# Patient Record
Sex: Male | Born: 1960 | Race: White | Hispanic: No | Marital: Married | Smoking: Never smoker
Health system: Southern US, Community
[De-identification: ages and names within clinical notes are randomized; demographics above are authoritative.]

## PROBLEM LIST (undated history)

## (undated) DIAGNOSIS — H9193 Unspecified hearing loss, bilateral: Secondary | ICD-10-CM

## (undated) DIAGNOSIS — K759 Inflammatory liver disease, unspecified: Secondary | ICD-10-CM

## (undated) DIAGNOSIS — I4901 Ventricular fibrillation: Secondary | ICD-10-CM

## (undated) DIAGNOSIS — I119 Hypertensive heart disease without heart failure: Secondary | ICD-10-CM

## (undated) DIAGNOSIS — R001 Bradycardia, unspecified: Secondary | ICD-10-CM

## (undated) DIAGNOSIS — K760 Fatty (change of) liver, not elsewhere classified: Secondary | ICD-10-CM

## (undated) DIAGNOSIS — I2101 ST elevation (STEMI) myocardial infarction involving left main coronary artery: Secondary | ICD-10-CM

## (undated) DIAGNOSIS — I10 Essential (primary) hypertension: Secondary | ICD-10-CM

## (undated) DIAGNOSIS — G4733 Obstructive sleep apnea (adult) (pediatric): Secondary | ICD-10-CM

## (undated) DIAGNOSIS — C801 Malignant (primary) neoplasm, unspecified: Secondary | ICD-10-CM

## (undated) DIAGNOSIS — T8859XA Other complications of anesthesia, initial encounter: Secondary | ICD-10-CM

## (undated) DIAGNOSIS — F419 Anxiety disorder, unspecified: Secondary | ICD-10-CM

## (undated) DIAGNOSIS — E119 Type 2 diabetes mellitus without complications: Secondary | ICD-10-CM

## (undated) DIAGNOSIS — Z87442 Personal history of urinary calculi: Secondary | ICD-10-CM

## (undated) DIAGNOSIS — E785 Hyperlipidemia, unspecified: Secondary | ICD-10-CM

## (undated) HISTORY — DX: Obstructive sleep apnea (adult) (pediatric): G47.33

## (undated) HISTORY — DX: Type 2 diabetes mellitus without complications: E11.9

## (undated) HISTORY — DX: Unspecified hearing loss, bilateral: H91.93

## (undated) HISTORY — PX: HERNIA REPAIR: SHX51

## (undated) HISTORY — DX: Essential (primary) hypertension: I10

## (undated) HISTORY — DX: Hypertensive heart disease without heart failure: I11.9

## (undated) HISTORY — DX: Hyperlipidemia, unspecified: E78.5

## (undated) HISTORY — DX: Bradycardia, unspecified: R00.1

## (undated) HISTORY — DX: Ventricular fibrillation: I49.01

## (undated) HISTORY — PX: OTHER SURGICAL HISTORY: SHX169

## (undated) HISTORY — DX: ST elevation (STEMI) myocardial infarction involving left main coronary artery: I21.01

---

## 1998-05-30 HISTORY — PX: NASAL SINUS SURGERY: SHX719

## 2007-10-10 ENCOUNTER — Encounter: Admission: RE | Admit: 2007-10-10 | Discharge: 2007-10-10 | Payer: Self-pay | Admitting: Emergency Medicine

## 2008-12-19 IMAGING — CR DG ANKLE COMPLETE 3+V*R*
3 series · 3 of 3 positions shown · non-contrast
Comparison: None

CLINICAL DATA: 2-week history of pain.

RIGHT ANKLE - COMPLETE 3+ VIEW

[t ankle joint ap right]
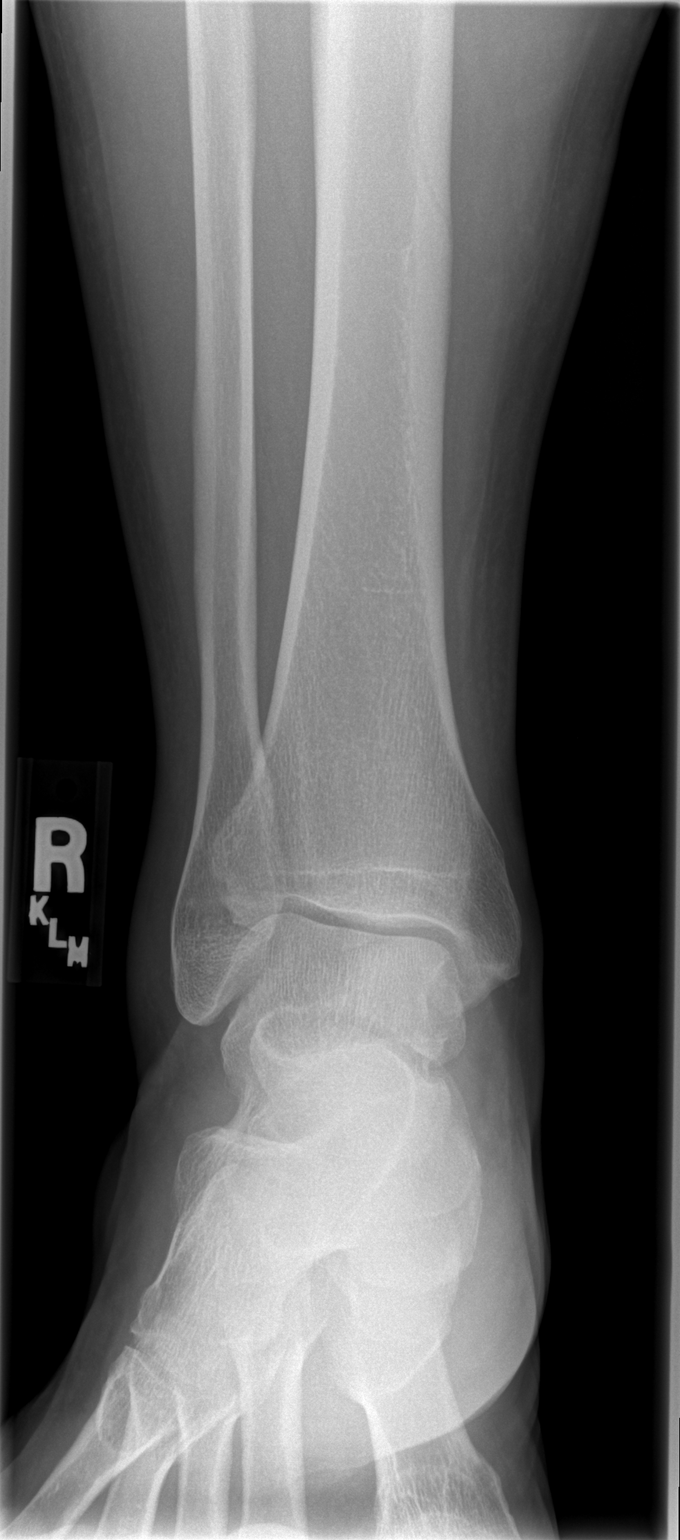

[t ankle joint oblique right]
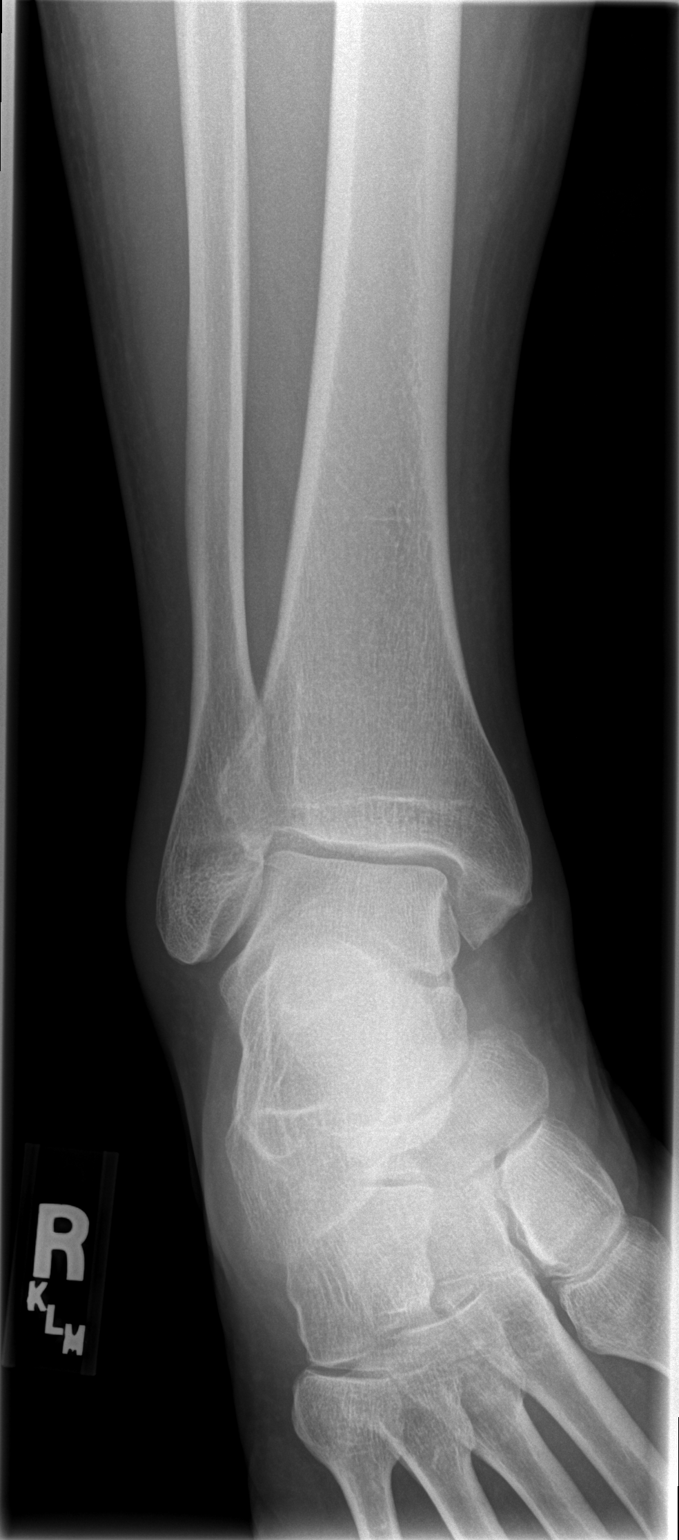

[t ankle joint lat right]
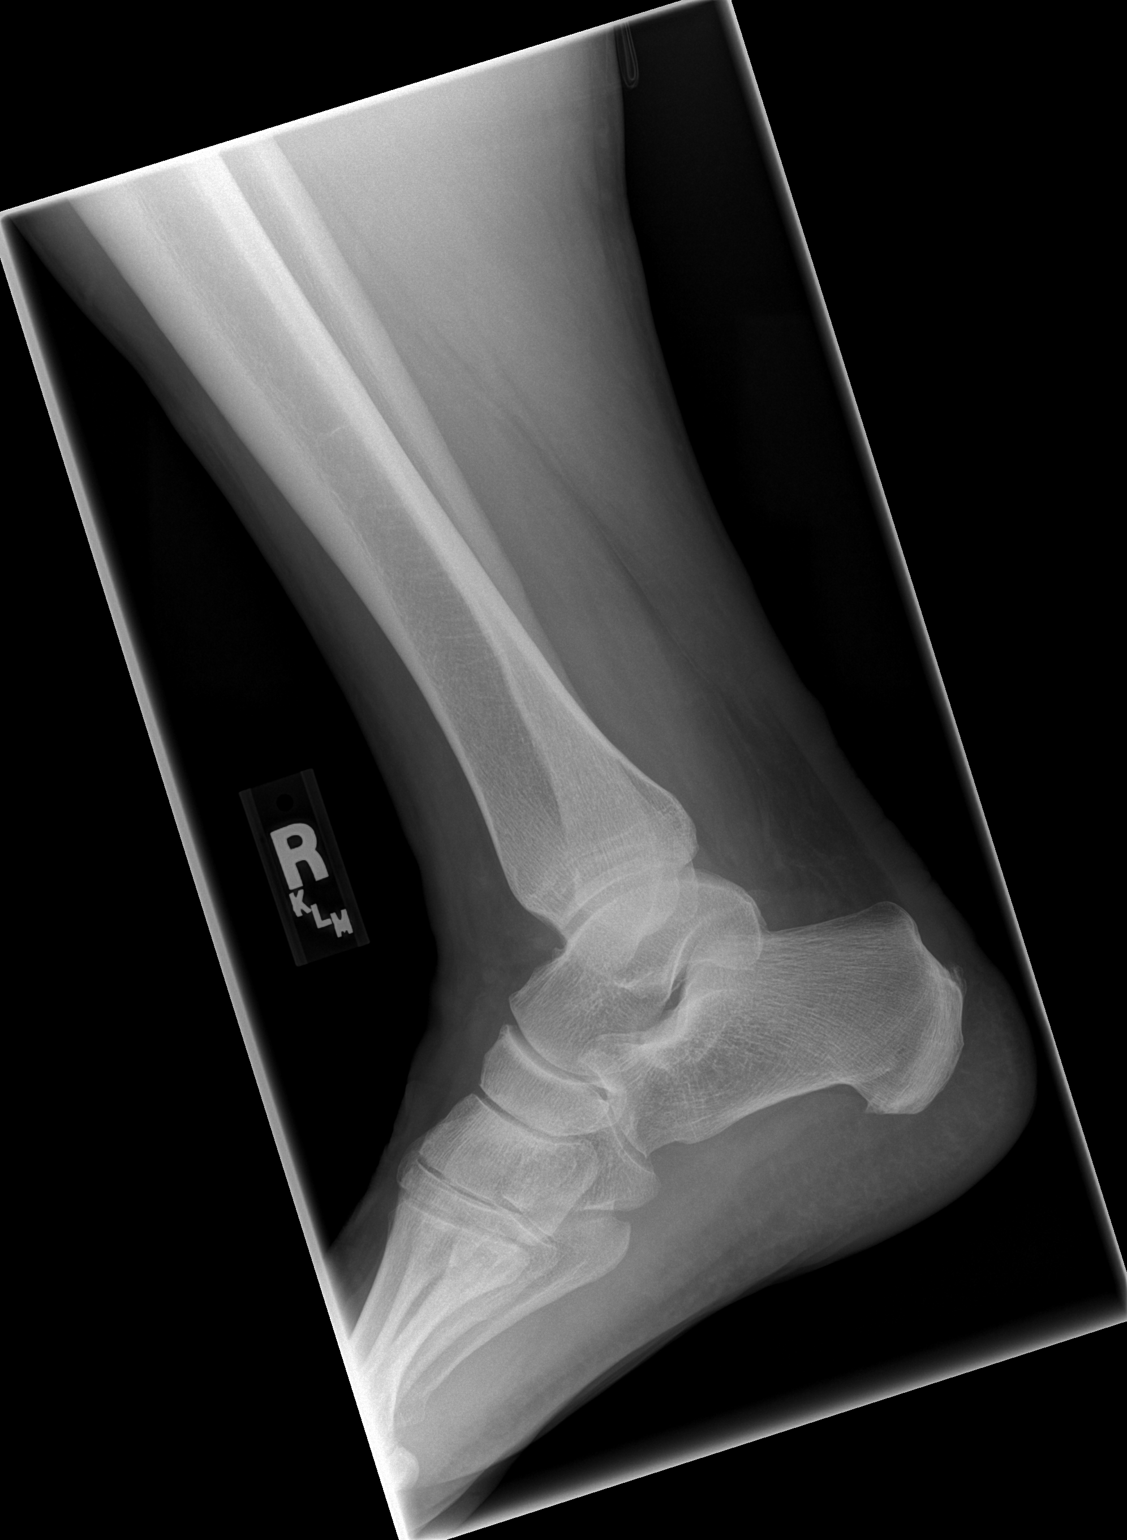

[3 of 3 positions shown; findings below may reference images not displayed]

FINDINGS: No evidence for acute fracture.  No subluxation or
dislocation.  Ankle mortise is preserved.  Mild soft tissue
swelling is evident.
IMPRESSION: No acute bony findings.

## 2011-04-19 ENCOUNTER — Telehealth: Payer: Self-pay | Admitting: *Deleted

## 2011-04-19 ENCOUNTER — Ambulatory Visit (AMBULATORY_SURGERY_CENTER): Payer: Managed Care, Other (non HMO) | Admitting: *Deleted

## 2011-04-19 DIAGNOSIS — Z1211 Encounter for screening for malignant neoplasm of colon: Secondary | ICD-10-CM

## 2011-04-19 NOTE — Telephone Encounter (Signed)
Pt arrived for PV today for screening colonoscopy with Dr. Christella Hartigan.  Pt was seen by GI doctor 4 months ago in [place], [place].  I explained our policy about pt's that have seen another GI MD within the last year.  Pt wants to come to our office because his wife is more familiar with [place] than [place].  Pt will decide if he wants to still switch to our practice after talking to his wife.  He will come to our office and sign release for records for MD in [place] after he makes his decision.  I explained that Dr. Christella Hartigan would have to review records and agree to take him as a patient. [name]

## 2011-05-06 ENCOUNTER — Other Ambulatory Visit: Payer: Self-pay | Admitting: Gastroenterology

## 2013-05-02 ENCOUNTER — Ambulatory Visit: Payer: Managed Care, Other (non HMO) | Admitting: Cardiology

## 2014-05-27 ENCOUNTER — Encounter: Payer: Self-pay | Admitting: *Deleted

## 2014-05-30 HISTORY — PX: OTHER SURGICAL HISTORY: SHX169

## 2016-02-28 HISTORY — PX: CHOLECYSTECTOMY: SHX55

## 2017-05-30 DIAGNOSIS — I2510 Atherosclerotic heart disease of native coronary artery without angina pectoris: Secondary | ICD-10-CM

## 2017-05-30 HISTORY — DX: Atherosclerotic heart disease of native coronary artery without angina pectoris: I25.10

## 2017-05-30 HISTORY — PX: CARDIAC CATHETERIZATION: SHX172

## 2017-08-12 DIAGNOSIS — I252 Old myocardial infarction: Secondary | ICD-10-CM | POA: Insufficient documentation

## 2017-08-12 HISTORY — DX: Old myocardial infarction: I25.2

## 2017-08-13 DIAGNOSIS — I25118 Atherosclerotic heart disease of native coronary artery with other forms of angina pectoris: Secondary | ICD-10-CM | POA: Insufficient documentation

## 2019-07-07 DIAGNOSIS — R55 Syncope and collapse: Secondary | ICD-10-CM | POA: Diagnosis not present

## 2019-07-07 DIAGNOSIS — R079 Chest pain, unspecified: Secondary | ICD-10-CM | POA: Diagnosis not present

## 2019-07-29 ENCOUNTER — Other Ambulatory Visit: Payer: Self-pay

## 2019-07-30 ENCOUNTER — Other Ambulatory Visit: Payer: Self-pay

## 2019-07-30 MED ORDER — CARVEDILOL 6.25 MG PO TABS: 6.2500 mg | ORAL_TABLET | Freq: Two times a day (BID) | ORAL | 0 refills | Status: DC

## 2019-08-01 ENCOUNTER — Other Ambulatory Visit: Payer: Self-pay

## 2019-08-01 MED ORDER — CARVEDILOL 6.25 MG PO TABS: 6.2500 mg | ORAL_TABLET | Freq: Two times a day (BID) | ORAL | 0 refills | Status: DC

## 2019-08-05 ENCOUNTER — Other Ambulatory Visit: Payer: Self-pay

## 2019-08-05 ENCOUNTER — Ambulatory Visit (INDEPENDENT_AMBULATORY_CARE_PROVIDER_SITE_OTHER): Payer: Managed Care, Other (non HMO) | Admitting: Family Medicine

## 2019-08-05 ENCOUNTER — Encounter: Payer: Self-pay | Admitting: Family Medicine

## 2019-08-05 VITALS — BP 118/70 | HR 56 | Temp 96.2°F | Resp 16 | Ht 69.0 in | Wt 266.0 lb

## 2019-08-05 DIAGNOSIS — F330 Major depressive disorder, recurrent, mild: Secondary | ICD-10-CM

## 2019-08-05 DIAGNOSIS — E782 Mixed hyperlipidemia: Secondary | ICD-10-CM | POA: Insufficient documentation

## 2019-08-05 DIAGNOSIS — I119 Hypertensive heart disease without heart failure: Secondary | ICD-10-CM | POA: Diagnosis not present

## 2019-08-05 DIAGNOSIS — E1121 Type 2 diabetes mellitus with diabetic nephropathy: Secondary | ICD-10-CM | POA: Diagnosis not present

## 2019-08-05 DIAGNOSIS — E66812 Obesity, class 2: Secondary | ICD-10-CM

## 2019-08-05 DIAGNOSIS — Z6837 Body mass index (BMI) 37.0-37.9, adult: Secondary | ICD-10-CM | POA: Insufficient documentation

## 2019-08-05 DIAGNOSIS — E038 Other specified hypothyroidism: Secondary | ICD-10-CM

## 2019-08-05 DIAGNOSIS — I10 Essential (primary) hypertension: Secondary | ICD-10-CM | POA: Insufficient documentation

## 2019-08-05 DIAGNOSIS — Z6839 Body mass index (BMI) 39.0-39.9, adult: Secondary | ICD-10-CM

## 2019-08-05 HISTORY — DX: Morbid (severe) obesity due to excess calories: E66.01

## 2019-08-05 NOTE — Assessment & Plan Note (Signed)
Well controlled.  ?No changes to medicines.  ?Continue to work on eating a healthy diet and exercise.  ?Labs drawn today.  ?

## 2019-08-05 NOTE — Patient Instructions (Addendum)
Well controlled.  No changes to medicines.  Work on eating a healthy diet and exercise. Start back on Noom! Continue to check sugars daily Follow up for shingrix vaccinee in 08/2019. Labs drawn today.   Mixed hyperlipidemia Well controlled.  No changes to medicines.  Continue to work on eating a healthy diet and exercise.  Labs drawn today.

## 2019-08-05 NOTE — Progress Notes (Addendum)
Subjective:  Patient ID: Gerald Jordan, male    DOB: Oct 25, 1960  Age: 59 y.o. MRN: [id number]  Chief Complaint  Patient presents with  . Diabetes  . Hypothyroidism  . Hyperlipidemia    Diabetes He presents for his follow-up diabetic visit. He has type 2 diabetes mellitus. His disease course has been stable. Pertinent negatives for hypoglycemia include no dizziness, headaches or nervousness/anxiousness. Pertinent negatives for diabetes include no chest pain, no fatigue, no polydipsia, no polyphagia and no polyuria.  Hyperlipidemia This is a chronic problem. The current episode started more than 1 year ago. The problem is controlled. Exacerbating diseases include diabetes and hypothyroidism. Pertinent negatives include no chest pain or shortness of breath.    Diabetes: checking sugars daily.  Coronary artery disease treated with stent, statin, betablocker, and aspirin.  Patient was admitted to [facility] in early February for a near syncopal episode. CT Brain normal, Carotid dopplers normal, echo normal. Stroke was ruled out. He has had no further symptoms.  Social History   Socioeconomic History  . Marital status: Married    Spouse name: Not on file  . Number of children: Not on file  . Years of education: Not on file  . Highest education level: Not on file  Occupational History  . Not on file  Tobacco Use  . Smoking status: Never Smoker  . Smokeless tobacco: Never Used  Substance and Sexual Activity  . Alcohol use: Not Currently  . Drug use: No  . Sexual activity: Not on file  Other Topics Concern  . Not on file  Social History Narrative  . Not on file   Social Determinants of Health   Financial Resource Strain:   . Difficulty of Paying Living Expenses:   Food Insecurity:   . Worried About Charity fundraiser in the Last Year:   . Arboriculturist in the Last Year:   Transportation Needs:   . Film/video editor (Medical):   Marland Kitchen Lack of Transportation  (Non-Medical):   Physical Activity:   . Days of Exercise per Week:   . Minutes of Exercise per Session:   Stress:   . Feeling of Stress :   Social Connections:   . Frequency of Communication with Friends and Family:   . Frequency of Social Gatherings with Friends and Family:   . Attends Religious Services:   . Active Member of Clubs or Organizations:   . Attends Archivist Meetings:   Marland Kitchen Marital Status:    Past Medical History:  Diagnosis Date  . Atherosclerotic heart disease of native coronary artery without angina pectoris   . Bradycardia   . Hyperlipidemia   . Hypertension   . Hypertensive heart disease without heart failure   . Morbid obesity ([place]) 08/05/2019  . 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 Obstructive sleep apnea   . Type 2 diabetes mellitus ([place])   . Unspecified hearing loss, bilateral   . Ventricular fibrillation (HCC)    Family History  Problem Relation Age of Onset  . Breast cancer Mother   . Melanoma Father   . Diabetes Sister   . Arrhythmia Brother   . Congestive Heart Failure Brother   . Heart attack Brother     Review of Systems  Constitutional: Negative for chills, fatigue and fever.  HENT: Negative for congestion, rhinorrhea and sore throat.   Respiratory: Negative for cough, chest tightness and shortness of breath.   Cardiovascular:  Negative for chest pain.  Gastrointestinal: Negative for abdominal pain, constipation, diarrhea, nausea and vomiting.  Endocrine: Negative for polydipsia, polyphagia and polyuria.  Genitourinary: Negative for dysuria and urgency.  Neurological: Negative for dizziness and headaches.  Psychiatric/Behavioral: Positive for dysphoric mood. The patient is not nervous/anxious.        His brother in law passed. His best friend passed from [place] 19. He does not wish to change his medicines.     Objective:  BP 118/70   Pulse (!) 56   Temp (!) 96.2 F (35.7 C)   Resp 16   Ht 5\' 9"   (1.753 m)   Wt 266 lb (120.7 kg)   BMI 39.28 kg/m   BP/Weight [id number]  Systolic BP 123456  Diastolic BP 70  Wt. (Lbs) 266  BMI 39.28    Physical Exam Vitals reviewed.  Constitutional:      Appearance: Normal appearance.  Neck:     Vascular: No carotid bruit.  Cardiovascular:     Rate and Rhythm: Normal rate and regular rhythm.     Pulses: Normal pulses.     Heart sounds: Normal heart sounds.  Pulmonary:     Effort: Pulmonary effort is normal.     Breath sounds: Normal breath sounds. No wheezing, rhonchi or rales.  Abdominal:     General: Bowel sounds are normal.     Palpations: Abdomen is soft.     Tenderness: There is no abdominal tenderness.  Neurological:     Mental Status: He is alert.  Psychiatric:        Mood and Affect: Mood normal.        Behavior: Behavior normal.    Diabetic Foot Exam - Simple   Simple Foot Form Diabetic Foot exam was performed with the following findings: Yes 08/05/2019 12:55 PM  Visual Inspection No deformities, no ulcerations, no other skin breakdown bilaterally: Yes Sensation Testing Intact to touch and monofilament testing bilaterally: Yes Pulse Check Posterior Tibialis and Dorsalis pulse intact bilaterally: Yes Comments    Fall Risk  08/05/2019  Falls in the past year? 0  Number falls in past yr: 0  Injury with Fall? 0   Lab Results  Component Value Date   WBC 7.0 08/05/2019   HGB 15.3 08/05/2019   HCT 45.0 08/05/2019   PLT 165 08/05/2019   GLUCOSE 111 (H) 08/05/2019   CHOL 97 (L) 08/05/2019   TRIG 72 08/05/2019   HDL 41 08/05/2019   LDLCALC 41 08/05/2019   AST 28 08/05/2019   NA 141 08/05/2019   K 4.5 08/05/2019   CL 100 08/05/2019   CREATININE 0.93 08/05/2019   BUN 11 08/05/2019   TSH 2.540 08/05/2019   HGBA1C 5.8 (H) 08/05/2019      Assessment & Plan:   1. Mixed hyperlipidemia Well controlled.  No changes to medicines.  Continue to work on eating a healthy diet and exercise.  Labs drawn today.  - Lipid  panel  2. Hypertensive heart disease without heart failure Well controlled.  No changes to medicines.  Continue to work on eating a healthy diet and exercise.  Labs drawn today.  - CBC with Differential/Platelet - Comp. Metabolic Panel (12) Follow up with cardiology regularly. 3. 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 Recommend check feet daily. Recommend annual eye exams. Medicines: none Continue to work on eating a healthy diet and exercise.  Labs drawn today.   - Hemoglobin A1c  4. Secondary hypothyroidism The current medical regimen  is effective;  continue present plan and medications. - TSH  5. Class 2 severe obesity due to excess calories with serious comorbidity and body mass index (BMI) of 39.0 to 39.9 in adult [facility]) Needs improvement.   Continue to work on eating a healthy diet and exercise.   6. Mild recurrent major depression. No changes to medications recommended. Pt is usually well controlled, but has recently been complicated by grief due to death of friends/relatives.   Follow-up: Return in about 3 months (around 11/05/2019) for chronic fasting appointment. May wish to schedule shingrix vaccine April.  AVS was given to patient prior to departure.  [facility] [phone]

## 2019-08-07 LAB — COMP. METABOLIC PANEL (12)
AST: 28 IU/L (ref 0–40)
Albumin/Globulin Ratio: 3.2 — ABNORMAL HIGH (ref 1.2–2.2)
Albumin: 4.8 g/dL (ref 3.8–4.9)
Alkaline Phosphatase: 74 IU/L (ref 39–117)
BUN/Creatinine Ratio: 12 (ref 9–20)
BUN: 11 mg/dL (ref 6–24)
Bilirubin Total: 0.8 mg/dL (ref 0.0–1.2)
Calcium: 9.6 mg/dL (ref 8.7–10.2)
Chloride: 100 mmol/L (ref 96–106)
Creatinine, Ser: 0.93 mg/dL (ref 0.76–1.27)
GFR calc Af Amer: 104 mL/min/{1.73_m2} (ref >59)
GFR calc non Af Amer: 90 mL/min/{1.73_m2} (ref >59)
Globulin, Total: 1.5 g/dL (ref 1.5–4.5)
Glucose: 111 mg/dL — ABNORMAL HIGH (ref 65–99)
Potassium: 4.5 mmol/L (ref 3.5–5.2)
Sodium: 141 mmol/L (ref 134–144)
Total Protein: 6.3 g/dL (ref 6.0–8.5)

## 2019-08-07 LAB — CBC WITH DIFFERENTIAL/PLATELET
Basophils Absolute: 0 10*3/uL (ref 0.0–0.2)
Basos: 0 %
EOS (ABSOLUTE): 0.1 10*3/uL (ref 0.0–0.4)
Eos: 1 %
Hematocrit: 45 % (ref 37.5–51.0)
Hemoglobin: 15.3 g/dL (ref 13.0–17.7)
Immature Grans (Abs): 0 10*3/uL (ref 0.0–0.1)
Immature Granulocytes: 1 %
Lymphocytes Absolute: 2.5 10*3/uL (ref 0.7–3.1)
Lymphs: 36 %
MCH: 31 pg (ref 26.6–33.0)
MCHC: 34 g/dL (ref 31.5–35.7)
MCV: 91 fL (ref 79–97)
Monocytes Absolute: 0.8 10*3/uL (ref 0.1–0.9)
Monocytes: 11 %
Neutrophils Absolute: 3.6 10*3/uL (ref 1.4–7.0)
Neutrophils: 51 %
Platelets: 165 10*3/uL (ref 150–450)
RBC: 4.93 x10E6/uL (ref 4.14–5.80)
RDW: 12.3 % (ref 11.6–15.4)
WBC: 7 10*3/uL (ref 3.4–10.8)

## 2019-08-07 LAB — LIPID PANEL
Chol/HDL Ratio: 2.4 ratio (ref 0.0–5.0)
Cholesterol, Total: 97 mg/dL — ABNORMAL LOW (ref 100–199)
HDL: 41 mg/dL (ref >39)
LDL Chol Calc (NIH): 41 mg/dL (ref 0–99)
Triglycerides: 72 mg/dL (ref 0–149)
VLDL Cholesterol Cal: 15 mg/dL (ref 5–40)

## 2019-08-07 LAB — CARDIOVASCULAR RISK ASSESSMENT

## 2019-08-07 LAB — HEMOGLOBIN A1C
Est. average glucose Bld gHb Est-mCnc: 120 mg/dL
Hgb A1c MFr Bld: 5.8 % — ABNORMAL HIGH (ref 4.8–5.6)

## 2019-08-07 LAB — TSH: TSH: 2.54 u[IU]/mL (ref 0.450–4.500)

## 2019-08-25 ENCOUNTER — Encounter: Payer: Self-pay | Admitting: Family Medicine

## 2019-08-25 DIAGNOSIS — F3181 Bipolar II disorder: Secondary | ICD-10-CM | POA: Insufficient documentation

## 2019-08-25 DIAGNOSIS — E079 Disorder of thyroid, unspecified: Secondary | ICD-10-CM | POA: Insufficient documentation

## 2019-08-25 DIAGNOSIS — K760 Fatty (change of) liver, not elsewhere classified: Secondary | ICD-10-CM | POA: Insufficient documentation

## 2019-08-25 DIAGNOSIS — F330 Major depressive disorder, recurrent, mild: Secondary | ICD-10-CM | POA: Insufficient documentation

## 2019-08-25 DIAGNOSIS — G4733 Obstructive sleep apnea (adult) (pediatric): Secondary | ICD-10-CM | POA: Insufficient documentation

## 2019-08-26 ENCOUNTER — Other Ambulatory Visit: Payer: Self-pay | Admitting: Family Medicine

## 2019-08-29 ENCOUNTER — Other Ambulatory Visit: Payer: Self-pay | Admitting: Family Medicine

## 2019-09-03 ENCOUNTER — Telehealth (INDEPENDENT_AMBULATORY_CARE_PROVIDER_SITE_OTHER): Payer: Managed Care, Other (non HMO) | Admitting: Nurse Practitioner

## 2019-09-03 ENCOUNTER — Encounter: Payer: Self-pay | Admitting: Nurse Practitioner

## 2019-09-03 VITALS — BP 125/64 | Temp 100.0°F | Ht 69.0 in

## 2019-09-03 DIAGNOSIS — J0100 Acute maxillary sinusitis, unspecified: Secondary | ICD-10-CM | POA: Insufficient documentation

## 2019-09-03 DIAGNOSIS — J018 Other acute sinusitis: Secondary | ICD-10-CM | POA: Diagnosis not present

## 2019-09-03 DIAGNOSIS — J019 Acute sinusitis, unspecified: Secondary | ICD-10-CM | POA: Insufficient documentation

## 2019-09-03 MED ORDER — ACETAMINOPHEN 500 MG PO TABS: 500.0000 mg | ORAL_TABLET | Freq: Four times a day (QID) | ORAL | 0 refills | Status: DC | PRN

## 2019-09-03 MED ORDER — SALINE SPRAY 0.65 % NA SOLN: 1.0000 | NASAL | 1 refills | Status: DC | PRN

## 2019-09-03 MED ORDER — DM-GUAIFENESIN ER 30-600 MG PO TB12: 1.0000 | ORAL_TABLET | Freq: Two times a day (BID) | ORAL | 0 refills | Status: DC

## 2019-09-03 MED ORDER — AZITHROMYCIN 500 MG PO TABS: 500.0000 mg | ORAL_TABLET | Freq: Every day | ORAL | 0 refills | Status: DC

## 2019-09-03 NOTE — Assessment & Plan Note (Addendum)
patients sinus infection is not well controlled . Azithromycin 500 mg daily started for sinus infection, ocean nasal spray  for nasal congestion, tylenol for low grade fever. Pt knows to call with worsening symptoms.

## 2019-09-03 NOTE — Progress Notes (Addendum)
Virtual Visit via Telephone Note   This visit type was conducted due to national recommendations for restrictions regarding the COVID-19 Pandemic (e.g. social distancing) in an effort to limit this patient's exposure and mitigate transmission in our community.  Due to his co-morbid illnesses, this patient is at least at moderate risk for complications without adequate follow up.  This format is felt to be most appropriate for this patient at this time.  The patient did not have access to video technology/had technical difficulties with video requiring transitioning to audio format only (telephone).  All issues noted in this document were discussed and addressed.  No physical exam could be performed with this format.  Patient verbally consented to a telehealth visit.   Date:  09/18/2019   ID:  Gerald Jordan, DOB 09/10/1960, MRN [id number]   Patient location: in the car Provider location: office  PCP:  Rochel Brome, MD   Evaluation Performed:  Established patient encounter  Chief Complaint: Patient  is a 59 year old male.  He is evaluated for sinus infection.The patient's medications were reviewed and reconciled since the patient's last visit.  History details were provided by the patient. The history appears to be reliable.    History of Present Illness:    Gerald Jordan is a 59 y.o. male with Sinusitis This is a recurrent problem. The current episode started in the past 7 days. The problem has been gradually worsening since onset. There has been no fever. His pain is at a severity of 5/10. The pain is moderate. Associated symptoms include congestion, coughing, headaches, sinus pressure and a sore throat. Pertinent negatives include no chills, diaphoresis, ear pain, hoarse voice, neck pain, shortness of breath, sneezing or swollen glands. Past treatments include oral decongestants. The treatment provided mild relief.   The patient doesn't have Covid-19 he received his second covid-19  vaccine march 15th   Past Medical History:  Diagnosis Date  . Atherosclerotic heart disease of native coronary artery without angina pectoris 2019   Ventricular fibrillation - cardioverted x 3 in ambulance. PTCA LAD 100%  . Bradycardia   . Hyperlipidemia   . Hypertension   . Hypertensive heart disease without heart failure   . Morbid obesity ([place]) 08/05/2019  . 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 Obstructive sleep apnea   . Type 2 diabetes mellitus ([place])   . Unspecified hearing loss, bilateral   . Ventricular fibrillation [facility])    Past Surgical History:  Procedure Laterality Date  . arm surgery  05/2014  . CHOLECYSTECTOMY  02/2016  . HERNIA REPAIR     1999  . NASAL SINUS SURGERY  2000  . tendon repair 11/2014      Family History  Problem Relation Age of Onset  . Breast cancer Mother   . Melanoma Father   . Diabetes Sister   . Arrhythmia Brother   . Congestive Heart Failure Brother   . Heart attack Brother     Current Meds  Medication Sig  . aspirin 81 MG tablet Take 81 mg by mouth daily.  Marland Kitchen atorvastatin (LIPITOR) 80 MG tablet TAKE 1 TABLET DAILY  . carvedilol (COREG) 6.25 MG tablet Take 1 tablet (6.25 mg total) by mouth 2 (two) times daily.  . citalopram (CELEXA) 20 MG tablet Take 20 mg by mouth daily.  Marland Kitchen lamoTRIgine (LAMICTAL) 150 MG tablet Take 150 mg by mouth daily. Take two tablets once a day.  . latanoprost (XALATAN) 0.005 % ophthalmic  solution Place 1 drop into both eyes nightly.  . levothyroxine (SYNTHROID) 112 MCG tablet TAKE 1 TABLET DAILY  . metoprolol tartrate (LOPRESSOR) 25 MG tablet Take 25 mg by mouth 2 (two) times daily.  . nitroGLYCERIN (NITROSTAT) 0.4 MG SL tablet Place under the tongue.  Marland Kitchen telmisartan-hydrochlorothiazide (MICARDIS HCT) 80-25 MG tablet Take 1 tablet by mouth daily.     Allergies:   Penicillins and Lisinopril   Social History   Tobacco Use  . Smoking status: Never Smoker  . Smokeless tobacco: Never  Used  Substance Use Topics  . Alcohol use: Not Currently  . Drug use: No     Family Hx: The patient's family history includes Arrhythmia in his brother; Breast cancer in his mother; Congestive Heart Failure in his brother; Diabetes in his sister; Heart attack in his brother; Melanoma in his father.  ROS:   Please see the history of present illness.    Review of Systems  Constitutional: Negative for chills and diaphoresis.  HENT: Positive for congestion, sinus pressure and sore throat. Negative for ear pain, hoarse voice and sneezing.   Eyes: Negative for pain.  Respiratory: Positive for cough. Negative for shortness of breath.   Cardiovascular: Negative for chest pain and leg swelling.  Musculoskeletal: Negative for neck pain.  Neurological: Positive for headaches.  All other systems reviewed and are negative.  Labs/Other Tests and Data Reviewed:    Recent Labs: 08/05/2019: BUN 11; Creatinine, Ser 0.93; Hemoglobin 15.3; Platelets 165; Potassium 4.5; Sodium 141; TSH 2.540   Recent Lipid Panel Lab Results  Component Value Date/Time   CHOL 97 (L) 08/05/2019 09:25 AM   TRIG 72 08/05/2019 09:25 AM   HDL 41 08/05/2019 09:25 AM   CHOLHDL 2.4 08/05/2019 09:25 AM   LDLCALC 41 08/05/2019 09:25 AM    Wt Readings from Last 3 Encounters:  08/05/19 266 lb (120.7 kg)     Objective:    Vital Signs:  BP 125/64   Temp 100 F (37.8 C)   Ht 5\' 9"  (1.753 m)   BMI 39.28 kg/m      ASSESSMENT & PLAN:   Acute infection of nasal sinus patients sinus infection is not well controlled . Azithromycin 500 mg daily started for sinus infection, ocean nasal spray  for nasal congestion, tylenol for low grade fever. Pt knows to call with worsening symptoms.  Problem List Items Addressed This Visit      Respiratory   Acute infection of nasal sinus - Primary    patients sinus infection is not well controlled . Azithromycin 500 mg daily started for sinus infection, ocean nasal spray  for nasal  congestion, tylenol for low grade fever. Pt knows to call with worsening symptoms.      Relevant Medications   sodium chloride (OCEAN) 0.65 % SOLN nasal spray   dextromethorphan-guaiFENesin (MUCINEX DM) 30-600 MG 12hr tablet   azithromycin (ZITHROMAX) 500 MG tablet   acetaminophen (TYLENOL) 500 MG tablet      Acute infection of nasal sinus patients sinus infection is not well controlled . Azithromycin 500 mg daily started for sinus infection, ocean nasal spray  for nasal congestion, tylenol for low grade fever. Pt knows to call with worsening symptoms.   Meds ordered this encounter  Medications  . sodium chloride (OCEAN) 0.65 % SOLN nasal spray    Sig: Place 1 spray into both nostrils as needed for congestion.    Dispense:  60 mL    Refill:  1    Order  Specific Question:   Supervising Provider    AnswerRochel Brome [id number]  . dextromethorphan-guaiFENesin (MUCINEX DM) 30-600 MG 12hr tablet    Sig: Take 1 tablet by mouth 2 (two) times daily.    Dispense:  28 tablet    Refill:  0    Order Specific Question:   Supervising Provider    AnswerRochel Brome [id number]  . azithromycin (ZITHROMAX) 500 MG tablet    Sig: Take 1 tablet (500 mg total) by mouth daily.    Dispense:  3 tablet    Refill:  0    Order Specific Question:   Supervising Provider    AnswerRochel Brome [id number]  . acetaminophen (TYLENOL) 500 MG tablet    Sig: Take 1 tablet (500 mg total) by mouth every 6 (six) hours as needed.    Dispense:  30 tablet    Refill:  0    Order Specific Question:   Supervising Provider    AnswerShelton Silvas    COVID-19 Education: The signs and symptoms of COVID-19 were discussed with the patient and how to seek care for testing (follow up with PCP or arrange E-visit). The importance of social distancing was discussed today.  Time:   Today, I have spent 13 minutes with the patient with telehealth technology discussing the above problems.     Medication  Adjustments/Labs and Tests Ordered: Current medicines are reviewed at length with the patient today.  Concerns regarding medicines are outlined above.   Tests Ordered: No orders of the defined types were placed in this encounter.   Medication Changes: Meds ordered this encounter  Medications  . sodium chloride (OCEAN) 0.65 % SOLN nasal spray    Sig: Place 1 spray into both nostrils as needed for congestion.    Dispense:  60 mL    Refill:  1    Order Specific Question:   Supervising Provider    AnswerRochel Brome [id number]  . dextromethorphan-guaiFENesin (MUCINEX DM) 30-600 MG 12hr tablet    Sig: Take 1 tablet by mouth 2 (two) times daily.    Dispense:  28 tablet    Refill:  0    Order Specific Question:   Supervising Provider    AnswerRochel Brome [id number]  . azithromycin (ZITHROMAX) 500 MG tablet    Sig: Take 1 tablet (500 mg total) by mouth daily.    Dispense:  3 tablet    Refill:  0    Order Specific Question:   Supervising Provider    AnswerRochel Brome [id number]  . acetaminophen (TYLENOL) 500 MG tablet    Sig: Take 1 tablet (500 mg total) by mouth every 6 (six) hours as needed.    Dispense:  30 tablet    Refill:  0    Order Specific Question:   Supervising Provider    AnswerShelton Silvas    Follow Up:  Follow up as needed for unresolved or worsening symptoms  Signed, Ivy Lynn, NP  09/18/2019 3:31 PM    [place]

## 2019-09-06 ENCOUNTER — Ambulatory Visit: Payer: Managed Care, Other (non HMO)

## 2019-09-13 ENCOUNTER — Other Ambulatory Visit: Payer: Self-pay

## 2019-09-13 ENCOUNTER — Ambulatory Visit (INDEPENDENT_AMBULATORY_CARE_PROVIDER_SITE_OTHER): Payer: Managed Care, Other (non HMO)

## 2019-09-13 DIAGNOSIS — Z23 Encounter for immunization: Secondary | ICD-10-CM

## 2019-09-13 NOTE — Progress Notes (Signed)
Patient came in today for his second shingles vaccine.  Shigrix was given in the left deltoid.  Kiven tolerated injected well.

## 2019-10-10 ENCOUNTER — Encounter: Payer: Self-pay | Admitting: Family Medicine

## 2019-10-10 ENCOUNTER — Telehealth (INDEPENDENT_AMBULATORY_CARE_PROVIDER_SITE_OTHER): Payer: Managed Care, Other (non HMO) | Admitting: Family Medicine

## 2019-10-10 VITALS — Temp 101.1°F | Ht 69.0 in

## 2019-10-10 DIAGNOSIS — J018 Other acute sinusitis: Secondary | ICD-10-CM | POA: Diagnosis not present

## 2019-10-10 DIAGNOSIS — R509 Fever, unspecified: Secondary | ICD-10-CM | POA: Diagnosis not present

## 2019-10-10 LAB — POC COVID19 BINAXNOW: SARS Coronavirus 2 Ag: NEGATIVE

## 2019-10-10 MED ORDER — AZITHROMYCIN 250 MG PO TABS: ORAL_TABLET | ORAL | 0 refills | Status: DC

## 2019-10-10 NOTE — Progress Notes (Signed)
Virtual Visit via Video Note   This visit type was conducted due to national recommendations for restrictions regarding the COVID-19 Pandemic (e.g. social distancing) in an effort to limit this patient's exposure and mitigate transmission in our community.  Due to his co-morbid illnesses, this patient is at least at moderate risk for complications without adequate follow up.  This format is felt to be most appropriate for this patient at this time.  All issues noted in this document were discussed and addressed.  A limited physical exam was performed with this format.  A verbal consent was obtained for the virtual visit.   Patient Location: Home Provider Location: Office  Acute Office Visit  Subjective:    Patient ID: Gerald Jordan, male    DOB: Mar 16, 1961, 59 y.o.   MRN: [id number]  Chief Complaint  Patient presents with  . URI    x 2 days patient took tylenol for fever today.    HPI Patient is in today for sinus congestion, sinus headache, coughing, and temperature (101.1) x 2 days. No earaches, chest pain, sob, sore throat, achy. Patient has had covid 19 vaccine pfizer March. No exposure now.   Past Medical History:  Diagnosis Date  . Atherosclerotic heart disease of native coronary artery without angina pectoris 2019   Ventricular fibrillation - cardioverted x 3 in ambulance. PTCA LAD 100%  . Bradycardia   . Hyperlipidemia   . Hypertension   . Hypertensive heart disease without heart failure   . Morbid obesity ([place]) 08/05/2019  . Myocardial infarction involving left main coronary artery ([place])   . Obstructive sleep apnea   . Type 2 diabetes mellitus ([place])   . Unspecified hearing loss, bilateral   . Ventricular fibrillation [facility])     Past Surgical History:  Procedure Laterality Date  . arm surgery  05/2014  . CHOLECYSTECTOMY  02/2016  . HERNIA REPAIR     1999  . NASAL SINUS SURGERY  2000  . tendon repair 11/2014      Family History  Problem Relation Age of  Onset  . Breast cancer Mother   . Melanoma Father   . Diabetes Sister   . Arrhythmia Brother   . Congestive Heart Failure Brother   . Heart attack Brother     Social History   Socioeconomic History  . Marital status: Married    Spouse name: Not on file  . Number of children: Not on file  . Years of education: Not on file  . Highest education level: Not on file  Occupational History  . Not on file  Tobacco Use  . Smoking status: Never Smoker  . Smokeless tobacco: Never Used  Substance and Sexual Activity  . Alcohol use: Not Currently  . Drug use: No  . Sexual activity: Not on file  Other Topics Concern  . Not on file  Social History Narrative  . Not on file   Social Determinants of Health   Financial Resource Strain:   . Difficulty of Paying Living Expenses:   Food Insecurity:   . Worried About Charity fundraiser in the Last Year:   . Arboriculturist in the Last Year:   Transportation Needs:   . Film/video editor (Medical):   Marland Kitchen Lack of Transportation (Non-Medical):   Physical Activity:   . Days of Exercise per Week:   . Minutes of Exercise per Session:   Stress:   . Feeling of Stress :   Social Connections:   .  Frequency of Communication with Friends and Family:   . Frequency of Social Gatherings with Friends and Family:   . Attends Religious Services:   . Active Member of Clubs or Organizations:   . Attends Archivist Meetings:   Marland Kitchen Marital Status:   Intimate Partner Violence:   . Fear of Current or Ex-Partner:   . Emotionally Abused:   Marland Kitchen Physically Abused:   . Sexually Abused:     Outpatient Medications Prior to Visit  Medication Sig Dispense Refill  . acetaminophen (TYLENOL) 500 MG tablet Take 1 tablet (500 mg total) by mouth every 6 (six) hours as needed. 30 tablet 0  . aspirin 81 MG tablet Take 81 mg by mouth daily.    Marland Kitchen atorvastatin (LIPITOR) 80 MG tablet TAKE 1 TABLET DAILY 90 tablet 3  . carvedilol (COREG) 6.25 MG tablet Take 1  tablet (6.25 mg total) by mouth 2 (two) times daily. 14 tablet 0  . citalopram (CELEXA) 20 MG tablet Take 20 mg by mouth daily.    Marland Kitchen dextromethorphan-guaiFENesin (MUCINEX DM) 30-600 MG 12hr tablet Take 1 tablet by mouth 2 (two) times daily. 28 tablet 0  . lamoTRIgine (LAMICTAL) 150 MG tablet Take 150 mg by mouth daily. Take two tablets once a day.    . latanoprost (XALATAN) 0.005 % ophthalmic solution Place 1 drop into both eyes nightly.    . levothyroxine (SYNTHROID) 112 MCG tablet TAKE 1 TABLET DAILY 90 tablet 3  . metoprolol tartrate (LOPRESSOR) 25 MG tablet Take 25 mg by mouth 2 (two) times daily.    . nitroGLYCERIN (NITROSTAT) 0.4 MG SL tablet Place under the tongue.    . sodium chloride (OCEAN) 0.65 % SOLN nasal spray Place 1 spray into both nostrils as needed for congestion. 60 mL 1  . telmisartan-hydrochlorothiazide (MICARDIS HCT) 80-25 MG tablet Take 1 tablet by mouth daily.    Marland Kitchen azithromycin (ZITHROMAX) 500 MG tablet Take 1 tablet (500 mg total) by mouth daily. 3 tablet 0   No facility-administered medications prior to visit.    Allergies  Allergen Reactions  . Penicillins Nausea And Vomiting    Ulcers - tolerated Augmentin 09/2012 Mouth Ulcers - tolerated Augmentin 09/2012 Ulcers - tolerated Augmentin 09/2012   . Lisinopril Other (See Comments)    Coughing Coughing     Review of Systems  Constitutional: Positive for fatigue and fever (101.1). Negative for chills and diaphoresis.  HENT: Positive for congestion, postnasal drip, rhinorrhea, sinus pressure, sinus pain and sneezing. Negative for ear pain and sore throat.   Respiratory: Positive for cough. Negative for shortness of breath.   Cardiovascular: Negative for chest pain.  Gastrointestinal: Negative for constipation, diarrhea, nausea and vomiting.  Musculoskeletal: Negative for arthralgias and myalgias.  Neurological: Positive for headaches. Negative for dizziness.       Objective:    Physical Exam  Temp (!)  101.1 F (38.4 C)   Ht 5\' 9"  (1.753 m)   BMI 39.28 kg/m  Wt Readings from Last 3 Encounters:  08/05/19 266 lb (120.7 kg)    Health Maintenance Due  Topic Date Due  . Hepatitis C Screening  Never done  . OPHTHALMOLOGY EXAM  Never done  . HIV Screening  Never done  . COVID-19 Vaccine (1) Never done  . COLONOSCOPY  Never done    There are no preventive care reminders to display for this patient.   Lab Results  Component Value Date   TSH 2.540 08/05/2019   Lab Results  Component  Value Date   WBC 7.0 08/05/2019   HGB 15.3 08/05/2019   HCT 45.0 08/05/2019   MCV 91 08/05/2019   PLT 165 08/05/2019   Lab Results  Component Value Date   NA 141 08/05/2019   K 4.5 08/05/2019   GLUCOSE 111 (H) 08/05/2019   BUN 11 08/05/2019   CREATININE 0.93 08/05/2019   BILITOT 0.8 08/05/2019   ALKPHOS 74 08/05/2019   AST 28 08/05/2019   PROT 6.3 08/05/2019   ALBUMIN 4.8 08/05/2019   CALCIUM 9.6 08/05/2019   Lab Results  Component Value Date   CHOL 97 (L) 08/05/2019   Lab Results  Component Value Date   HDL 41 08/05/2019   Lab Results  Component Value Date   LDLCALC 41 08/05/2019   Lab Results  Component Value Date   TRIG 72 08/05/2019   Lab Results  Component Value Date   CHOLHDL 2.4 08/05/2019   Lab Results  Component Value Date   HGBA1C 5.8 (H) 08/05/2019       Assessment & Plan:  1. Fever of unknown origin - POC COVID-19:  Negative.  - Novel Coronavirus, NAA ([facility]) pending.   2) other sinusitis -treated with zpack.  Meds ordered this encounter  Medications  . azithromycin (ZITHROMAX) 250 MG tablet    Sig: Take 500 mg (2 tablets of 250 mg) the first day and 250 mg for the remaining four days    Dispense:  6 tablet    Refill:  0   Orders Placed This Encounter  Procedures  . Novel Coronavirus, NAA ([facility])  . POC COVID-19    Spent 10 minutes on phone.   Follow-up: Return if symptoms worsen or fail to improve.  An After Visit Summary was printed  and given to the patient.  Rochel Brome [facility] [phone]

## 2019-10-12 LAB — NOVEL CORONAVIRUS, NAA: SARS-CoV-2, NAA: NOT DETECTED

## 2019-10-13 DIAGNOSIS — R509 Fever, unspecified: Secondary | ICD-10-CM | POA: Insufficient documentation

## 2019-10-14 ENCOUNTER — Ambulatory Visit: Payer: Managed Care, Other (non HMO)

## 2019-10-29 ENCOUNTER — Other Ambulatory Visit: Payer: Self-pay

## 2019-10-29 MED ORDER — CITALOPRAM HYDROBROMIDE 20 MG PO TABS: 20.0000 mg | ORAL_TABLET | Freq: Every day | ORAL | 1 refills | Status: DC

## 2019-10-29 MED ORDER — LEVOTHYROXINE SODIUM 112 MCG PO TABS: 112.0000 ug | ORAL_TABLET | Freq: Every day | ORAL | 3 refills | Status: DC

## 2019-10-29 MED ORDER — TELMISARTAN-HCTZ 80-25 MG PO TABS: 1.0000 | ORAL_TABLET | Freq: Every day | ORAL | 0 refills | Status: DC

## 2019-11-05 ENCOUNTER — Other Ambulatory Visit: Payer: Self-pay | Admitting: Family Medicine

## 2019-11-05 ENCOUNTER — Other Ambulatory Visit: Payer: Self-pay

## 2019-11-05 MED ORDER — CARVEDILOL 6.25 MG PO TABS: 6.2500 mg | ORAL_TABLET | Freq: Two times a day (BID) | ORAL | 0 refills | Status: DC

## 2019-11-05 NOTE — Progress Notes (Signed)
Subjective:  Patient ID: Gerald Jordan, male    DOB: 18-Feb-1961  Age: 59 y.o. MRN: [id number]  Chief Complaint  Patient presents with  . Diabetes  . Hyperlipidemia   Diabetes He presents for his follow-up diabetic visit. He has type 2 diabetes mellitus. His disease course has been stable. Pertinent negatives for hypoglycemia include no dizziness, headaches or nervousness/anxiousness. Pertinent negatives for diabetes include no chest pain, no fatigue, no polydipsia, no polyphagia and no polyuria. Diabetes: checking sugars once every couple of weeks runs around 120's, eats healthy, does not exercise, but does perform daily foot exams. Patient sees the eye doctor every 3 months due to Glaucoma.  Hyperlipidemia This is a chronic problem. The current episode started more than 1 year ago. The problem is controlled. Exacerbating diseases include diabetes and hypothyroidism. Pertinent negatives include no chest pain or shortness of breath. Currently on Atorvastatin.   Hypertensive heart disease with no congestive heart failure: Patient is currently on carvedilol 625 mg one p.o. twice daily, telmisartan-hydrochlorothiazide 80/25 mg once daily aspirin 81 mg once daily.  He does not routinely take his nitroglycerin within everywhere.  In 2019 he had an AMI with Ventricular fibrillation and was cardioverted x 3 in ambulance. Subsequent cardiac cath resulted in PTCA LAD 100% Blockage.    Hypothyroidism: currently on levothyroxine 112 mcg once daily in am.   Anxiety and depression are being treated with lamictal and celexa.   Current Outpatient Medications on File Prior to Visit  Medication Sig Dispense Refill  . acetaminophen (TYLENOL) 500 MG tablet Take 1 tablet (500 mg total) by mouth every 6 (six) hours as needed. 30 tablet 0  . aspirin 81 MG tablet Take 81 mg by mouth daily.    Marland Kitchen atorvastatin (LIPITOR) 80 MG tablet TAKE 1 TABLET DAILY 90 tablet 3  . carvedilol (COREG) 6.25 MG tablet Take 1  tablet (6.25 mg total) by mouth 2 (two) times daily. 60 tablet 0  . citalopram (CELEXA) 20 MG tablet Take 1 tablet (20 mg total) by mouth daily. 90 tablet 1  . lamoTRIgine (LAMICTAL) 150 MG tablet Take 150 mg by mouth daily. Take two tablets once a day.    . latanoprost (XALATAN) 0.005 % ophthalmic solution Place 1 drop into both eyes nightly.    . levothyroxine (SYNTHROID) 112 MCG tablet Take 1 tablet (112 mcg total) by mouth daily. 90 tablet 3  . telmisartan-hydrochlorothiazide (MICARDIS HCT) 80-25 MG tablet Take 1 tablet by mouth daily. 90 tablet 0   No current facility-administered medications on file prior to visit.    Social History   Socioeconomic History  . Marital status: Married    Spouse name: Not on file  . Number of children: Not on file  . Years of education: Not on file  . Highest education level: Not on file  Occupational History  . Not on file  Tobacco Use  . Smoking status: Never Smoker  . Smokeless tobacco: Never Used  Substance and Sexual Activity  . Alcohol use: Not Currently  . Drug use: No  . Sexual activity: Not on file  Other Topics Concern  . Not on file  Social History Narrative  . Not on file   Social Determinants of Health   Financial Resource Strain:   . Difficulty of Paying Living Expenses:   Food Insecurity:   . Worried About Charity fundraiser in the Last Year:   . Arboriculturist in the Last Year:   [employer]  Needs:   . Lack of Transportation (Medical):   Marland Kitchen Lack of Transportation (Non-Medical):   Physical Activity:   . Days of Exercise per Week:   . Minutes of Exercise per Session:   Stress:   . Feeling of Stress :   Social Connections:   . Frequency of Communication with Friends and Family:   . Frequency of Social Gatherings with Friends and Family:   . Attends Religious Services:   . Active Member of Clubs or Organizations:   . Attends Archivist Meetings:   Marland Kitchen Marital Status:    Past Medical History:    Diagnosis Date  . Atherosclerotic heart disease of native coronary artery without angina pectoris 2019   Ventricular fibrillation - cardioverted x 3 in ambulance. PTCA LAD 100%  . Bradycardia   . Hyperlipidemia   . Hypertension   . Hypertensive heart disease without heart failure   . Morbid obesity ([place]) 08/05/2019  . Myocardial infarction involving left main coronary artery ([place])   . Obstructive sleep apnea   . Type 2 diabetes mellitus ([place])   . Unspecified hearing loss, bilateral   . Ventricular fibrillation ([facility])    Family History  Problem Relation Age of Onset  . Breast cancer Mother   . Melanoma Father   . Diabetes Sister   . Arrhythmia Brother   . Congestive Heart Failure Brother   . Heart attack Brother     Review of Systems  Constitutional: Negative for chills, fatigue and fever.  HENT: Negative for congestion, rhinorrhea and sore throat.   Respiratory: Negative for cough, chest tightness and shortness of breath.   Cardiovascular: Negative for chest pain.  Gastrointestinal: Negative for abdominal pain, constipation, diarrhea, nausea and vomiting.  Endocrine: Negative for polydipsia, polyphagia and polyuria.  Genitourinary: Negative for dysuria and urgency.  Musculoskeletal: Negative for arthralgias and myalgias.  Neurological: Positive for headaches. Negative for dizziness.  Psychiatric/Behavioral: Negative for dysphoric mood. The patient is not nervous/anxious.            Objective:  BP 138/88   Pulse (!) 58   Temp 97.8 F (36.6 C)   Ht 5\' 9"  (1.753 m)   Wt 275 lb (124.7 kg)   SpO2 99%   BMI 40.61 kg/m   BP/Weight 11/06/2019 11/03/3014 0/05/930  Systolic BP 355 - 732  Diastolic BP 88 - 64  Wt. (Lbs) 275 - -  BMI 40.61 39.28 39.28    Physical Exam Vitals reviewed.  Constitutional:      Appearance: Normal appearance.  Neck:     Vascular: No carotid bruit.  Cardiovascular:     Rate and Rhythm: Normal rate and regular rhythm.     Pulses: Normal  pulses.     Heart sounds: Normal heart sounds.  Pulmonary:     Effort: Pulmonary effort is normal.     Breath sounds: Normal breath sounds. No wheezing, rhonchi or rales.  Abdominal:     General: Bowel sounds are normal.     Palpations: Abdomen is soft.     Tenderness: There is no abdominal tenderness.  Neurological:     Mental Status: He is alert.  Psychiatric:        Mood and Affect: Mood normal.        Behavior: Behavior normal.    Diabetic Foot Exam - Simple   Simple Foot Form Diabetic Foot exam was performed with the following findings: Yes 11/06/2019 11:30 AM  Visual Inspection No deformities, no ulcerations, no other skin  breakdown bilaterally: Yes Sensation Testing Intact to touch and monofilament testing bilaterally: Yes Pulse Check Posterior Tibialis and Dorsalis pulse intact bilaterally: Yes Comments    Fall Risk  08/05/2019  Falls in the past year? 0  Number falls in past yr: 0  Injury with Fall? 0   Lab Results  Component Value Date   WBC 6.2 11/06/2019   HGB 14.2 11/06/2019   HCT 41.7 11/06/2019   PLT 151 11/06/2019   GLUCOSE 135 (H) 11/06/2019   CHOL 114 11/06/2019   TRIG 85 11/06/2019   HDL 42 11/06/2019   LDLCALC 55 11/06/2019   ALT 43 11/06/2019   AST 31 11/06/2019   NA 137 11/06/2019   K 4.0 11/06/2019   CL 100 11/06/2019   CREATININE 0.86 11/06/2019   BUN 8 11/06/2019   CO2 25 11/06/2019   TSH 4.660 (H) 11/06/2019   HGBA1C 5.9 (H) 11/06/2019   MICROALBUR 30 11/06/2019      Assessment & Plan:   1. Mixed hyperlipidemia Well controlled.  No changes to medicines.  Continue to work on eating a healthy diet and exercise.  Labs drawn today.  - Lipid panel  2. Hypertensive heart disease without heart failure Well controlled.  No changes to medicines.  Continue to work on eating a healthy diet and exercise.  Labs drawn today.  - CBC with Differential/Platelet - Comp. Metabolic Panel (12) Follow up with cardiology regularly.  3.  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 Recommend check feet daily. Recommend annual eye exams. Medicines: none Continue to work on eating a healthy diet and exercise.  Labs drawn today.   - Hemoglobin A1c  4. Secondary hypothyroidism The current medical regimen is effective;  continue present plan and medications. - TSH  5. Class 2 severe obesity due to excess calories with serious comorbidity and body mass index (BMI) of 39.0 to 39.9 in adult [facility]) Needs improvement.   Continue to work on eating a healthy diet and exercise.  Recommend 12 lb loss over the next 3 months.  6. Mild recurrent major depression.  The current medical regimen is effective;  continue present plan and medications. No changes to medications recommended.    Follow-up: Return in about 3 months (around 02/06/2020).  AVS was given to patient prior to departure.  Rochel Brome [facility] [phone]

## 2019-11-06 ENCOUNTER — Ambulatory Visit: Payer: Managed Care, Other (non HMO) | Admitting: Family Medicine

## 2019-11-06 ENCOUNTER — Other Ambulatory Visit: Payer: Self-pay

## 2019-11-06 ENCOUNTER — Encounter: Payer: Self-pay | Admitting: Family Medicine

## 2019-11-06 VITALS — BP 138/88 | HR 58 | Temp 97.8°F | Ht 69.0 in | Wt 275.0 lb

## 2019-11-06 DIAGNOSIS — E782 Mixed hyperlipidemia: Secondary | ICD-10-CM

## 2019-11-06 DIAGNOSIS — E1121 Type 2 diabetes mellitus with diabetic nephropathy: Secondary | ICD-10-CM | POA: Diagnosis not present

## 2019-11-06 DIAGNOSIS — I119 Hypertensive heart disease without heart failure: Secondary | ICD-10-CM | POA: Diagnosis not present

## 2019-11-06 DIAGNOSIS — E038 Other specified hypothyroidism: Secondary | ICD-10-CM | POA: Diagnosis not present

## 2019-11-06 LAB — POCT UA - MICROALBUMIN: Microalbumin Ur, POC: 30 mg/L

## 2019-11-06 MED ORDER — NITROGLYCERIN 0.4 MG SL SUBL: 0.4000 mg | SUBLINGUAL_TABLET | SUBLINGUAL | 0 refills | Status: AC | PRN

## 2019-11-06 NOTE — Patient Instructions (Signed)
Weight loss goal 12 lbs by next visit. Carry nitroglycerin tablets!!

## 2019-11-07 LAB — CBC WITH DIFFERENTIAL/PLATELET
Basophils Absolute: 0 10*3/uL (ref 0.0–0.2)
Basos: 0 %
EOS (ABSOLUTE): 0.1 10*3/uL (ref 0.0–0.4)
Eos: 2 %
Hematocrit: 41.7 % (ref 37.5–51.0)
Hemoglobin: 14.2 g/dL (ref 13.0–17.7)
Immature Grans (Abs): 0 10*3/uL (ref 0.0–0.1)
Immature Granulocytes: 1 %
Lymphocytes Absolute: 2.3 10*3/uL (ref 0.7–3.1)
Lymphs: 37 %
MCH: 31.1 pg (ref 26.6–33.0)
MCHC: 34.1 g/dL (ref 31.5–35.7)
MCV: 91 fL (ref 79–97)
Monocytes Absolute: 0.7 10*3/uL (ref 0.1–0.9)
Monocytes: 11 %
Neutrophils Absolute: 3.1 10*3/uL (ref 1.4–7.0)
Neutrophils: 49 %
Platelets: 151 10*3/uL (ref 150–450)
RBC: 4.57 x10E6/uL (ref 4.14–5.80)
RDW: 12.8 % (ref 11.6–15.4)
WBC: 6.2 10*3/uL (ref 3.4–10.8)

## 2019-11-07 LAB — COMPREHENSIVE METABOLIC PANEL
ALT: 43 IU/L (ref 0–44)
AST: 31 IU/L (ref 0–40)
Albumin/Globulin Ratio: 2.8 — ABNORMAL HIGH (ref 1.2–2.2)
Albumin: 4.5 g/dL (ref 3.8–4.9)
Alkaline Phosphatase: 89 IU/L (ref 48–121)
BUN/Creatinine Ratio: 9 (ref 9–20)
BUN: 8 mg/dL (ref 6–24)
Bilirubin Total: 0.6 mg/dL (ref 0.0–1.2)
CO2: 25 mmol/L (ref 20–29)
Calcium: 9 mg/dL (ref 8.7–10.2)
Chloride: 100 mmol/L (ref 96–106)
Creatinine, Ser: 0.86 mg/dL (ref 0.76–1.27)
GFR calc Af Amer: 110 mL/min/{1.73_m2} (ref >59)
GFR calc non Af Amer: 95 mL/min/{1.73_m2} (ref >59)
Globulin, Total: 1.6 g/dL (ref 1.5–4.5)
Glucose: 135 mg/dL — ABNORMAL HIGH (ref 65–99)
Potassium: 4 mmol/L (ref 3.5–5.2)
Sodium: 137 mmol/L (ref 134–144)
Total Protein: 6.1 g/dL (ref 6.0–8.5)

## 2019-11-07 LAB — TSH: TSH: 4.66 u[IU]/mL — ABNORMAL HIGH (ref 0.450–4.500)

## 2019-11-07 LAB — LIPID PANEL
Chol/HDL Ratio: 2.7 ratio (ref 0.0–5.0)
Cholesterol, Total: 114 mg/dL (ref 100–199)
HDL: 42 mg/dL (ref >39)
LDL Chol Calc (NIH): 55 mg/dL (ref 0–99)
Triglycerides: 85 mg/dL (ref 0–149)
VLDL Cholesterol Cal: 17 mg/dL (ref 5–40)

## 2019-11-07 LAB — CARDIOVASCULAR RISK ASSESSMENT

## 2019-11-07 LAB — HEMOGLOBIN A1C
Est. average glucose Bld gHb Est-mCnc: 123 mg/dL
Hgb A1c MFr Bld: 5.9 % — ABNORMAL HIGH (ref 4.8–5.6)

## 2019-11-10 ENCOUNTER — Encounter: Payer: Self-pay | Admitting: Family Medicine

## 2019-11-13 LAB — T4: T4, Total: 5.6 ug/dL (ref 4.5–12.0)

## 2019-11-13 LAB — SPECIMEN STATUS REPORT

## 2019-11-29 ENCOUNTER — Other Ambulatory Visit: Payer: Self-pay | Admitting: Family Medicine

## 2020-01-20 ENCOUNTER — Other Ambulatory Visit: Payer: Self-pay

## 2020-01-20 MED ORDER — TELMISARTAN-HCTZ 80-25 MG PO TABS: 1.0000 | ORAL_TABLET | Freq: Every day | ORAL | 0 refills | Status: DC

## 2020-02-11 ENCOUNTER — Other Ambulatory Visit: Payer: Self-pay

## 2020-02-11 ENCOUNTER — Encounter: Payer: Self-pay | Admitting: Family Medicine

## 2020-02-11 ENCOUNTER — Ambulatory Visit (INDEPENDENT_AMBULATORY_CARE_PROVIDER_SITE_OTHER): Payer: Managed Care, Other (non HMO) | Admitting: Family Medicine

## 2020-02-11 ENCOUNTER — Ambulatory Visit: Payer: Managed Care, Other (non HMO) | Admitting: Family Medicine

## 2020-02-11 VITALS — BP 130/80 | HR 60 | Temp 98.0°F | Resp 16 | Ht 69.0 in | Wt 281.0 lb

## 2020-02-11 DIAGNOSIS — E038 Other specified hypothyroidism: Secondary | ICD-10-CM | POA: Diagnosis not present

## 2020-02-11 DIAGNOSIS — F330 Major depressive disorder, recurrent, mild: Secondary | ICD-10-CM

## 2020-02-11 DIAGNOSIS — I119 Hypertensive heart disease without heart failure: Secondary | ICD-10-CM

## 2020-02-11 DIAGNOSIS — E1121 Type 2 diabetes mellitus with diabetic nephropathy: Secondary | ICD-10-CM

## 2020-02-11 DIAGNOSIS — E782 Mixed hyperlipidemia: Secondary | ICD-10-CM

## 2020-02-11 DIAGNOSIS — Z9989 Dependence on other enabling machines and devices: Secondary | ICD-10-CM

## 2020-02-11 DIAGNOSIS — I25118 Atherosclerotic heart disease of native coronary artery with other forms of angina pectoris: Secondary | ICD-10-CM

## 2020-02-11 DIAGNOSIS — G4733 Obstructive sleep apnea (adult) (pediatric): Secondary | ICD-10-CM

## 2020-02-11 NOTE — Patient Instructions (Addendum)
Cardiology referral-concern for decrease exercise tolerance Labwork today-will call results when available Continue checking glucose readings

## 2020-02-11 NOTE — Progress Notes (Signed)
Established Patient Office Visit  Subjective:  Patient ID: Gerald Jordan, male    DOB: 1960-09-13  Age: 59 y.o. MRN: [id number]  CC:  Chief Complaint  Patient presents with  . Hypothyroidism  . Hyperlipidemia  . Hypertension    HPI NAHSIR VENEZIA presents for glaucoma-sees specialist q 3 months-drops controlling pressures  Hyperlipidemia-lipitor daily  HTN-Coreg/Micardis-well controlled-no headaches, no visual changes  Depression-celexa/lamictal-well controlled  CAD-no recent use of nitro, stent in the past-decrease exercise tolerance noted over the past few weeks-ie moving grass has become more difficulty and more rest periods to get the job completed.pt states this is similar to when he needed a stent in the past  Past Medical History:  Diagnosis Date  . Atherosclerotic heart disease of native coronary artery without angina pectoris 2019   Ventricular fibrillation - cardioverted x 3 in ambulance. PTCA LAD 100%  . Bradycardia   . Hyperlipidemia   . Hypertension   . Hypertensive heart disease without heart failure   . Morbid obesity ([place]) 08/05/2019  . Myocardial infarction involving left main coronary artery ([place])   . Obstructive sleep apnea   . 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 Type 2 diabetes mellitus ([place])   . Unspecified hearing loss, bilateral   . Ventricular fibrillation [facility])     Past Surgical History:  Procedure Laterality Date  . arm surgery  05/2014  . CHOLECYSTECTOMY  02/2016  . HERNIA REPAIR     1999  . NASAL SINUS SURGERY  2000  . tendon repair 11/2014      Family History  Problem Relation Age of Onset  . Breast cancer Mother   . Melanoma Father   . Diabetes Sister   . Arrhythmia Brother   . Congestive Heart Failure Brother   . Heart attack Brother   . Atrial fibrillation Brother     Social History   Socioeconomic History  . Marital status: Married    Spouse name: Not on file  . Number of children: 1  . Years of education:  Not on file  . Highest education level: Not on file  Occupational History  . Occupation: Freight forwarder  Tobacco Use  . Smoking status: Never Smoker  . Smokeless tobacco: Never Used  Vaping Use  . Vaping Use: Never used  Substance and Sexual Activity  . Alcohol use: Not Currently  . Drug use: No  . Sexual activity: Yes    Partners: Female  Other Topics Concern  . Not on file  Social History Narrative  . Not on file   Social Determinants of Health   Financial Resource Strain:   . Difficulty of Paying Living Expenses: Not on file  Food Insecurity:   . Worried About Charity fundraiser in the Last Year: Not on file  . Ran Out of Food in the Last Year: Not on file  Transportation Needs:   . Lack of Transportation (Medical): Not on file  . Lack of Transportation (Non-Medical): Not on file  Physical Activity:   . Days of Exercise per Week: Not on file  . Minutes of Exercise per Session: Not on file  Stress:   . Feeling of Stress : Not on file  Social Connections:   . Frequency of Communication with Friends and Family: Not on file  . Frequency of Social Gatherings with Friends and Family: Not on file  . Attends Religious Services: Not on file  . Active Member of Clubs or Organizations: Not on file  . Attends  Club or Organization Meetings: Not on file  . Marital Status: Not on file  Intimate Partner Violence:   . Fear of Current or Ex-Partner: Not on file  . Emotionally Abused: Not on file  . Physically Abused: Not on file  . Sexually Abused: Not on file    Outpatient Medications Prior to Visit  Medication Sig Dispense Refill  . aspirin 81 MG tablet Take 81 mg by mouth daily.    Marland Kitchen atorvastatin (LIPITOR) 80 MG tablet TAKE 1 TABLET DAILY 90 tablet 3  . carvedilol (COREG) 6.25 MG tablet TAKE 1 TABLET BY MOUTH TWICE A DAY 60 tablet 3  . citalopram (CELEXA) 20 MG tablet Take 1 tablet (20 mg total) by mouth daily. 90 tablet 1  . lamoTRIgine (LAMICTAL) 150 MG tablet Take 150 mg by  mouth daily. Take two tablets once a day.    . latanoprost (XALATAN) 0.005 % ophthalmic solution Place 1 drop into both eyes nightly.    . levothyroxine (SYNTHROID) 112 MCG tablet Take 1 tablet (112 mcg total) by mouth daily. 90 tablet 3  . nitroGLYCERIN (NITROSTAT) 0.4 MG SL tablet Place 1 tablet (0.4 mg total) under the tongue every 5 (five) minutes as needed for chest pain. 25 tablet 0  . telmisartan-hydrochlorothiazide (MICARDIS HCT) 80-25 MG tablet Take 1 tablet by mouth daily. 90 tablet 0  . acetaminophen (TYLENOL) 500 MG tablet Take 1 tablet (500 mg total) by mouth every 6 (six) hours as needed. 30 tablet 0   No facility-administered medications prior to visit.    Allergies  Allergen Reactions  . Penicillins Nausea And Vomiting    Ulcers - tolerated Augmentin 09/2012 Mouth Ulcers - tolerated Augmentin 09/2012 Ulcers - tolerated Augmentin 09/2012   . Lisinopril Other (See Comments)    Coughing Coughing     ROS Review of Systems  Respiratory: Negative for cough, shortness of breath and wheezing.   Cardiovascular: Positive for chest pain. Negative for palpitations and leg swelling.  Gastrointestinal: Negative.   Neurological: Negative for dizziness, tremors, seizures, weakness and headaches.      Objective:    Physical Exam Constitutional:      Appearance: Normal appearance.  HENT:     Head: Normocephalic and atraumatic.  Cardiovascular:     Rate and Rhythm: Normal rate and regular rhythm.     Pulses: Normal pulses.     Heart sounds: Normal heart sounds.  Pulmonary:     Effort: Pulmonary effort is normal.     Breath sounds: Normal breath sounds.  Neurological:     Mental Status: He is alert and oriented to person, place, and time.  Psychiatric:        Mood and Affect: Mood normal.        Behavior: Behavior normal.     BP 130/80 (BP Location: Right Arm, Patient Position: Sitting)   Pulse 60   Temp 98 F (36.7 C) (Temporal)   Resp 16   Ht 5\' 9"  (1.753 m)    Wt 281 lb (127.5 kg)   SpO2 96%   BMI 41.50 kg/m  Wt Readings from Last 3 Encounters:  02/11/20 281 lb (127.5 kg)  11/06/19 275 lb (124.7 kg)  08/05/19 266 lb (120.7 kg)     Health Maintenance Due  Topic Date Due  . Hepatitis C Screening  Never done  . OPHTHALMOLOGY EXAM  Never done  . COVID-19 Vaccine (1) Never done  . HIV Screening  Never done  . COLONOSCOPY  Never done  .  INFLUENZA VACCINE  12/29/2019    Lab Results  Component Value Date   TSH 4.660 (H) 11/06/2019   Lab Results  Component Value Date   WBC 6.2 11/06/2019   HGB 14.2 11/06/2019   HCT 41.7 11/06/2019   MCV 91 11/06/2019   PLT 151 11/06/2019   Lab Results  Component Value Date   NA 137 11/06/2019   K 4.0 11/06/2019   CO2 25 11/06/2019   GLUCOSE 135 (H) 11/06/2019   BUN 8 11/06/2019   CREATININE 0.86 11/06/2019   BILITOT 0.6 11/06/2019   ALKPHOS 89 11/06/2019   AST 31 11/06/2019   ALT 43 11/06/2019   PROT 6.1 11/06/2019   ALBUMIN 4.5 11/06/2019   CALCIUM 9.0 11/06/2019   Lab Results  Component Value Date   CHOL 114 11/06/2019   Lab Results  Component Value Date   HDL 42 11/06/2019   Lab Results  Component Value Date   LDLCALC 55 11/06/2019   Lab Results  Component Value Date   TRIG 85 11/06/2019   Lab Results  Component Value Date   CHOLHDL 2.7 11/06/2019   Lab Results  Component Value Date   HGBA1C 5.9 (H) 11/06/2019      Assessment & Plan:  1. Mixed hyperlipidemia - Lipid panel Atorvastatin daily-LDL 57. TC 114 2. Diabetic glomerulopathy (HCC) 130-160-fasting glucose - Comprehensive metabolic panel - CBC with Differential/Platelet - Hemoglobin A1c5.9%  3. Secondary hypothyroidism Synthroid daily-no rapid heart rate - TSH-4.6 -6/21-3.6  4. Hypertensive heart disease without heart failure - Ambulatory referral to Cardiology  5. Mild recurrent major depression (HCC) Celexa/Lamictal stablized  6. Coronary artery disease of native artery of native heart with  stable angina pectoris ([place]) Cardio referral -Decreased exercise tolerance  7. OSA on CPAP PT WEARS EVERY NIGHT Follow-up: cardiology   Mirissa Lopresti Hannah Beat, MD

## 2020-02-12 LAB — TSH: TSH: 3.61 u[IU]/mL (ref 0.450–4.500)

## 2020-02-12 LAB — CBC WITH DIFFERENTIAL/PLATELET
Basophils Absolute: 0 10*3/uL (ref 0.0–0.2)
Basos: 0 %
EOS (ABSOLUTE): 0.1 10*3/uL (ref 0.0–0.4)
Eos: 1 %
Hematocrit: 43.2 % (ref 37.5–51.0)
Hemoglobin: 14.5 g/dL (ref 13.0–17.7)
Immature Grans (Abs): 0 10*3/uL (ref 0.0–0.1)
Immature Granulocytes: 1 %
Lymphocytes Absolute: 2.3 10*3/uL (ref 0.7–3.1)
Lymphs: 36 %
MCH: 30.6 pg (ref 26.6–33.0)
MCHC: 33.6 g/dL (ref 31.5–35.7)
MCV: 91 fL (ref 79–97)
Monocytes Absolute: 0.7 10*3/uL (ref 0.1–0.9)
Monocytes: 11 %
Neutrophils Absolute: 3.2 10*3/uL (ref 1.4–7.0)
Neutrophils: 51 %
Platelets: 162 10*3/uL (ref 150–450)
RBC: 4.74 x10E6/uL (ref 4.14–5.80)
RDW: 13 % (ref 11.6–15.4)
WBC: 6.3 10*3/uL (ref 3.4–10.8)

## 2020-02-12 LAB — COMPREHENSIVE METABOLIC PANEL
ALT: 57 IU/L — ABNORMAL HIGH (ref 0–44)
AST: 34 IU/L (ref 0–40)
Albumin/Globulin Ratio: 2.5 — ABNORMAL HIGH (ref 1.2–2.2)
Albumin: 4.5 g/dL (ref 3.8–4.9)
Alkaline Phosphatase: 84 IU/L (ref 44–121)
BUN/Creatinine Ratio: 13 (ref 9–20)
BUN: 11 mg/dL (ref 6–24)
Bilirubin Total: 0.7 mg/dL (ref 0.0–1.2)
CO2: 23 mmol/L (ref 20–29)
Calcium: 8.9 mg/dL (ref 8.7–10.2)
Chloride: 101 mmol/L (ref 96–106)
Creatinine, Ser: 0.85 mg/dL (ref 0.76–1.27)
GFR calc Af Amer: 110 mL/min/{1.73_m2} (ref >59)
GFR calc non Af Amer: 95 mL/min/{1.73_m2} (ref >59)
Globulin, Total: 1.8 g/dL (ref 1.5–4.5)
Glucose: 142 mg/dL — ABNORMAL HIGH (ref 65–99)
Potassium: 4.3 mmol/L (ref 3.5–5.2)
Sodium: 138 mmol/L (ref 134–144)
Total Protein: 6.3 g/dL (ref 6.0–8.5)

## 2020-02-12 LAB — LIPID PANEL
Chol/HDL Ratio: 3.2 ratio (ref 0.0–5.0)
Cholesterol, Total: 114 mg/dL (ref 100–199)
HDL: 36 mg/dL — ABNORMAL LOW (ref >39)
LDL Chol Calc (NIH): 58 mg/dL (ref 0–99)
Triglycerides: 105 mg/dL (ref 0–149)
VLDL Cholesterol Cal: 20 mg/dL (ref 5–40)

## 2020-02-12 LAB — HEMOGLOBIN A1C
Est. average glucose Bld gHb Est-mCnc: 143 mg/dL
Hgb A1c MFr Bld: 6.6 % — ABNORMAL HIGH (ref 4.8–5.6)

## 2020-02-12 LAB — CARDIOVASCULAR RISK ASSESSMENT

## 2020-02-17 ENCOUNTER — Other Ambulatory Visit: Payer: Self-pay

## 2020-02-17 ENCOUNTER — Telehealth: Payer: Self-pay

## 2020-02-17 DIAGNOSIS — E1121 Type 2 diabetes mellitus with diabetic nephropathy: Secondary | ICD-10-CM

## 2020-02-17 MED ORDER — GLUCOSE BLOOD VI STRP: 1.0000 | ORAL_STRIP | Freq: Every day | 3 refills | Status: DC

## 2020-02-17 NOTE — Telephone Encounter (Signed)
-----   Message from Rochel Brome, MD sent at 02/17/2020  4:18 PM EDT ----- Regarding: Labs Cbc nl  Liver - one level slightly elevated.  Kidney function normal.  Tsh normal.  Lipids well controlled.  DM worsened. A1c 6.6. Recommend work on diet and exercise.  No changes to medicines.  Kc

## 2020-02-17 NOTE — Telephone Encounter (Signed)
Patient notified of results.

## 2020-04-29 ENCOUNTER — Other Ambulatory Visit: Payer: Self-pay

## 2020-04-29 MED ORDER — TELMISARTAN-HCTZ 80-25 MG PO TABS: 1.0000 | ORAL_TABLET | Freq: Every day | ORAL | 1 refills | Status: DC

## 2020-04-29 MED ORDER — LAMOTRIGINE 150 MG PO TABS: 150.0000 mg | ORAL_TABLET | Freq: Every day | ORAL | 0 refills | Status: DC

## 2020-04-29 MED ORDER — CITALOPRAM HYDROBROMIDE 20 MG PO TABS: 20.0000 mg | ORAL_TABLET | Freq: Every day | ORAL | 1 refills | Status: DC

## 2020-05-04 ENCOUNTER — Other Ambulatory Visit: Payer: Self-pay

## 2020-05-04 MED ORDER — LAMOTRIGINE 150 MG PO TABS
150.0000 mg | ORAL_TABLET | Freq: Every day | ORAL | 1 refills | Status: DC
Start: 2020-05-04 — End: 2020-08-09

## 2020-05-30 HISTORY — PX: COLONOSCOPY: SHX174

## 2020-07-26 NOTE — Progress Notes (Signed)
Subjective:  Patient ID: Gerald Jordan, male    DOB: Jun 26, 1960  Age: 60 y.o. MRN: [id number]  Chief Complaint  Patient presents with  . Hyperlipidemia  . Diabetes  . Hypothyroidism    HPI Well Adult Physical: Patient here for a comprehensive physical exam.The patient reports problems - OSA: Fatigue Do you take any herbs or supplements that were not prescribed by a doctor? yes Are you taking calcium supplements? no Are you taking aspirin daily? yes  Encounter for general adult medical examination without abnormal findings  Physical ("At Risk" items are starred): Patient's last physical exam was 1 year ago .  Smoking: Life-long non-smoker ;  Eat healthy. Lost 21 lbs in the last 3 weeks.  Physical Activity: Exercises at least 3 times per week ;  Alcohol/Drug Use: Is a non-drinker ; No illicit drug use ;  Patient is not afflicted from Stress Incontinence and Urge Incontinence  Safety: reviewed. Patient wears a seat belt, has smoke detectors, has carbon monoxide detectors, practices appropriate gun safety, and stays covered so no sun exposure. Dental Care: biannual cleanings, brushes and flosses daily. Ophthalmology/Optometry: 3-4 times per year for glaucoma. Hearing loss: wife says it is terrible. We have tested previously and was normal. Vision impairments: distance glasses Gets a long well with his wife and is not threatened.    Julian Office Visit from 07/27/2020 in [place]  PHQ-2 Total Score 0       Social Hx   Social History   Socioeconomic History  . Marital status: Married    Spouse name: Not on file  . Number of children: 1  . Years of education: Not on file  . Highest education level: Not on file  Occupational History  . Occupation: Freight forwarder  Tobacco Use  . Smoking status: Never Smoker  . Smokeless tobacco: Never Used  Vaping Use  . Vaping Use: Never used  Substance and Sexual Activity  . Alcohol use: Not Currently  . Drug use: No  .  Sexual activity: Yes    Partners: Female  Other Topics Concern  . Not on file  Social History Narrative  . Not on file   Social Determinants of Health   Financial Resource Strain: Not on file  Food Insecurity: Not on file  Transportation Needs: Not on file  Physical Activity: Not on file  Stress: Not on file  Social Connections: Not on file   Past Medical History:  Diagnosis Date  . Atherosclerotic heart disease of native coronary artery without angina pectoris 2019   Ventricular fibrillation - cardioverted x 3 in ambulance. PTCA LAD 100%  . Bradycardia   . Hyperlipidemia   . Hypertension   . Hypertensive heart disease without heart failure   . Morbid obesity ([place]) 08/05/2019  . Myocardial infarction involving left main coronary artery ([place])   . Obstructive sleep apnea   . 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 Type 2 diabetes mellitus ([place])   . Unspecified hearing loss, bilateral   . Ventricular fibrillation [facility])    Past Surgical History:  Procedure Laterality Date  . arm surgery  05/2014  . CHOLECYSTECTOMY  02/2016  . HERNIA REPAIR     1999  . NASAL SINUS SURGERY  2000  . tendon repair 11/2014      Family History  Problem Relation Age of Onset  . Breast cancer Mother   . Melanoma Father   . Diabetes Sister   . Arrhythmia Brother   . Congestive  Heart Failure Brother   . Heart attack Brother   . Atrial fibrillation Brother     Review of Systems  Constitutional: Positive for fatigue. Negative for chills, fever and unexpected weight change.  HENT: Negative for congestion, ear pain, sinus pain and sore throat.   Respiratory: Positive for cough.   Cardiovascular: Negative for chest pain and palpitations.  Gastrointestinal: Negative for abdominal pain, blood in stool, constipation, diarrhea, nausea and vomiting.  Endocrine: Negative for polydipsia.  Genitourinary: Negative for dysuria.  Musculoskeletal: Negative for arthralgias and back pain.  Skin: Negative for  rash.  Neurological: Positive for light-headedness and headaches.  Psychiatric/Behavioral: Negative for dysphoric mood. The patient is not nervous/anxious.      Objective:  BP 110/64   Pulse 78   Temp 98.7 F (37.1 C)   Resp 16   Ht 5\' 9"  (1.753 m)   Wt 259 lb 3.2 oz (117.6 kg)   BMI 38.28 kg/m   BP/Weight 07/27/2020 5/70/1779 08/05/298  Systolic BP 923 300 762  Diastolic BP 64 80 88  Wt. (Lbs) 259.2 281 275  BMI 38.28 41.5 40.61    Physical Exam Constitutional:      Appearance: Normal appearance.  Cardiovascular:     Rate and Rhythm: Normal rate and regular rhythm.     Pulses: Normal pulses.     Heart sounds: Normal heart sounds.  Pulmonary:     Effort: Pulmonary effort is normal.     Breath sounds: Normal breath sounds.  Abdominal:     General: Bowel sounds are normal.     Palpations: Abdomen is soft. There is no mass.     Tenderness: There is no abdominal tenderness.  Neurological:     Mental Status: He is alert.  Psychiatric:        Mood and Affect: Mood normal.        Behavior: Behavior normal.        Thought Content: Thought content normal.     Lab Results  Component Value Date   WBC 6.1 07/29/2020   HGB 14.7 07/29/2020   HCT 43.0 07/29/2020   PLT 160 07/29/2020   GLUCOSE 110 (H) 07/29/2020   CHOL 91 (L) 07/29/2020   TRIG 90 07/29/2020   HDL 33 (L) 07/29/2020   LDLCALC 40 07/29/2020   ALT 31 07/29/2020   AST 29 07/29/2020   NA 140 07/29/2020   K 4.3 07/29/2020   CL 101 07/29/2020   CREATININE 0.89 07/29/2020   BUN 13 07/29/2020   CO2 25 07/29/2020   TSH 5.100 (H) 07/29/2020   HGBA1C 5.5 07/29/2020   MICROALBUR 30 07/29/2020      Assessment & Plan:  1. Routine medical exam  Recommend continue to work on eating healthy diet and exercise.  Body mass index is 38.28 kg/m.   This is a list of the screening recommended for you and due dates:  Health Maintenance  Topic Date Due  .  Hepatitis C: One time screening is recommended by Center  for Disease Control  (CDC) for  adults born from [birth year] through 1965.   Never done  . COVID-19 Vaccine (1) Never done  . Eye exam for diabetics  Never done  . HIV Screening  Never done  . Colon Cancer Screening  Never done  . Flu Shot  12/29/2019  . Complete foot exam   11/05/2020  . Hemoglobin A1C  01/29/2021  . Tetanus Vaccine  01/21/2026  . Pneumococcal vaccine  Completed  . HPV Vaccine  Aged Out     AN INDIVIDUALIZED CARE PLAN: was established or reinforced today.   SELF MANAGEMENT: The patient and I together assessed ways to personally work towards obtaining the recommended goals  Support needs The patient and/or family needs were assessed and services were offered if appropriate.  Follow-up: Return in about 3 months (around 10/24/2020) for fasting.  An After Visit Summary was printed and given to the patient.  Rochel Brome, MD [facility] [phone]

## 2020-07-27 ENCOUNTER — Other Ambulatory Visit: Payer: Self-pay

## 2020-07-27 ENCOUNTER — Ambulatory Visit (INDEPENDENT_AMBULATORY_CARE_PROVIDER_SITE_OTHER): Payer: Managed Care, Other (non HMO) | Admitting: Family Medicine

## 2020-07-27 VITALS — BP 110/64 | HR 78 | Temp 98.7°F | Resp 16 | Ht 69.0 in | Wt 259.2 lb

## 2020-07-27 DIAGNOSIS — Z0000 Encounter for general adult medical examination without abnormal findings: Secondary | ICD-10-CM

## 2020-07-27 NOTE — Patient Instructions (Signed)

## 2020-07-28 ENCOUNTER — Other Ambulatory Visit: Payer: Managed Care, Other (non HMO)

## 2020-07-29 ENCOUNTER — Other Ambulatory Visit (INDEPENDENT_AMBULATORY_CARE_PROVIDER_SITE_OTHER): Payer: Managed Care, Other (non HMO)

## 2020-07-29 ENCOUNTER — Other Ambulatory Visit: Payer: Self-pay | Admitting: Family Medicine

## 2020-07-29 ENCOUNTER — Telehealth: Payer: Self-pay

## 2020-07-29 ENCOUNTER — Other Ambulatory Visit: Payer: Self-pay

## 2020-07-29 DIAGNOSIS — E1121 Type 2 diabetes mellitus with diabetic nephropathy: Secondary | ICD-10-CM

## 2020-07-29 DIAGNOSIS — I119 Hypertensive heart disease without heart failure: Secondary | ICD-10-CM

## 2020-07-29 DIAGNOSIS — E782 Mixed hyperlipidemia: Secondary | ICD-10-CM

## 2020-07-29 DIAGNOSIS — E038 Other specified hypothyroidism: Secondary | ICD-10-CM

## 2020-07-29 LAB — POCT UA - MICROALBUMIN: Microalbumin Ur, POC: 30 mg/L

## 2020-07-29 MED ORDER — BENZONATATE 200 MG PO CAPS: 200.0000 mg | ORAL_CAPSULE | Freq: Three times a day (TID) | ORAL | 0 refills | Status: DC | PRN

## 2020-07-29 NOTE — Telephone Encounter (Signed)
Sent cough medicine - tessalon perles. Kc

## 2020-07-29 NOTE — Telephone Encounter (Signed)
Patient left a message requesting a rx be sent in for his cough.

## 2020-07-30 ENCOUNTER — Other Ambulatory Visit: Payer: Self-pay | Admitting: Family Medicine

## 2020-07-30 LAB — CBC WITH DIFFERENTIAL/PLATELET
Basophils Absolute: 0 10*3/uL (ref 0.0–0.2)
Basos: 0 %
EOS (ABSOLUTE): 0.1 10*3/uL (ref 0.0–0.4)
Eos: 1 %
Hematocrit: 43 % (ref 37.5–51.0)
Hemoglobin: 14.7 g/dL (ref 13.0–17.7)
Immature Grans (Abs): 0 10*3/uL (ref 0.0–0.1)
Immature Granulocytes: 0 %
Lymphocytes Absolute: 2.1 10*3/uL (ref 0.7–3.1)
Lymphs: 35 %
MCH: 30.9 pg (ref 26.6–33.0)
MCHC: 34.2 g/dL (ref 31.5–35.7)
MCV: 90 fL (ref 79–97)
Monocytes Absolute: 0.7 10*3/uL (ref 0.1–0.9)
Monocytes: 12 %
Neutrophils Absolute: 3.2 10*3/uL (ref 1.4–7.0)
Neutrophils: 52 %
Platelets: 160 10*3/uL (ref 150–450)
RBC: 4.76 x10E6/uL (ref 4.14–5.80)
RDW: 12.3 % (ref 11.6–15.4)
WBC: 6.1 10*3/uL (ref 3.4–10.8)

## 2020-07-30 LAB — HEMOGLOBIN A1C
Est. average glucose Bld gHb Est-mCnc: 111 mg/dL
Hgb A1c MFr Bld: 5.5 % (ref 4.8–5.6)

## 2020-07-30 LAB — COMPREHENSIVE METABOLIC PANEL
ALT: 31 IU/L (ref 0–44)
AST: 29 IU/L (ref 0–40)
Albumin/Globulin Ratio: 2.4 — ABNORMAL HIGH (ref 1.2–2.2)
Albumin: 4.6 g/dL (ref 3.8–4.9)
Alkaline Phosphatase: 88 IU/L (ref 44–121)
BUN/Creatinine Ratio: 15 (ref 9–20)
BUN: 13 mg/dL (ref 6–24)
Bilirubin Total: 0.7 mg/dL (ref 0.0–1.2)
CO2: 25 mmol/L (ref 20–29)
Calcium: 9.2 mg/dL (ref 8.7–10.2)
Chloride: 101 mmol/L (ref 96–106)
Creatinine, Ser: 0.89 mg/dL (ref 0.76–1.27)
Globulin, Total: 1.9 g/dL (ref 1.5–4.5)
Glucose: 110 mg/dL — ABNORMAL HIGH (ref 65–99)
Potassium: 4.3 mmol/L (ref 3.5–5.2)
Sodium: 140 mmol/L (ref 134–144)
Total Protein: 6.5 g/dL (ref 6.0–8.5)
eGFR: 99 mL/min/{1.73_m2} (ref >59)

## 2020-07-30 LAB — LIPID PANEL
Chol/HDL Ratio: 2.8 ratio (ref 0.0–5.0)
Cholesterol, Total: 91 mg/dL — ABNORMAL LOW (ref 100–199)
HDL: 33 mg/dL — ABNORMAL LOW (ref >39)
LDL Chol Calc (NIH): 40 mg/dL (ref 0–99)
Triglycerides: 90 mg/dL (ref 0–149)
VLDL Cholesterol Cal: 18 mg/dL (ref 5–40)

## 2020-07-30 LAB — TSH: TSH: 5.1 u[IU]/mL — ABNORMAL HIGH (ref 0.450–4.500)

## 2020-07-30 LAB — CARDIOVASCULAR RISK ASSESSMENT

## 2020-07-30 MED ORDER — HYDROCODONE-HOMATROPINE 5-1.5 MG/5ML PO SYRP: 5.0000 mL | ORAL_SOLUTION | Freq: Three times a day (TID) | ORAL | 0 refills | Status: DC | PRN

## 2020-07-30 NOTE — Telephone Encounter (Signed)
Recommend delsym during the day.  I will send hydromet for cough at night.  If continues or worsens, pt should make another appointment.  kc

## 2020-07-30 NOTE — Telephone Encounter (Signed)
Patient notified that RX was sent to the pharmacy for cough.  He was encouraged to use delsym during the day.  Follow-up in the office if symptoms do not improve.

## 2020-07-30 NOTE — Telephone Encounter (Signed)
Attempted to call pt. No answer, left detailed message that medication was sent on identifying VM box.   Royce Macadamia, [place] 07/30/20 9:07 AM

## 2020-07-30 NOTE — Telephone Encounter (Signed)
Pt called back and states tessalon does not work for him and is requesting something different.   [facility] [address] is preferred pharmacy.  Please advise.  Gerald Jordan, [place] 07/30/20 9:40 AM

## 2020-08-03 LAB — T4, FREE: Free T4: 0.94 ng/dL (ref 0.82–1.77)

## 2020-08-03 LAB — SPECIMEN STATUS REPORT

## 2020-08-03 LAB — PSA: Prostate Specific Ag, Serum: 0.5 ng/mL (ref 0.0–4.0)

## 2020-08-09 ENCOUNTER — Encounter: Payer: Self-pay | Admitting: Family Medicine

## 2020-08-09 ENCOUNTER — Other Ambulatory Visit: Payer: Self-pay | Admitting: Family Medicine

## 2020-08-09 MED ORDER — LAMOTRIGINE 150 MG PO TABS: 150.0000 mg | ORAL_TABLET | Freq: Every day | ORAL | 1 refills | Status: DC

## 2020-08-09 MED ORDER — LEVOTHYROXINE SODIUM 112 MCG PO TABS: 112.0000 ug | ORAL_TABLET | Freq: Every day | ORAL | 1 refills | Status: DC

## 2020-08-09 MED ORDER — CARVEDILOL 6.25 MG PO TABS: 6.2500 mg | ORAL_TABLET | Freq: Two times a day (BID) | ORAL | 1 refills | Status: DC

## 2020-08-09 MED ORDER — ATORVASTATIN CALCIUM 80 MG PO TABS: 80.0000 mg | ORAL_TABLET | Freq: Every day | ORAL | 1 refills | Status: DC

## 2020-08-09 MED ORDER — CITALOPRAM HYDROBROMIDE 20 MG PO TABS: 20.0000 mg | ORAL_TABLET | Freq: Every day | ORAL | 1 refills | Status: DC

## 2020-08-09 MED ORDER — TELMISARTAN-HCTZ 80-25 MG PO TABS: 1.0000 | ORAL_TABLET | Freq: Every day | ORAL | 1 refills | Status: DC

## 2020-08-11 ENCOUNTER — Other Ambulatory Visit: Payer: Self-pay

## 2020-08-11 MED ORDER — LAMOTRIGINE 150 MG PO TABS: 150.0000 mg | ORAL_TABLET | Freq: Two times a day (BID) | ORAL | 1 refills | Status: DC

## 2020-08-13 ENCOUNTER — Other Ambulatory Visit: Payer: Self-pay

## 2020-08-13 ENCOUNTER — Other Ambulatory Visit: Payer: Self-pay | Admitting: Family Medicine

## 2020-11-19 ENCOUNTER — Telehealth (INDEPENDENT_AMBULATORY_CARE_PROVIDER_SITE_OTHER): Payer: Managed Care, Other (non HMO) | Admitting: Nurse Practitioner

## 2020-11-19 ENCOUNTER — Encounter: Payer: Self-pay | Admitting: Nurse Practitioner

## 2020-11-19 VITALS — BP 110/60 | Ht 69.0 in | Wt 259.0 lb

## 2020-11-19 DIAGNOSIS — U071 COVID-19: Secondary | ICD-10-CM

## 2020-11-19 MED ORDER — NIRMATRELVIR/RITONAVIR (PAXLOVID)TABLET: 3.0000 | ORAL_TABLET | Freq: Two times a day (BID) | ORAL | 0 refills | Status: AC

## 2020-11-19 NOTE — Progress Notes (Signed)
Virtual Visit via Video Note   This visit type was conducted due to national recommendations for restrictions regarding the COVID-19 Pandemic (e.g. social distancing) in an effort to limit this patient's exposure and mitigate transmission in our community.  Due to his co-morbid illnesses, this patient is at least at moderate risk for complications without adequate follow up.  This format is felt to be most appropriate for this patient at this time.  All issues noted in this document were discussed and addressed.  A limited physical exam was performed with this format.  A verbal consent was obtained for the virtual visit.   Date:  11/19/2020   ID:  Gerald Jordan, DOB 09/28/1960, MRN [id number]  Patient Location: Home Provider Location: Home Office  PCP:  Rochel Brome, MD   Evaluation Performed:  Established patient acute telemedicine visit  Chief Complaint:  Fever, sinus congestion  History of Present Illness:    Gerald Jordan is a 60 y.o. male with fever and sinus congestion. Onset of symptoms was two days ago. Treatment has included Ibuprofen. Pt had positive home COVID-19 test today.   The patient does have symptoms concerning for COVID-19 infection (fever, chills, cough, or new shortness of breath).    Past Medical History:  Diagnosis Date   Atherosclerotic heart disease of native coronary artery without angina pectoris 2019   Ventricular fibrillation - cardioverted x 3 in ambulance. 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   Type 2 diabetes mellitus (HCC)    Unspecified hearing loss, bilateral    Ventricular fibrillation [facility])     Past Surgical History:  Procedure Laterality Date   arm surgery  05/2014   CHOLECYSTECTOMY   02/2016   HERNIA REPAIR     1999   NASAL SINUS SURGERY  2000   tendon repair 11/2014      Family History  Problem Relation Age of Onset   Breast cancer Mother    Melanoma Father    Diabetes Sister    Arrhythmia Brother    Congestive Heart Failure Brother    Heart attack Brother    Atrial fibrillation Brother     Social History   Socioeconomic History   Marital status: Married    Spouse name: Not on file   Number of children: 1   Years of education: Not on file   Highest education level: Not on file  Occupational History   Occupation: Freight forwarder  Tobacco Use   Smoking status: Never   Smokeless tobacco: Never  Vaping Use   Vaping Use: Never used  Substance and Sexual Activity   Alcohol use: Not Currently   Drug use: No   Sexual activity: Yes    Partners: Female  Other Topics Concern   Not on file  Social History Narrative   Not on file   Social Determinants of Health   Financial Resource Strain: Not on file  Food Insecurity: Not on file  Transportation Needs: Not on file  Physical Activity: Not on file  Stress: Not on file  Social Connections: Not on file  Intimate Partner Violence: Not on file    Outpatient Medications Prior to Visit  Medication Sig Dispense Refill   aspirin 81 MG tablet Take  81 mg by mouth daily.     atorvastatin (LIPITOR) 80 MG tablet Take 1 tablet (80 mg total) by mouth daily. 90 tablet 1   carvedilol (COREG) 6.25 MG tablet Take 1 tablet (6.25 mg total) by mouth 2 (two) times daily. 180 tablet 1   citalopram (CELEXA) 20 MG tablet Take 1 tablet (20 mg total) by mouth daily. 90 tablet 1   glucose blood test strip 1 each by Other route daily. Use as instructed 100 each 3   lamoTRIgine (LAMICTAL) 150 MG tablet Take 1 tablet (150 mg total) by mouth 2 (two) times daily. Take two tablets once a day. 180 tablet 1   latanoprost (XALATAN) 0.005 % ophthalmic solution Place 1 drop into both eyes nightly.     levothyroxine (SYNTHROID) 112 MCG tablet  Take 1 tablet (112 mcg total) by mouth daily. 90 tablet 1   nitroGLYCERIN (NITROSTAT) 0.4 MG SL tablet Place 1 tablet (0.4 mg total) under the tongue every 5 (five) minutes as needed for chest pain. 25 tablet 0   sulfamethoxazole-trimethoprim (BACTRIM DS) 800-160 MG tablet Take 1 tablet by mouth 2 (two) times daily.     tamsulosin (FLOMAX) 0.4 MG CAPS capsule Take 0.4 mg by mouth daily.     telmisartan-hydrochlorothiazide (MICARDIS HCT) 80-25 MG tablet Take 1 tablet by mouth daily. 90 tablet 1   benzonatate (TESSALON) 200 MG capsule Take 1 capsule (200 mg total) by mouth 3 (three) times daily as needed for cough. 30 capsule 0   HYDROcodone-homatropine (HYCODAN) 5-1.5 MG/5ML syrup Take 5 mLs by mouth every 8 (eight) hours as needed for cough. 120 mL 0   No facility-administered medications prior to visit.    Allergies:   Penicillins and Lisinopril   Social History   Tobacco Use   Smoking status: Never   Smokeless tobacco: Never  Vaping Use   Vaping Use: Never used  Substance Use Topics   Alcohol use: Not Currently   Drug use: No     Review of Systems  Constitutional:  Positive for fever and malaise/fatigue.  HENT:  Positive for congestion. Negative for sore throat.   Eyes: Negative.   Respiratory:  Negative for cough, shortness of breath and wheezing.   Cardiovascular:  Negative for chest pain, palpitations and leg swelling.  Gastrointestinal: Negative.   Genitourinary: Negative.   Musculoskeletal:  Positive for myalgias (mild body aches).  Skin:  Negative for rash.  Neurological: Negative.   Endo/Heme/Allergies: Negative.   Psychiatric/Behavioral: Negative.      Labs/Other Tests and Data Reviewed:    Recent Labs: 07/29/2020: ALT 31; BUN 13; Creatinine, Ser 0.89; Hemoglobin 14.7; Platelets 160; Potassium 4.3; Sodium 140; TSH 5.100   Recent Lipid Panel Lab Results  Component Value Date/Time   CHOL 91 (L) 07/29/2020 10:29 AM   TRIG 90 07/29/2020 10:29 AM   HDL 33 (L)  07/29/2020 10:29 AM   CHOLHDL 2.8 07/29/2020 10:29 AM   LDLCALC 40 07/29/2020 10:29 AM    Wt Readings from Last 3 Encounters:  11/19/20 259 lb (117.5 kg)  07/27/20 259 lb 3.2 oz (117.6 kg)  02/11/20 281 lb (127.5 kg)     Objective:    Vital Signs:  BP 110/60   Ht 5\' 9"  (1.753 m)   Wt 259 lb (117.5 kg)   BMI 38.25 kg/m    Physical Exam No physical exam performed due to telemedicine visit  ASSESSMENT & PLAN:     1. COVID-19 - nirmatrelvir/ritonavir EUA (PAXLOVID) TABS; Take 3 tablets  by mouth 2 (two) times daily for 5 days. (Take nirmatrelvir 150 mg two tablets twice daily for 5 days and ritonavir 100 mg one tablet twice daily for 5 days) Patient GFR is 99  Dispense: 30 tablet; Refill: 0    Rest and push fluids Continue Ibuprofen as needed for fever and body aches Symptom management with [place] HBP for sinus congestion or similar product Seek emergency medical care for severe worsening or concerning symptoms Follow-up as needed   COVID-19 Education: The signs and symptoms of COVID-19 were discussed with the patient and how to seek care for testing (follow up with PCP or arrange E-visit). The importance of social distancing was discussed today.   I spent 7 minutes dedicated to the care of this patient on the date of this encounter to include face-to-face time with the patient, as well as: EMR and prescription medication management.  Follow Up:  In Person prn   I, Fonnie Mu as a scribe for Rip Harbour, NP.,have documented all relevant documentation on the behalf of Rip Harbour, NP,as directed by  Rip Harbour, NP while in the presence of Rip Harbour, NP.   I, Rip Harbour, NP, have reviewed all documentation for this visit. The documentation on 11/19/20 for the exam, diagnosis, procedures, and orders are all accurate and complete.    Varney Baas, DNP 11/19/2020 10:13 AM    [place]

## 2020-12-30 LAB — HM COLONOSCOPY

## 2021-01-25 ENCOUNTER — Other Ambulatory Visit: Payer: Self-pay

## 2021-01-25 MED ORDER — ATORVASTATIN CALCIUM 80 MG PO TABS: 80.0000 mg | ORAL_TABLET | Freq: Every day | ORAL | 0 refills | Status: DC

## 2021-01-25 NOTE — Telephone Encounter (Signed)
Last seen:07/27/20

## 2021-03-10 LAB — HM DIABETES EYE EXAM

## 2021-04-09 ENCOUNTER — Other Ambulatory Visit: Payer: Self-pay

## 2021-04-09 MED ORDER — ATORVASTATIN CALCIUM 80 MG PO TABS: 80.0000 mg | ORAL_TABLET | Freq: Every day | ORAL | 1 refills | Status: DC

## 2021-06-07 ENCOUNTER — Other Ambulatory Visit: Payer: Self-pay

## 2021-06-07 MED ORDER — TELMISARTAN-HCTZ 80-25 MG PO TABS: 1.0000 | ORAL_TABLET | Freq: Every day | ORAL | 0 refills | Status: DC

## 2021-06-07 MED ORDER — CITALOPRAM HYDROBROMIDE 20 MG PO TABS: 20.0000 mg | ORAL_TABLET | Freq: Every day | ORAL | 0 refills | Status: DC

## 2021-06-09 NOTE — Progress Notes (Signed)
Subjective:  Patient ID: Gerald Jordan, male    DOB: [birth year]  Age: 61 y.o. MRN: [id number]  Chief Complaint  Patient presents with   Hypertension   Diabetes   Hyperlipidemia   Hypothyroidism    HPI  Gerald Jordan is a 61 year old Caucasian male presents for follow-up for type 2 diabetes mellitus,hypertension, hyperlipidemia, and CAD. He was recently diagnosed with melanoma of left posterior shoulder that he had excised. He is followed by Dr John Giovanni, dermatologist. He had colonoscopy 01/2021, diverticulosis noted, two transverse colon polyps removed,repeat per GI recommendations. Gerald Jordan has OSA, he is adherent to using CPAP nightly. 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 Current treatment includes Xylatan gtts daily. He is followed by Dr Pamelia Hoit.   Diabetes Mellitus Type II, Follow-up  Lab Results  Component Value Date   HGBA1C 5.5 07/29/2020   HGBA1C 6.6 (H) 02/11/2020   HGBA1C 5.9 (H) 11/06/2019   Wt Readings from Last 3 Encounters:  11/19/20 259 lb (117.5 kg)  07/27/20 259 lb 3.2 oz (117.6 kg)  02/11/20 281 lb (127.5 kg)   Last seen for diabetes 11 months ago.  Management since then includes None. Diet Controlled.  Symptoms: No fatigue No foot ulcerations  No appetite changes No nausea  No paresthesia of the feet  No polydipsia  No polyuria No visual disturbances   No vomiting      Pertinent Labs: Lab Results  Component Value Date   CHOL 91 (L) 07/29/2020   HDL 33 (L) 07/29/2020   LDLCALC 40 07/29/2020   TRIG 90 07/29/2020   CHOLHDL 2.8 07/29/2020   Lab Results  Component Value Date   NA 140 07/29/2020   K 4.3 07/29/2020   CREATININE 0.89 07/29/2020   EGFR 99 07/29/2020   GFRNONAA 95 02/11/2020   GLUCOSE 110 (H) 07/29/2020      Hypertension, follow-up:  He was last seen for hypertension 11 months ago.  BP at that visit was 110/64. Management since that visit includes Coreg 6.25 mg daily, Micardis HCT 80-25 daily.  He reports  excellent compliance with treatment. He is not having side effects.  He is following a Regular diet. He is not exercising. He does not smoke.  Use of agents associated with hypertension: NSAIDS.    Lipid/Cholesterol, Follow-up  Last lipid panel Other pertinent labs  Lab Results  Component Value Date   CHOL 91 (L) 07/29/2020   HDL 33 (L) 07/29/2020   LDLCALC 40 07/29/2020   TRIG 90 07/29/2020   CHOLHDL 2.8 07/29/2020   Lab Results  Component Value Date   ALT 31 07/29/2020   AST 29 07/29/2020   PLT 160 07/29/2020   TSH 5.100 (H) 07/29/2020     He was last seen for this 11 months ago.  Management since that visit includes Lipitor 80 mg.  He reports excellent compliance with treatment. He is not having side effects.  Coronary artery disease, follow up  The patient was last seen for CAD on 0 He is taking daily aspirin.  He reports excellent compliance with treatment. He is not having side effects.  He is not having to take nitroglycerine. He is experiencing none. He is not experiencing none. He is able to carry groceries,     is able to climb stairs,      is able to cut grass,      is able to work in the yard without having above symptoms.  His last vist with his cardiologist was 09/28/20  with Dr Bishop Limbo. Per EMR review, last cardiology note: The patient had a STEMI on 08/12/2017.  Prior to the cath, he had polymorphic ventricular tachycardia requiring cardioversion.  He had VF twice during the heart cath procedure requiring defibrillation.  With his catheterization, he had subtotal occlusion of the proximal LAD.  This was treated with a drug-eluting stent with a good result initially.  However, there was early stent reocclusion requiring an additional balloon expansion.  The stent was postdilated with a 4.5 mm noncompliant balloon and good apposition was verified with intravascular ultrasound.  His LV systolic function was mildly reduced with EF 45 to 50% by angiogram.   Eventually, he recovered well from his heart attack.  Weight trend Wt Readings from Last 3 Encounters:  06/10/21 274 lb (124.3 kg)  11/19/20 259 lb (117.5 kg)  07/27/20 259 lb 3.2 oz (117.6 kg)     Current Outpatient Medications on File Prior to Visit  Medication Sig Dispense Refill   aspirin 81 MG tablet Take 81 mg by mouth daily.     atorvastatin (LIPITOR) 80 MG tablet Take 1 tablet (80 mg total) by mouth daily. 90 tablet 1   carvedilol (COREG) 6.25 MG tablet Take 1 tablet (6.25 mg total) by mouth 2 (two) times daily. 180 tablet 1   citalopram (CELEXA) 20 MG tablet Take 1 tablet (20 mg total) by mouth daily. 30 tablet 0   glucose blood test strip 1 each by Other route daily. Use as instructed 100 each 3   lamoTRIgine (LAMICTAL) 150 MG tablet Take 1 tablet (150 mg total) by mouth 2 (two) times daily. Take two tablets once a day. 180 tablet 1   latanoprost (XALATAN) 0.005 % ophthalmic solution Place 1 drop into both eyes nightly.     levothyroxine (SYNTHROID) 112 MCG tablet Take 1 tablet (112 mcg total) by mouth daily. 90 tablet 1   nitroGLYCERIN (NITROSTAT) 0.4 MG SL tablet Place 1 tablet (0.4 mg total) under the tongue every 5 (five) minutes as needed for chest pain. 25 tablet 0   tamsulosin (FLOMAX) 0.4 MG CAPS capsule Take 0.4 mg by mouth daily.     telmisartan-hydrochlorothiazide (MICARDIS HCT) 80-25 MG tablet Take 1 tablet by mouth daily. 30 tablet 0   No current facility-administered medications on file prior to visit.   Past Medical History:  Diagnosis Date   Atherosclerotic heart disease of native coronary artery without angina pectoris 2019   Ventricular fibrillation - cardioverted x 3 in ambulance. PTCA LAD 100%   Bradycardia    Hyperlipidemia    Hypertension    Hypertensive heart disease without heart failure    Morbid obesity ([place]) 08/05/2019   Myocardial infarction involving left main coronary artery (HCC)    Obstructive sleep apnea    Old myocardial infarction  08/12/2017   Type 2 diabetes mellitus (HCC)    Unspecified hearing loss, bilateral    Ventricular fibrillation [facility])    Past Surgical History:  Procedure Laterality Date   arm surgery  05/2014   CHOLECYSTECTOMY  02/2016   HERNIA REPAIR     1999   NASAL SINUS SURGERY  2000   tendon repair 11/2014      Family History  Problem Relation Age of Onset   Breast cancer Mother    Melanoma Father    Diabetes Sister    Arrhythmia Brother    Congestive Heart Failure Brother    Heart attack Brother    Atrial fibrillation Brother    Social  History   Socioeconomic History   Marital status: Married    Spouse name: Not on file   Number of children: 1   Years of education: Not on file   Highest education level: Not on file  Occupational History   Occupation: Freight forwarder  Tobacco Use   Smoking status: Never   Smokeless tobacco: Never  Vaping Use   Vaping Use: Never used  Substance and Sexual Activity   Alcohol use: Not Currently   Drug use: No   Sexual activity: Yes    Partners: Female  Other Topics Concern   Not on file  Social History Narrative   Not on file   Social Determinants of Health   Financial Resource Strain: Not on file  Food Insecurity: Not on file  Transportation Needs: Not on file  Physical Activity: Not on file  Stress: Not on file  Social Connections: Not on file    Review of Systems  Constitutional:  Negative for chills, diaphoresis, fatigue and fever.  HENT:  Negative for congestion, ear pain and sore throat.   Respiratory:  Negative for cough and shortness of breath.   Cardiovascular:  Negative for chest pain and leg swelling.  Gastrointestinal:  Negative for abdominal pain, constipation, diarrhea, nausea and vomiting.  Genitourinary:  Negative for dysuria and urgency.  Musculoskeletal:  Positive for neck pain (Injured at work seeing [facility] for therapy.). Negative for arthralgias, back pain and myalgias.  Neurological:  Negative for dizziness and  headaches.  Psychiatric/Behavioral:  Negative for dysphoric mood.     Objective:  BP 106/62    Pulse 61    Temp (!) 96.8 F (36 C)    Ht 5' 9" (1.753 m)    Wt 274 lb (124.3 kg)    SpO2 98%    BMI 40.46 kg/m    BP/Weight 11/19/2020 07/27/2020 1/51/7616  Systolic BP 073 710 626  Diastolic BP 60 64 80  Wt. (Lbs) 259 259.2 281  BMI 38.25 38.28 41.5    Physical Exam Vitals reviewed.  Constitutional:      Appearance: Normal appearance. He is normal weight.  Cardiovascular:     Rate and Rhythm: Normal rate and regular rhythm.     Heart sounds: No murmur heard. Pulmonary:     Effort: Pulmonary effort is normal.     Breath sounds: Normal breath sounds.  Abdominal:     General: Abdomen is flat. Bowel sounds are normal.     Palpations: Abdomen is soft.     Tenderness: There is no abdominal tenderness.  Neurological:     Mental Status: He is alert and oriented to person, place, and time.  Psychiatric:        Mood and Affect: Mood normal.        Behavior: Behavior normal.        Lab Results  Component Value Date   WBC 6.1 07/29/2020   HGB 14.7 07/29/2020   HCT 43.0 07/29/2020   PLT 160 07/29/2020   GLUCOSE 110 (H) 07/29/2020   CHOL 91 (L) 07/29/2020   TRIG 90 07/29/2020   HDL 33 (L) 07/29/2020   LDLCALC 40 07/29/2020   ALT 31 07/29/2020   AST 29 07/29/2020   NA 140 07/29/2020   K 4.3 07/29/2020   CL 101 07/29/2020   CREATININE 0.89 07/29/2020   BUN 13 07/29/2020   CO2 25 07/29/2020   TSH 5.100 (H) 07/29/2020   HGBA1C 5.5 07/29/2020   MICROALBUR 30 07/29/2020  Assessment & Plan:   1. Mixed hyperlipidemia - Comprehensive metabolic panel - Lipid panel - atorvastatin (LIPITOR) 80 MG tablet; Take 1 tablet (80 mg total) by mouth daily.  Dispense: 90 tablet; Refill: 3  2. Diabetic glomerulopathy (HCC) - CBC with Differential/Platelet - Hemoglobin A1c - glucose blood test strip; 1 each by Other route daily. Use as instructed  Dispense: 100 each; Refill: 3 -  Microalbumin/Creatinine Ratio, Urine  3. Secondary hypothyroidism - TSH - levothyroxine (SYNTHROID) 112 MCG tablet; Take 1 tablet (112 mcg total) by mouth daily.  Dispense: 90 tablet; Refill: 3  4. Hypertensive heart disease without heart failure - telmisartan-hydrochlorothiazide (MICARDIS HCT) 80-25 MG tablet; Take 1 tablet by mouth daily.  Dispense: 90 tablet; Refill: 0  5. Mild recurrent major depression (HCC) - citalopram (CELEXA) 20 MG tablet; Take 1 tablet (20 mg total) by mouth daily.  Dispense: 90 tablet; Refill: 3 - lamoTRIgine (LAMICTAL) 150 MG tablet; Take 1 tablet (150 mg total) by mouth 2 (two) times daily. Take two tablets once a day.  Dispense: 180 tablet; Refill: 3  6. Hypertension associated with type 2 diabetes mellitus (HCC) - Microalbumin/Creatinine Ratio, Urine  7. Coronary artery disease of native artery of native heart with stable angina pectoris (HCC) - carvedilol (COREG) 6.25 MG tablet; Take 1 tablet (6.25 mg total) by mouth 2 (two) times daily.  Dispense: 180 tablet; Refill: 3 - telmisartan-hydrochlorothiazide (MICARDIS HCT) 80-25 MG tablet; Take 1 tablet by mouth daily.  Dispense: 90 tablet; Refill: 0  8. Other glaucoma of both eyes - latanoprost (XALATAN) 0.005 % ophthalmic solution; Place 1 drop into both eyes nightly.  Dispense: 2.5 mL; Refill: 2    . Continue medications Continue heart healthy diet Continue daily physical activity Follow-up in 77-month for fasting physical       Follow-up: 645-monthfasting CPE  An After Visit Summary was printed and given to the patient.   I, ShRip HarbourNP, have reviewed all documentation for this visit. The documentation on 06/10/21 for the exam, diagnosis, procedures, and orders are all accurate and complete.    Signed, ShRip HarbourNP [phone]

## 2021-06-10 ENCOUNTER — Encounter: Payer: Self-pay | Admitting: Nurse Practitioner

## 2021-06-10 ENCOUNTER — Other Ambulatory Visit: Payer: Self-pay

## 2021-06-10 ENCOUNTER — Ambulatory Visit: Payer: Managed Care, Other (non HMO) | Admitting: Nurse Practitioner

## 2021-06-10 VITALS — BP 106/62 | HR 61 | Temp 96.8°F | Ht 69.0 in | Wt 274.0 lb

## 2021-06-10 DIAGNOSIS — I119 Hypertensive heart disease without heart failure: Secondary | ICD-10-CM

## 2021-06-10 DIAGNOSIS — E782 Mixed hyperlipidemia: Secondary | ICD-10-CM

## 2021-06-10 DIAGNOSIS — E1159 Type 2 diabetes mellitus with other circulatory complications: Secondary | ICD-10-CM

## 2021-06-10 DIAGNOSIS — E1121 Type 2 diabetes mellitus with diabetic nephropathy: Secondary | ICD-10-CM | POA: Diagnosis not present

## 2021-06-10 DIAGNOSIS — F330 Major depressive disorder, recurrent, mild: Secondary | ICD-10-CM

## 2021-06-10 DIAGNOSIS — I252 Old myocardial infarction: Secondary | ICD-10-CM

## 2021-06-10 DIAGNOSIS — E038 Other specified hypothyroidism: Secondary | ICD-10-CM

## 2021-06-10 DIAGNOSIS — H4089 Other specified glaucoma: Secondary | ICD-10-CM

## 2021-06-10 DIAGNOSIS — I152 Hypertension secondary to endocrine disorders: Secondary | ICD-10-CM

## 2021-06-10 DIAGNOSIS — G4733 Obstructive sleep apnea (adult) (pediatric): Secondary | ICD-10-CM

## 2021-06-10 DIAGNOSIS — I25118 Atherosclerotic heart disease of native coronary artery with other forms of angina pectoris: Secondary | ICD-10-CM

## 2021-06-10 DIAGNOSIS — K5730 Diverticulosis of large intestine without perforation or abscess without bleeding: Secondary | ICD-10-CM | POA: Insufficient documentation

## 2021-06-10 MED ORDER — LEVOTHYROXINE SODIUM 112 MCG PO TABS: 112.0000 ug | ORAL_TABLET | Freq: Every day | ORAL | 3 refills | Status: DC

## 2021-06-10 MED ORDER — GLUCOSE BLOOD VI STRP: 1.0000 | ORAL_STRIP | Freq: Every day | 3 refills | Status: DC

## 2021-06-10 MED ORDER — CARVEDILOL 6.25 MG PO TABS: 6.2500 mg | ORAL_TABLET | Freq: Two times a day (BID) | ORAL | 3 refills | Status: DC

## 2021-06-10 MED ORDER — LATANOPROST 0.005 % OP SOLN: OPHTHALMIC | 2 refills | Status: DC

## 2021-06-10 MED ORDER — TELMISARTAN-HCTZ 80-25 MG PO TABS: 1.0000 | ORAL_TABLET | Freq: Every day | ORAL | 0 refills | Status: DC

## 2021-06-10 MED ORDER — LAMOTRIGINE 150 MG PO TABS: 150.0000 mg | ORAL_TABLET | Freq: Two times a day (BID) | ORAL | 3 refills | Status: DC

## 2021-06-10 MED ORDER — CITALOPRAM HYDROBROMIDE 20 MG PO TABS: 20.0000 mg | ORAL_TABLET | Freq: Every day | ORAL | 3 refills | Status: DC

## 2021-06-10 MED ORDER — ATORVASTATIN CALCIUM 80 MG PO TABS: 80.0000 mg | ORAL_TABLET | Freq: Every day | ORAL | 3 refills | Status: DC

## 2021-06-10 NOTE — Patient Instructions (Signed)
Continue medications Continue heart healthy diet Continue daily physical activity Follow-up in 38-months for fasting physical    Coronary Artery Disease, Male Coronary artery disease (CAD) is a condition in which the arteries that lead to the heart (coronary arteries) become narrow or blocked. The narrowing or blockage can lead to decreased blood flow to the heart. Prolonged reduced blood flow can cause a heart attack (myocardial infarction or MI). This condition may also be called coronary heart disease. Because CAD is the leading cause of death in men, it is important to understand what causes this condition and how it is treated. What are the causes? CAD is most often caused by atherosclerosis. This is the buildup of fat and cholesterol (plaque) on the inside of the arteries. Over time, the plaque may narrow or block the artery, reducing blood flow to the heart. Plaque can also become weak and break off within a coronary artery and cause a sudden blockage. Other less common causes of CAD include: A blood clot or a piece of a blood clot or other substance that blocks the flow of blood in a coronary artery (embolism). A tearing of the artery (spontaneous coronary artery dissection). An enlargement of an artery (aneurysm). Inflammation (vasculitis) in the artery wall. What increases the risk? The following factors may make you more likely to develop this condition: Age. Men over age 94 are at a greater risk of CAD. Family history of CAD. Gender. Men often develop CAD earlier in life than women. High blood pressure (hypertension). Diabetes. High cholesterol levels. Tobacco use. Excessive alcohol use. Lack of exercise. A diet high in saturated and trans fats, such as fried food and processed meat. Other possible risk factors include: High stress levels. Depression. Obesity. Sleep apnea. What are the signs or symptoms? Many people do not have any symptoms during the early stages of CAD.  As the condition progresses, symptoms may include: Chest pain (angina). The pain can: Feel like crushing or squeezing, or like a tightness, pressure, fullness, or heaviness in the chest. Last more than a few minutes or can stop and recur. The pain tends to get worse with exercise or stress and to fade with rest. Pain in the arms, neck, jaw, ear, or back. Unexplained heartburn or indigestion. Shortness of breath. Nausea or vomiting. Sudden light-headedness. Sudden cold sweats. Fluttering or fast heartbeat (palpitations). How is this diagnosed? This condition is diagnosed based on: Your family and medical history. A physical exam. Tests, including: A test to check the electrical signals in your heart (electrocardiogram). Exercise stress test. This looks for signs of blockage when the heart is stressed with exercise, such as running on a treadmill. Pharmacologic stress test. This test looks for signs of blockage when the heart is being stressed with a medicine. Blood tests. Coronary angiogram. This is a procedure to look at the coronary arteries to see if there is any blockage. During this test, a dye is injected into your arteries so they appear on an X-ray. Coronary artery CT scan. This CT scan helps detect calcium deposits in your coronary arteries. Calcium deposits are an indicator of CAD. A test that uses sound waves to take a picture of your heart (echocardiogram). Chest X-ray. How is this treated? This condition may be treated by: Healthy lifestyle changes to reduce risk factors. Medicines such as: Antiplatelet medicines and blood-thinning medicines, such as aspirin. These help to prevent blood clots. Nitroglycerin. Blood pressure medicines. Cholesterol-lowering medicine. Coronary angioplasty and stenting. During this procedure, a thin,  flexible tube is inserted through a blood vessel and into a blocked artery. A balloon or similar device on the end of the tube is inflated to open  up the artery. In some cases, a small, mesh tube (stent) is inserted into the artery to keep it open. Coronary artery bypass surgery. During this surgery, veins or arteries from other parts of the body are used to create a bypass around the blockage and allow blood to reach your heart. Follow these instructions at home: Medicines Take over-the-counter and prescription medicines only as told by your health care provider. Do not take the following medicines unless your health care provider approves: NSAIDs, such as ibuprofen, naproxen, or celecoxib. Vitamin supplements that contain vitamin A, vitamin E, or both. Lifestyle Follow an exercise program approved by your health care provider. Aim for 150 minutes of moderate exercise or 75 minutes of vigorous exercise each week. Maintain a healthy weight or lose weight as approved by your health care provider. Learn to manage stress or try to limit your stress. Ask your health care provider for suggestions if you need help. Get screened for depression and seek treatment, if needed. Do not use any products that contain nicotine or tobacco, such as cigarettes, e-cigarettes, and chewing tobacco. If you need help quitting, ask your health care provider. Do not use illegal drugs. Eating and drinking  Follow a heart-healthy diet. A dietitian can help educate you about healthy food options and changes. In general, eat plenty of fruits and vegetables, lean meats, and whole grains. Avoid foods high in: Sugar. Salt (sodium). Saturated fat, such as processed or fatty meat. Trans fat, such as fried foods. Use healthy cooking methods such as roasting, grilling, broiling, baking, poaching, steaming, or stir-frying. Do not drink alcohol if your health care provider tells you not to drink. If you drink alcohol: Limit how much you have to 0-2 drinks per day. Be aware of how much alcohol is in your drink. In the [place] one drink equals one 12 oz bottle of beer (355  mL), one 5 oz glass of wine (148 mL), or one 1 oz glass of hard liquor (44 mL). General instructions Manage any other health conditions, such as hypertension and diabetes. These conditions affect your heart. Your health care provider may ask you to monitor your blood pressure. Ideally, your blood pressure should be below 130/80. Keep all follow-up visits as told by your health care provider. This is important. Get help right away if: You have pain in your chest, neck, ear, arm, jaw, stomach, or back that: Lasts more than a few minutes. Is recurring. Is not relieved by taking medicine under your tongue (sublingual nitroglycerin). You have profuse sweating without cause. You have unexplained: Heartburn or indigestion. Shortness of breath or difficulty breathing. Fluttering or fast heartbeat (palpitations). Nausea or vomiting. Fatigue. Feelings of nervousness or anxiety. Weakness. Diarrhea. You have sudden light-headedness or dizziness. You faint. You feel like hurting yourself or think about taking your own life. These symptoms may represent a serious problem that is an emergency. Do not wait to see if the symptoms will go away. Get medical help right away. Call your local emergency services (911 in the [place] Do not drive yourself to the hospital. Summary Coronary artery disease (CAD) is a condition in which the arteries that lead to the heart (coronary arteries) become narrow or blocked. The narrowing or blockage can lead to a heart attack. Many people do not have any symptoms during the early stages  of CAD. CAD can be treated with lifestyle changes, medicines, surgery, or a combination of these treatments. This information is not intended to replace advice given to you by your health care provider. Make sure you discuss any questions you have with your health care provider. Document Revised: 02/02/2018 Document Reviewed: 01/23/2018 Elsevier Patient Education  2022 [employer].

## 2021-06-11 LAB — MICROALBUMIN / CREATININE URINE RATIO
Creatinine, Urine: 188.5 mg/dL
Microalb/Creat Ratio: 5 mg/g creat (ref 0–29)
Microalbumin, Urine: 9.7 ug/mL

## 2021-06-11 LAB — COMPREHENSIVE METABOLIC PANEL
ALT: 90 IU/L — ABNORMAL HIGH (ref 0–44)
AST: 53 IU/L — ABNORMAL HIGH (ref 0–40)
Albumin/Globulin Ratio: 2.9 — ABNORMAL HIGH (ref 1.2–2.2)
Albumin: 4.9 g/dL (ref 3.8–4.9)
Alkaline Phosphatase: 76 IU/L (ref 44–121)
BUN/Creatinine Ratio: 16 (ref 10–24)
BUN: 14 mg/dL (ref 8–27)
Bilirubin Total: 1.1 mg/dL (ref 0.0–1.2)
CO2: 23 mmol/L (ref 20–29)
Calcium: 9.4 mg/dL (ref 8.6–10.2)
Chloride: 100 mmol/L (ref 96–106)
Creatinine, Ser: 0.9 mg/dL (ref 0.76–1.27)
Globulin, Total: 1.7 g/dL (ref 1.5–4.5)
Glucose: 135 mg/dL — ABNORMAL HIGH (ref 70–99)
Potassium: 4.7 mmol/L (ref 3.5–5.2)
Sodium: 138 mmol/L (ref 134–144)
Total Protein: 6.6 g/dL (ref 6.0–8.5)
eGFR: 98 mL/min/{1.73_m2} (ref >59)

## 2021-06-11 LAB — CBC WITH DIFFERENTIAL/PLATELET
Basophils Absolute: 0 10*3/uL (ref 0.0–0.2)
Basos: 0 %
EOS (ABSOLUTE): 0.1 10*3/uL (ref 0.0–0.4)
Eos: 1 %
Hematocrit: 44.7 % (ref 37.5–51.0)
Hemoglobin: 15.3 g/dL (ref 13.0–17.7)
Immature Grans (Abs): 0 10*3/uL (ref 0.0–0.1)
Immature Granulocytes: 0 %
Lymphocytes Absolute: 2.6 10*3/uL (ref 0.7–3.1)
Lymphs: 42 %
MCH: 31.1 pg (ref 26.6–33.0)
MCHC: 34.2 g/dL (ref 31.5–35.7)
MCV: 91 fL (ref 79–97)
Monocytes Absolute: 0.6 10*3/uL (ref 0.1–0.9)
Monocytes: 10 %
Neutrophils Absolute: 2.9 10*3/uL (ref 1.4–7.0)
Neutrophils: 47 %
Platelets: 194 10*3/uL (ref 150–450)
RBC: 4.92 x10E6/uL (ref 4.14–5.80)
RDW: 12.8 % (ref 11.6–15.4)
WBC: 6.2 10*3/uL (ref 3.4–10.8)

## 2021-06-11 LAB — CARDIOVASCULAR RISK ASSESSMENT

## 2021-06-11 LAB — TSH: TSH: 2.61 u[IU]/mL (ref 0.450–4.500)

## 2021-06-11 LAB — LIPID PANEL
Chol/HDL Ratio: 3.1 ratio (ref 0.0–5.0)
Cholesterol, Total: 96 mg/dL — ABNORMAL LOW (ref 100–199)
HDL: 31 mg/dL — ABNORMAL LOW (ref >39)
LDL Chol Calc (NIH): 47 mg/dL (ref 0–99)
Triglycerides: 92 mg/dL (ref 0–149)
VLDL Cholesterol Cal: 18 mg/dL (ref 5–40)

## 2021-06-11 LAB — HEMOGLOBIN A1C
Est. average glucose Bld gHb Est-mCnc: 140 mg/dL
Hgb A1c MFr Bld: 6.5 % — ABNORMAL HIGH (ref 4.8–5.6)

## 2021-06-18 ENCOUNTER — Other Ambulatory Visit: Payer: Self-pay

## 2021-06-18 DIAGNOSIS — I25118 Atherosclerotic heart disease of native coronary artery with other forms of angina pectoris: Secondary | ICD-10-CM

## 2021-06-18 MED ORDER — CARVEDILOL 6.25 MG PO TABS: 6.2500 mg | ORAL_TABLET | Freq: Two times a day (BID) | ORAL | 0 refills | Status: DC

## 2021-06-30 ENCOUNTER — Telehealth: Payer: Self-pay

## 2021-06-30 ENCOUNTER — Other Ambulatory Visit: Payer: Self-pay | Admitting: Family Medicine

## 2021-06-30 DIAGNOSIS — I119 Hypertensive heart disease without heart failure: Secondary | ICD-10-CM

## 2021-06-30 DIAGNOSIS — I25118 Atherosclerotic heart disease of native coronary artery with other forms of angina pectoris: Secondary | ICD-10-CM

## 2021-06-30 NOTE — Telephone Encounter (Signed)
I left a message on the number(s) listed in the patients chart requesting the patient to call back regarding the [place] appointment for 09/13/2021. The provider is out of the office that day. The appointment has been canceled. Waiting for the patient to return the call.

## 2021-08-30 ENCOUNTER — Other Ambulatory Visit: Payer: Self-pay | Admitting: Nurse Practitioner

## 2021-08-30 DIAGNOSIS — I25118 Atherosclerotic heart disease of native coronary artery with other forms of angina pectoris: Secondary | ICD-10-CM

## 2021-08-30 DIAGNOSIS — I119 Hypertensive heart disease without heart failure: Secondary | ICD-10-CM

## 2021-09-02 ENCOUNTER — Other Ambulatory Visit: Payer: Self-pay | Admitting: Nurse Practitioner

## 2021-09-02 DIAGNOSIS — I25118 Atherosclerotic heart disease of native coronary artery with other forms of angina pectoris: Secondary | ICD-10-CM

## 2021-09-02 DIAGNOSIS — I119 Hypertensive heart disease without heart failure: Secondary | ICD-10-CM

## 2021-09-13 ENCOUNTER — Ambulatory Visit: Payer: Managed Care, Other (non HMO) | Admitting: Nurse Practitioner

## 2021-09-17 ENCOUNTER — Encounter: Payer: Self-pay | Admitting: Nurse Practitioner

## 2021-09-17 ENCOUNTER — Ambulatory Visit (INDEPENDENT_AMBULATORY_CARE_PROVIDER_SITE_OTHER): Payer: Managed Care, Other (non HMO) | Admitting: Nurse Practitioner

## 2021-09-17 VITALS — BP 96/58 | HR 52 | Resp 18 | Ht 69.0 in | Wt 251.0 lb

## 2021-09-17 DIAGNOSIS — I252 Old myocardial infarction: Secondary | ICD-10-CM

## 2021-09-17 DIAGNOSIS — I119 Hypertensive heart disease without heart failure: Secondary | ICD-10-CM | POA: Diagnosis not present

## 2021-09-17 DIAGNOSIS — F330 Major depressive disorder, recurrent, mild: Secondary | ICD-10-CM | POA: Diagnosis not present

## 2021-09-17 DIAGNOSIS — E1159 Type 2 diabetes mellitus with other circulatory complications: Secondary | ICD-10-CM

## 2021-09-17 DIAGNOSIS — M542 Cervicalgia: Secondary | ICD-10-CM

## 2021-09-17 DIAGNOSIS — G8929 Other chronic pain: Secondary | ICD-10-CM

## 2021-09-17 DIAGNOSIS — Z85820 Personal history of malignant melanoma of skin: Secondary | ICD-10-CM

## 2021-09-17 DIAGNOSIS — I152 Hypertension secondary to endocrine disorders: Secondary | ICD-10-CM

## 2021-09-17 MED ORDER — TELMISARTAN 80 MG PO TABS: 80.0000 mg | ORAL_TABLET | Freq: Every day | ORAL | 0 refills | Status: DC

## 2021-09-17 NOTE — Patient Instructions (Signed)
Stop Micardis-HCT  ?Begin Micardis 80 mg  ?Monitor BP, keep log ?Return in 3-weeks for follow-up of BP ? ? ?Orthostatic Hypotension ?Blood pressure is a measurement of how strongly, or weakly, your circulating blood is pressing against the walls of your arteries. Orthostatic hypotension is a drop in blood pressure that can happen when you change positions, such as when you go from lying down to standing. ?Arteries are blood vessels that carry blood from your heart throughout your body. When blood pressure is too low, you may not get enough blood to your brain or to the rest of your organs. Orthostatic hypotension can cause light-headedness, sweating, rapid heartbeat, blurred vision, and fainting. These symptoms require further investigation into the cause. ?What are the causes? ?Orthostatic hypotension can be caused by many things, including: ?Sudden changes in posture, such as standing up quickly after you have been sitting or lying down. ?Loss of blood (anemia) or loss of body fluids (dehydration). ?Heart problems, neurologic problems, or hormone problems. ?Pregnancy. ?Aging. The risk for this condition increases as you get older. ?Severe infection (sepsis). ?Certain medicines, such as medicines for high blood pressure or medicines that make the body lose excess fluids (diuretics). ?What are the signs or symptoms? ?Symptoms of this condition may include: ?Weakness, light-headedness, or dizziness. ?Sweating. ?Blurred vision. ?Tiredness (fatigue). ?Rapid heartbeat. ?Fainting, in severe cases. ?How is this diagnosed? ?This condition is diagnosed based on: ?Your symptoms and medical history. ?Your blood pressure measurements. Your health care provider will check your blood pressure when you are: ?Lying down. ?Sitting. ?Standing. ?A blood pressure reading is recorded as two numbers, such as "120 over 80" (or 120/80). The first ("top") number is called the systolic pressure. It is a measure of the pressure in your  arteries as your heart beats. The second ("bottom") number is called the diastolic pressure. It is a measure of the pressure in your arteries when your heart relaxes between beats. Blood pressure is measured in a unit called mmHg. Healthy blood pressure for most adults is 120/80 mmHg. Orthostatic hypotension is defined as a 20 mmHg drop in systolic pressure or a 10 mmHg drop in diastolic pressure within 3 minutes of standing. ?Other information or tests that may be used to diagnose orthostatic hypotension include: ?Your other vital signs, such as your heart rate and temperature. ?Blood tests. ?An electrocardiogram (ECG) or echocardiogram. ?A Holter monitor. This is a device you wear that records your heart rhythm continuously, usually for 24-48 hours. ?Tilt table test. For this test, you will be safely secured to a table that moves you from a lying position to an upright position. Your heart rhythm and blood pressure will be monitored during the test. ?How is this treated? ?This condition may be treated by: ?Changing your diet. This may involve eating more salt (sodium) or drinking more water. ?Changing the dosage of certain medicines you are taking that might be lowering your blood pressure. ?Correcting the underlying reason for the orthostatic hypotension. ?Wearing compression stockings. ?Taking medicines to raise your blood pressure. ?Avoiding actions that trigger symptoms. ?Follow these instructions at home: ?Medicines ?Take over-the-counter and prescription medicines only as told by your health care provider. ?Follow instructions from your health care provider about changing the dosage of your current medicines, if this applies. ?Do not stop or adjust any of your medicines on your own. ?Eating and drinking ? ?Drink enough fluid to keep your urine pale yellow. ?Eat extra salt only as directed. Do not add extra salt to your  diet unless advised by your health care provider. ?Eat frequent, small meals. ?Avoid  standing up suddenly after eating. ?General instructions ? ?Get up slowly from lying down or sitting positions. This gives your blood pressure a chance to adjust. ?Avoid hot showers and excessive heat as directed by your health care provider. ?Engage in regular physical activity as directed by your health care provider. ?If you have compression stockings, wear them as told. ?Keep all follow-up visits. This is important. ?Contact a health care provider if: ?You have a fever for more than 2-3 days. ?You feel more thirsty than usual. ?You feel dizzy or weak. ?Get help right away if: ?You have chest pain. ?You have a fast or irregular heartbeat. ?You become sweaty or feel light-headed. ?You feel short of breath. ?You faint. ?You have any symptoms of a stroke. "BE FAST" is an easy way to remember the main warning signs of a stroke: ?B - Balance. Signs are dizziness, sudden trouble walking, or loss of balance. ?E - Eyes. Signs are trouble seeing or a sudden change in vision. ?F - Face. Signs are sudden weakness or numbness of the face, or the face or eyelid drooping on one side. ?A - Arms. Signs are weakness or numbness in an arm. This happens suddenly and usually on one side of the body. ?S - Speech. Signs are sudden trouble speaking, slurred speech, or trouble understanding what people say. ?T - Time. Time to call emergency services. Write down what time symptoms started. ?You have other signs of a stroke, such as: ?A sudden, severe headache with no known cause. ?Nausea or vomiting. ?Seizure. ?These symptoms may represent a serious problem that is an emergency. Do not wait to see if the symptoms will go away. Get medical help right away. Call your local emergency services (911 in the [place] Do not drive yourself to the hospital. ?Summary ?Orthostatic hypotension is a sudden drop in blood pressure. ?It can cause light-headedness, sweating, rapid heartbeat, blurred vision, and fainting. ?Orthostatic hypotension can be  diagnosed by having your blood pressure taken while lying down, sitting, and then standing. ?Treatment may involve changing your diet, wearing compression stockings, sitting up slowly, adjusting your medicines, or correcting the underlying reason for the orthostatic hypotension. ?Get help right away if you have chest pain, a fast or irregular heartbeat, or symptoms of a stroke. ?This information is not intended to replace advice given to you by your health care provider. Make sure you discuss any questions you have with your health care provider. ?Document Revised: 07/30/2020 Document Reviewed: 07/30/2020 ?Elsevier Patient Education ? [place]. ? ?

## 2021-09-17 NOTE — Progress Notes (Signed)
? ?Subjective:  ?Patient ID: Gerald Jordan, male    DOB: June 05, 1960  Age: 61 y.o. MRN: [id number] ? ?Chief Complaint  ?Patient presents with  ? Hypertension  ? Hyperlipidemia  ? Diabetes  ? Hypothyroidism  ? ? ?HPI ?Gerald Jordan is a 61 year old Caucasian male that presents for follow-up of hypertension, type 2 diabetes mellitus, and hyperlipidemia. 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 He is followed by Dr Bishop Limbo, cardiologist. F/u appt 10/11/21. ? ?Gerald Jordan has lost 23 lbs since last visit 27-month ago. States he is following [place] diet and supplement program. Reports lightheadedness and dizziness with position changes. BP in office today 96/52, pulse 52. States he is pushing fluids, especially water.  ? ?Gerald Jordan chronic neck pain due to bulging disk that he sustained as a on-the-job injury after lifting something heavy. He tells me that he is being [place] States he has undergone PT. Reports that he was given option for surgical intervention or "learn to live with it". States he continues to perform PT neck exercises at home that has shown some benefit. He tells me that he has 2 years from the date of injury to opt for surgical management.  ? ?Last PSA 0.5 on 07/29/20. Colonoscopy 12/30/20, repeat 2032. 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 ?Hypertension, follow-up: ?He was last seen for hypertension 3 months ago.  ?BP at that visit was 106/62. Management includes Coreg 6.25 BID, Micardis 80/25 daily. ? ?He reports excellent compliance with treatment. ?He is not having side effects.  ?He is following a Low Sodium, Diabetic diet. ?He is exercising. ?He does not smoke. ? ?Use of agents associated with hypertension: steroids.  ? ?Outside blood pressures are not being checked ?.Marland Kitchen?Symptoms: ?No chest pain No chest pressure  ?No palpitations No syncope  ?No dyspnea No orthopnea  ?No paroxysmal  nocturnal dyspnea No lower extremity edema  ? ?Pertinent labs: ?Lab Results  ?Component Value Date  ? CHOL 96 (L) 06/10/2021  ? HDL 31 (L) 06/10/2021  ? LP[place]47 06/10/2021  ? TRIG 92 06/10/2021  ? CHOLHDL 3.1 06/10/2021  ? Lab Results  ?Component Value Date  ? NA 138 06/10/2021  ? K 4.7 06/10/2021  ? CREATININE 0.90 06/10/2021  ? EGFR 98 06/10/2021  ? GFRNONAA 95 02/11/2020  ? GLUCOSE 135 (H) 06/10/2021  ?  ? ?Diabetes Mellitus Type II, Follow-up ? ?Lab Results  ?Component Value Date  ? HGBA1C 6.5 (H) 06/10/2021  ? HGBA1C 5.5 07/29/2020  ? HGBA1C 6.6 (H) 02/11/2020  ? ?Wt Readings from Last 3 Encounters:  ?09/17/21 251 lb (113.9 kg)  ?06/10/21 274 lb (124.3 kg)  ?11/19/20 259 lb (117.5 kg)  ? ?Last seen for diabetes 3 months ago.  ?Management since includes diet controlled. ?He reports excellent compliance with treatment. ?He is not having side effects.  ?Symptoms: ?No fatigue No foot ulcerations  ?No appetite changes No nausea  ?No paresthesia of the feet  No polydipsia  ?No polyuria No visual disturbances   ?No vomiting   ? ? ?Home blood sugar records:  110-130 ?         Episodes of hypoglycemia? No  ?Current insulin regiment:none ?Current exercise: gardening and yard work ?Current diet habits: in general, a "healthy" diet   ? ?Pertinent Labs: ?Lab Results  ?Component Value Date  ? CHOL 96 (L) 06/10/2021  ? HDL 31 (L) 06/10/2021  ?  [place] 47 06/10/2021  ? TRIG 92 06/10/2021  ? CHOLHDL 3.1 06/10/2021  ? Lab Results  ?Component Value Date  ? NA 138 06/10/2021  ? K 4.7 06/10/2021  ? CREATININE 0.90 06/10/2021  ? EGFR 98 06/10/2021  ? GFRNONAA 95 02/11/2020  ? GLUCOSE 135 (H) 06/10/2021  ?  ?  ?Lipid/Cholesterol, Follow-up ? ?Last lipid panel Other pertinent labs  ?Lab Results  ?Component Value Date  ? CHOL 96 (L) 06/10/2021  ? HDL 31 (L) 06/10/2021  ? [place] 47 06/10/2021  ? TRIG 92 06/10/2021  ? CHOLHDL 3.1 06/10/2021  ? Lab Results  ?Component Value Date  ? ALT 90 (H) 06/10/2021  ? AST 53 (H) 06/10/2021  ? PLT  194 06/10/2021  ? TSH 2.610 06/10/2021  ?  ? ?He was last seen for this 3 months ago.  ?Management includes Lipitor 80 mg. ? ?He reports excellent compliance with treatment. ?He is not having side effects.  ? ?Symptoms: ?No chest pain No chest pressure/discomfort  ?No dyspnea No lower extremity edema  ?No numbness or tingling of extremity No orthopnea  ?No palpitations No paroxysmal nocturnal dyspnea  ?No speech difficulty No syncope  ? ?Current diet: in general, a "healthy" diet   ?Current exercise: gardening and yard work ? ?  ? ?Hypothyroidism ?- Medications: Synthroid 112 mcg daily ?- Current symptoms:  none ?- Symptoms have essentially resolved ? ? ?Depression, Follow-up ? ?He  was last seen for this 3 months ago. ?Current treatment includes Citalopram 20 mg ?  ?He reports excellent compliance with treatment. ?He is not having side effects.  ? ?He reports excellent tolerance of treatment. ?Current symptoms include: None ?He feels he is  stable  since last visit. ? ? ?  06/10/2021  ?  9:24 AM 07/27/2020  ?  2:17 PM 08/05/2019  ? 12:57 PM  ?Depression screen PHQ 2/9  ?Decreased Interest 0 0 0  ?Down, Depressed, Hopeless 0 0 0  ?PHQ - 2 Score 0 0 0  ?   ?  ?Current Outpatient Medications on File Prior to Visit  ?Medication Sig Dispense Refill  ? aspirin 81 MG tablet Take 81 mg by mouth daily.    ? atorvastatin (LIPITOR) 80 MG tablet Take 1 tablet (80 mg total) by mouth daily. 90 tablet 3  ? carvedilol (COREG) 6.25 MG tablet Take 1 tablet (6.25 mg total) by mouth 2 (two) times daily. 14 tablet 0  ? citalopram (CELEXA) 20 MG tablet Take 1 tablet (20 mg total) by mouth daily. 90 tablet 3  ? glucose blood test strip 1 each by Other route daily. Use as instructed 100 each 3  ? lamoTRIgine (LAMICTAL) 150 MG tablet Take 1 tablet (150 mg total) by mouth 2 (two) times daily. Take two tablets once a day. 180 tablet 3  ? latanoprost (XALATAN) 0.005 % ophthalmic solution Place 1 drop into both eyes nightly. 2.5 mL 2  ?  levothyroxine (SYNTHROID) 112 MCG tablet Take 1 tablet (112 mcg total) by mouth daily. 90 tablet 3  ? nitroGLYCERIN (NITROSTAT) 0.4 MG SL tablet Place 1 tablet (0.4 mg total) under the tongue every 5 (five) minutes as needed for chest pain. 25 tablet 0  ? telmisartan-hydrochlorothiazide (MICARDIS HCT) 80-25 MG tablet TAKE 1 TABLET DAILY 90 tablet 3  ? ?No current facility-administered medications on file prior to visit.  ? ?Past Medical History:  ?Diagnosis Date  ? 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 Ventricular fibrillation -  cardioverted x 3 in ambulance. PTCA LAD 100%  ? Bradycardia   ? Hyperlipidemia   ? Hypertension   ? Hypertensive heart disease without heart failure   ? Morbid obesity ([place]) 08/05/2019  ? Myocardial infarction involving left main coronary artery ([place])   ? Obstructive sleep apnea   ? 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 Type 2 diabetes mellitus ([place])   ? Unspecified hearing loss, bilateral   ? Ventricular fibrillation ([place])   ? ?Past Surgical History:  ?Procedure Laterality Date  ? arm surgery  05/2014  ? CHOLECYSTECTOMY  02/2016  ? HERNIA REPAIR    ? 1999  ? NASAL SINUS SURGERY  2000  ? tendon repair 11/2014    ?  ?Family History  ?Problem Relation Age of Onset  ? Breast cancer Mother   ? Melanoma Father   ? Diabetes Sister   ? Arrhythmia Brother   ? Congestive Heart Failure Brother   ? Heart attack Brother   ? Atrial fibrillation Brother   ? ?Social History  ? ?Socioeconomic History  ? Marital status: Married  ?  Spouse name: Not on file  ? Number of children: 1  ? Years of education: Not on file  ? Highest education level: Not on file  ?Occupational History  ? Occupation: Freight forwarder  ?Tobacco Use  ? Smoking status: Never  ? Smokeless tobacco: Never  ?Vaping Use  ? Vaping Use: Never used  ?Substance and Sexual Activity  ? Alcohol use: Not Currently  ? Drug use: No  ? Sexual activity: Yes  ?  Partners: Female  ?Other Topics Concern  ? Not on  file  ?Social History Narrative  ? Not on file  ? ?Social Determinants of Health  ? ?Financial Resource Strain: Not on file  ?Food Insecurity: Not on file  ?Transportation Needs: Not on file  ?Physical Activity: Not

## 2021-09-18 LAB — COMPREHENSIVE METABOLIC PANEL
ALT: 42 IU/L (ref 0–44)
AST: 36 IU/L (ref 0–40)
Albumin/Globulin Ratio: 2.6 — ABNORMAL HIGH (ref 1.2–2.2)
Albumin: 4.6 g/dL (ref 3.8–4.8)
Alkaline Phosphatase: 98 IU/L (ref 44–121)
BUN/Creatinine Ratio: 16 (ref 10–24)
BUN: 14 mg/dL (ref 8–27)
Bilirubin Total: 1 mg/dL (ref 0.0–1.2)
CO2: 25 mmol/L (ref 20–29)
Calcium: 9.1 mg/dL (ref 8.6–10.2)
Chloride: 99 mmol/L (ref 96–106)
Creatinine, Ser: 0.9 mg/dL (ref 0.76–1.27)
Globulin, Total: 1.8 g/dL (ref 1.5–4.5)
Glucose: 109 mg/dL — ABNORMAL HIGH (ref 70–99)
Potassium: 4.4 mmol/L (ref 3.5–5.2)
Sodium: 136 mmol/L (ref 134–144)
Total Protein: 6.4 g/dL (ref 6.0–8.5)
eGFR: 97 mL/min/{1.73_m2} (ref >59)

## 2021-09-18 LAB — CBC WITH DIFFERENTIAL/PLATELET
Basophils Absolute: 0 10*3/uL (ref 0.0–0.2)
Basos: 0 %
EOS (ABSOLUTE): 0.1 10*3/uL (ref 0.0–0.4)
Eos: 2 %
Hematocrit: 41.2 % (ref 37.5–51.0)
Hemoglobin: 14.7 g/dL (ref 13.0–17.7)
Immature Grans (Abs): 0.1 10*3/uL (ref 0.0–0.1)
Immature Granulocytes: 1 %
Lymphocytes Absolute: 2 10*3/uL (ref 0.7–3.1)
Lymphs: 34 %
MCH: 32.3 pg (ref 26.6–33.0)
MCHC: 35.7 g/dL (ref 31.5–35.7)
MCV: 91 fL (ref 79–97)
Monocytes Absolute: 0.7 10*3/uL (ref 0.1–0.9)
Monocytes: 12 %
Neutrophils Absolute: 3.1 10*3/uL (ref 1.4–7.0)
Neutrophils: 51 %
Platelets: 170 10*3/uL (ref 150–450)
RBC: 4.55 x10E6/uL (ref 4.14–5.80)
RDW: 12.4 % (ref 11.6–15.4)
WBC: 6.1 10*3/uL (ref 3.4–10.8)

## 2021-09-18 LAB — LIPID PANEL
Chol/HDL Ratio: 2.5 ratio (ref 0.0–5.0)
Cholesterol, Total: 87 mg/dL — ABNORMAL LOW (ref 100–199)
HDL: 35 mg/dL — ABNORMAL LOW (ref >39)
LDL Chol Calc (NIH): 38 mg/dL (ref 0–99)
Triglycerides: 57 mg/dL (ref 0–149)
VLDL Cholesterol Cal: 14 mg/dL (ref 5–40)

## 2021-09-18 LAB — HEMOGLOBIN A1C
Est. average glucose Bld gHb Est-mCnc: 108 mg/dL
Hgb A1c MFr Bld: 5.4 % (ref 4.8–5.6)

## 2021-09-18 LAB — CARDIOVASCULAR RISK ASSESSMENT

## 2021-09-20 ENCOUNTER — Ambulatory Visit: Payer: Managed Care, Other (non HMO) | Admitting: Nurse Practitioner

## 2021-09-20 ENCOUNTER — Telehealth: Payer: Self-pay

## 2021-09-20 NOTE — Telephone Encounter (Signed)
Patient called stating his bp dropped yesterday and was concerned about rather he needs to continue Coreg and Micardis. Offered to send a message to provider since he was last seen 4/21, however he requested an appt instead for his peace of mind. ?

## 2021-10-08 ENCOUNTER — Encounter: Payer: Self-pay | Admitting: Nurse Practitioner

## 2021-10-08 ENCOUNTER — Ambulatory Visit (INDEPENDENT_AMBULATORY_CARE_PROVIDER_SITE_OTHER): Payer: Managed Care, Other (non HMO) | Admitting: Nurse Practitioner

## 2021-10-08 VITALS — BP 130/80 | HR 53 | Temp 97.2°F | Ht 69.0 in | Wt 253.0 lb

## 2021-10-08 DIAGNOSIS — I119 Hypertensive heart disease without heart failure: Secondary | ICD-10-CM

## 2021-10-08 DIAGNOSIS — I152 Hypertension secondary to endocrine disorders: Secondary | ICD-10-CM

## 2021-10-08 DIAGNOSIS — E1159 Type 2 diabetes mellitus with other circulatory complications: Secondary | ICD-10-CM

## 2021-10-08 NOTE — Patient Instructions (Signed)
Continue medications ?Follow-up at 12/09/21 at 8:00 am ? ? ? ?Preventing Hypertension ?Hypertension, also called high blood pressure, is when the force of blood pumping through the arteries is too strong. Arteries are blood vessels that carry blood from the heart throughout the body. Often, hypertension does not cause symptoms until blood pressure is very high. It is important to have your blood pressure checked regularly. ?Diet and lifestyle changes can help you prevent hypertension, and they may make you feel better overall and improve your quality of life. If you already have hypertension, you may control it with diet and lifestyle changes, as well as with medicine. ?How can this condition affect me? ?Over time, hypertension can damage the arteries and decrease blood flow to important parts of the body, including the brain, heart, and kidneys. By keeping your blood pressure in a healthy range, you can help prevent complications like heart attack, heart failure, stroke, kidney failure, and vascular dementia. ?What can increase my risk? ?An unhealthy diet and a lack of physical activity can make you more likely to develop high blood pressure. Some other risk factors include: ?Age. The risk increases with age. ?Having family members who have had high blood pressure. ?Having certain health conditions, such as thyroid problems. ?Being overweight or obese. ?Drinking too much alcohol or caffeine. ?Having too much fat, sugar, calories, or salt (sodium) in your diet. ?Smoking or using illegal drugs. ?Taking certain medicines, such as antidepressants, decongestants, birth control pills, and NSAIDs, such as ibuprofen. ?What actions can I take to prevent or manage this condition? ?Work with your health care provider to make a hypertension prevention plan that works for you. You may be referred for counseling on a healthy diet and physical activity. Follow your plan and keep all follow-up visits. ?Diet changes ?Maintain a  healthy diet. This includes: ?Eating less salt (sodium). Ask your health care provider how much sodium is safe for you to have. The general recommendation is to have less than 1 tsp (2,300 mg) of sodium a day. ?Do not add salt to your food. ?Choose low-sodium options when grocery shopping and eating out. ?Limiting fats in your diet. You can do this by eating low-fat or fat-free dairy products and by eating less red meat. ?Eating more fruits, vegetables, and whole grains. Make a goal to eat: ?1?-2 cups of fresh fruits and vegetables each day. ?3-4 servings of whole grains each day. ?Avoiding foods and beverages that have added sugars. ?Eating fish that contain healthy fats (omega-3 fatty acids), such as mackerel or salmon. ?If you need help putting together a healthy eating plan, try the DASH diet. This diet is high in fruits, vegetables, and whole grains. It is low in sodium, red meat, and added sugars. DASH stands for Dietary Approaches to Stop Hypertension. ?Lifestyle changes ? ?Lose weight if you are overweight. Losing just 3-5% of your body weight can help prevent or control hypertension. For example, if your present weight is 200 lb (91 kg), a loss of 3-5% of your weight means losing 6-10 lb (2.7-4.5 kg). Ask your health care provider to help you with a diet and exercise plan to safely lose weight. ?Get enough exercise. Do at least 150 minutes of moderate-intensity exercise each week. You could do this in short exercise sessions several times a day, or you could do longer exercise sessions a few times a week. For example, you could take a brisk 10-minute walk or bike ride, 3 times a day, for 5 days a week. ?  Find ways to reduce stress, such as exercising, meditating, listening to music, or taking a yoga class. If you need help reducing stress, ask your health care provider. ?Do not use any products that contain nicotine or tobacco. These products include cigarettes, chewing tobacco, and vaping devices, such as  e-cigarettes. Chemicals in tobacco and nicotine products raise your blood pressure each time you use them. If you need help quitting, ask your health care provider. ?Learn how to check your blood pressure at home. Make sure that you know your personal target blood pressure, as told by your health care provider. ?Try to sleep 7-9 hours per night. ?Alcohol use ?Do not drink alcohol if: ?Your health care provider tells you not to drink. ?You are pregnant, may be pregnant, or are planning to become pregnant. ?If you drink alcohol: ?Limit how much you have to: ?0-1 drink a day for women. ?0-2 drinks a day for men. ?Know how much alcohol is in your drink. In the [place] one drink equals one 12 oz bottle of beer (355 mL), one 5 oz glass of wine (148 mL), or one 1? oz glass of hard liquor (44 mL). ?Medicines ?In addition to diet and lifestyle changes, your health care provider may recommend medicines to help lower your blood pressure. In general: ?You may need to try a few different medicines to find what works best for you. ?You may need to take more than one medicine. ?Take over-the-counter and prescription medicines only as told by your health care provider. ?Questions to ask your health care provider ?What is my blood pressure goal? ?How can I lower my risk for high blood pressure? ?How should I monitor my blood pressure at home? ?Where to find support ?Your health care provider can help you prevent hypertension and help you keep your blood pressure at a healthy level. Your local hospital or your community may also provide support services and prevention programs. ?The American Heart Association offers an online support network at supportnetwork.heart.org ?Where to find more information ?Learn more about hypertension from: ?National Heart, Lung, and Blood Institute: https://wilson-eaton.com/ ?Centers for Disease Control and Prevention: http://www.wolf.info/ ?American Academy of Family Physicians: familydoctor.org ?Learn more about the DASH  diet from: ?National Heart, Lung, and Blood Institute: https://wilson-eaton.com/ ?Contact a health care provider if: ?You think you are having a reaction to medicines you have taken. ?You have recurrent headaches or feel dizzy. ?You have swelling in your ankles. ?You have trouble with your vision. ?Get help right away if: ?You have sudden, severe chest, back, or abdominal pain or discomfort. ?You have shortness of breath. ?You have a sudden, severe headache. ?These symptoms may be an emergency. Get help right away. Call 911. ?Do not wait to see if the symptoms will go away. ?Do not drive yourself to the hospital. ?Summary ?Hypertension often does not cause any symptoms until blood pressure is very high. It is important to get your blood pressure checked regularly. ?Diet and lifestyle changes are important steps in preventing hypertension. ?By keeping your blood pressure in a healthy range, you may prevent complications like heart attack, heart failure, stroke, and kidney failure. ?Work with your health care provider to make a hypertension prevention plan that works for you. ?This information is not intended to replace advice given to you by your health care provider. Make sure you discuss any questions you have with your health care provider. ?Document Revised: 03/04/2021 Document Reviewed: 03/04/2021 ?Elsevier Patient Education ? [place]. ? ?

## 2021-10-08 NOTE — Progress Notes (Signed)
? ?Subjective:  ?Patient ID: [name], male    DOB: June 04, 1960  Age: 61 y.o. MRN: [id number] ? ?Chief Complaint  ?Patient presents with  ? Hypertension  ? ? ?HPI ?  ? ?Gerald Jordan is a 61 year old male that presents for follow-up of BP after medication adjustment. 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 He has modifed his diet and increased physical activity. Lost 23 lbs intentionally over the past 15-month. He was experiencing low BP, intermittent dizziness, and lightheadedness.  ? ?He was last seen for hypertension 3 weeks ago.  ?BP at that visit was 96/58. Management since that visit includes coreg and micardis, discontinued HCTZ ? ?He reports excellent compliance with treatment. ?He is not having side effects.  ?He is following a Regular diet. ?He is exercising. ?He does not smoke. ? ?Use of agents associated with hypertension: thyroid hormones.  ? ?Outside blood pressures are 110/54 109/54, 115/57, 107/53, 121/61, 133/63, 124/70, 111/66, 125/66, 108/56, 125/68, 126/67, 126/64, 129/71, 127/65, 121/60,125/61, 120/61, 130/68, 120/67, 124/63, 129/67, 130/66 ? ?Pertinent labs: ?Lab Results  ?Component Value Date  ? CHOL 87 (L) 09/17/2021  ? HDL 35 (L) 09/17/2021  ? LDLCALC 38 09/17/2021  ? TRIG 57 09/17/2021  ? CHOLHDL 2.5 09/17/2021  ? Lab Results  ?Component Value Date  ? NA 136 09/17/2021  ? K 4.4 09/17/2021  ? CREATININE 0.90 09/17/2021  ? EGFR 97 09/17/2021  ? GFRNONAA 95 02/11/2020  ? GLUCOSE 109 (H) 09/17/2021  ?  ? ?  ? ? ?Current Outpatient Medications on File Prior to Visit  ?Medication Sig Dispense Refill  ? aspirin 81 MG tablet Take 81 mg by mouth daily.    ? atorvastatin (LIPITOR) 80 MG tablet Take 1 tablet (80 mg total) by mouth daily. 90 tablet 3  ? carvedilol (COREG) 6.25 MG tablet Take 1 tablet (6.25 mg total) by mouth 2 (two) times daily. 14 tablet 0  ? citalopram (CELEXA) 20 MG tablet Take 1 tablet (20 mg total) by mouth daily. 90 tablet 3  ? glucose  blood test strip 1 each by Other route daily. Use as instructed 100 each 3  ? lamoTRIgine (LAMICTAL) 150 MG tablet Take 1 tablet (150 mg total) by mouth 2 (two) times daily. Take two tablets once a day. 180 tablet 3  ? latanoprost (XALATAN) 0.005 % ophthalmic solution Place 1 drop into both eyes nightly. 2.5 mL 2  ? levothyroxine (SYNTHROID) 112 MCG tablet Take 1 tablet (112 mcg total) by mouth daily. 90 tablet 3  ? nitroGLYCERIN (NITROSTAT) 0.4 MG SL tablet Place 1 tablet (0.4 mg total) under the tongue every 5 (five) minutes as needed for chest pain. 25 tablet 0  ? telmisartan (MICARDIS) 80 MG tablet Take 1 tablet (80 mg total) by mouth daily. 90 tablet 0  ? ?No current facility-administered medications on file prior to visit.  ? ?Past Medical History:  ?Diagnosis Date  ? 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 Ventricular fibrillation - cardioverted x 3 in ambulance. PTCA LAD 100%  ? Bradycardia   ? Hyperlipidemia   ? Hypertension   ? Hypertensive heart disease without heart failure   ? Morbid obesity ([facility] 08/05/2019  ? Myocardial infarction involving left main coronary artery ([facility]   ? Obstructive sleep apnea   ? 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 Type 2 diabetes mellitus ([facility]   ? Unspecified hearing loss, bilateral   ? Ventricular fibrillation ([place]   ? ?  Past Surgical History:  ?Procedure Laterality Date  ? arm surgery  05/2014  ? CHOLECYSTECTOMY  02/2016  ? HERNIA REPAIR    ? 1999  ? NASAL SINUS SURGERY  2000  ? tendon repair 11/2014    ?  ?Family History  ?Problem Relation Age of Onset  ? Breast cancer Mother   ? Melanoma Father   ? Diabetes Sister   ? Arrhythmia Brother   ? Congestive Heart Failure Brother   ? Heart attack Brother   ? Atrial fibrillation Brother   ? ?Social History  ? ?Socioeconomic History  ? Marital status: Married  ?  Spouse name: Not on file  ? Number of children: 1  ? Years of education: Not on file  ? Highest education level: Not on  file  ?Occupational History  ? Occupation: Freight forwarder  ?Tobacco Use  ? Smoking status: Never  ? Smokeless tobacco: Never  ?Vaping Use  ? Vaping Use: Never used  ?Substance and Sexual Activity  ? Alcohol use: Not Currently  ? Drug use: No  ? Sexual activity: Yes  ?  Partners: Female  ?Other Topics Concern  ? Not on file  ?Social History Narrative  ? Not on file  ? ?Social Determinants of Health  ? ?Financial Resource Strain: Not on file  ?Food Insecurity: Not on file  ?Transportation Needs: Not on file  ?Physical Activity: Not on file  ?Stress: Not on file  ?Social Connections: Not on file  ? ? ?Review of Systems ?See pertinent positives and negatives per HPI.  ? ?Objective:  ?BP 130/80   Pulse (!) 53   Temp (!) 97.2 ?F (36.2 ?C)   Ht _0  (1.753 m)   Wt 253 lb (114.8 kg)   SpO2 99%   BMI 37.36 kg/m?   ? ? ?  10/08/2021  ?  8:08 AM 09/17/2021  ?  8:25 AM 06/10/2021  ?  9:22 AM  ?BP/Weight  ?Systolic BP  96 102  ?Diastolic BP  58 62  ?Wt. (Lbs) 253 251 274  ?BMI 37.36 kg/m2 37.07 kg/m2 40.46 kg/m2  ? ? ?Physical Exam ?Vitals reviewed.  ?Cardiovascular:  ?   Rate and Rhythm: Regular rhythm. Bradycardia present.  ?Pulmonary:  ?   Effort: Pulmonary effort is normal.  ?   Breath sounds: Normal breath sounds.  ?Skin: ?   General: Skin is warm and dry.  ?   Capillary Refill: Capillary refill takes less than 2 seconds.  ?Neurological:  ?   General: No focal deficit present.  ?   Mental Status: He is alert and oriented to person, place, and time.  ?Psychiatric:     ?   Mood and Affect: Mood normal.     ?   Behavior: Behavior normal.  ? ? ? ?  ? ?Lab Results  ?Component Value Date  ? WBC 6.1 09/17/2021  ? HGB 14.7 09/17/2021  ? HCT 41.2 09/17/2021  ? PLT 170 09/17/2021  ? GLUCOSE 109 (H) 09/17/2021  ? CHOL 87 (L) 09/17/2021  ? TRIG 57 09/17/2021  ? HDL 35 (L) 09/17/2021  ? LDLCALC 38 09/17/2021  ? ALT 42 09/17/2021  ? AST 36 09/17/2021  ? NA 136 09/17/2021  ? K 4.4 09/17/2021  ? CL 99 09/17/2021  ? CREATININE 0.90 09/17/2021   ? BUN 14 09/17/2021  ? CO2 25 09/17/2021  ? TSH 2.610 06/10/2021  ? HGBA1C 5.4 09/17/2021  ? MICROALBUR 30 07/29/2020  ? ? ? ? ?Assessment & Plan:  ? ?  1. Hypertensive heart disease without heart failure-well controlled ?-continue Micardis and Coreg ? ?2. Hypertension associated with type 2 diabetes mellitus (HCC)-well controlled ?-continue heart healthy diet and physical activity ?  ? ?Continue medications ?Follow-up at 12/09/21 at 8:00 am ? ?Follow-up: 12/09/21 at 8:00 ? ?An After Visit Summary was printed and given to the patient. ? ?I, Rip Harbour, NP, have reviewed all documentation for this visit. The documentation on 10/10/21 for the exam, diagnosis, procedures, and orders are all accurate and complete.  ? ? ?Signed, ?Rip Harbour, NP ?[place] ?[phone] ?

## 2021-11-17 ENCOUNTER — Other Ambulatory Visit: Payer: Self-pay

## 2021-11-17 DIAGNOSIS — I252 Old myocardial infarction: Secondary | ICD-10-CM

## 2021-11-17 DIAGNOSIS — E1159 Type 2 diabetes mellitus with other circulatory complications: Secondary | ICD-10-CM

## 2021-11-17 DIAGNOSIS — I119 Hypertensive heart disease without heart failure: Secondary | ICD-10-CM

## 2021-11-17 MED ORDER — TELMISARTAN 80 MG PO TABS: 80.0000 mg | ORAL_TABLET | Freq: Every day | ORAL | 0 refills | Status: DC

## 2021-12-09 ENCOUNTER — Encounter: Payer: Self-pay | Admitting: Family Medicine

## 2021-12-09 ENCOUNTER — Ambulatory Visit: Payer: Managed Care, Other (non HMO) | Admitting: Family Medicine

## 2021-12-09 VITALS — BP 130/70 | HR 68 | Temp 97.4°F | Resp 16 | Wt 252.0 lb

## 2021-12-09 DIAGNOSIS — I119 Hypertensive heart disease without heart failure: Secondary | ICD-10-CM

## 2021-12-09 DIAGNOSIS — B070 Plantar wart: Secondary | ICD-10-CM

## 2021-12-09 DIAGNOSIS — E1121 Type 2 diabetes mellitus with diabetic nephropathy: Secondary | ICD-10-CM | POA: Diagnosis not present

## 2021-12-09 DIAGNOSIS — E782 Mixed hyperlipidemia: Secondary | ICD-10-CM | POA: Diagnosis not present

## 2021-12-09 DIAGNOSIS — F330 Major depressive disorder, recurrent, mild: Secondary | ICD-10-CM

## 2021-12-09 DIAGNOSIS — I25118 Atherosclerotic heart disease of native coronary artery with other forms of angina pectoris: Secondary | ICD-10-CM | POA: Diagnosis not present

## 2021-12-09 DIAGNOSIS — E038 Other specified hypothyroidism: Secondary | ICD-10-CM

## 2021-12-09 DIAGNOSIS — Z125 Encounter for screening for malignant neoplasm of prostate: Secondary | ICD-10-CM

## 2021-12-09 DIAGNOSIS — Z6837 Body mass index (BMI) 37.0-37.9, adult: Secondary | ICD-10-CM

## 2021-12-09 NOTE — Assessment & Plan Note (Signed)
Well controlled. No changes to medicines.  Continue to work on eating a healthy diet and exercise.    

## 2021-12-09 NOTE — Assessment & Plan Note (Signed)
Previously well controlled Continue Synthroid at current dose  Recheck TSH and adjust Synthroid as indicated   

## 2021-12-09 NOTE — Assessment & Plan Note (Signed)
Recommend OTC Compound W

## 2021-12-09 NOTE — Assessment & Plan Note (Signed)
Well controlled.  No changes to medicines.  Continue to work on eating a healthy diet and exercise.  Labs to be drawn after 7/24.

## 2021-12-09 NOTE — Assessment & Plan Note (Signed)
Check PSA level 

## 2021-12-09 NOTE — Assessment & Plan Note (Signed)
Control: great Recommend check feet daily. Recommend annual eye exams. Medicines: none Continue to work on eating a healthy diet and exercise.  Marland Kitchen

## 2021-12-09 NOTE — Assessment & Plan Note (Signed)
The current medical regimen is effective;  continue present plan and medications. Lamictal.

## 2021-12-09 NOTE — Assessment & Plan Note (Addendum)
Recommend continue to work on eating healthy diet and exercise. Continue golo. Comorbidities include diabetes, hypertension and hyperlipidemia.

## 2021-12-09 NOTE — Progress Notes (Signed)
Subjective:  Patient ID: Gerald Jordan, male    DOB: [birth year]  Age: 61 y.o. MRN: [id number]  Chief Complaint  Patient presents with   Hyperlipidemia   Night Sweats   Diabetes   Hypothyroidism    HPI Diabetes:  Complications: Glucose checking: one in a while. 90-110s Glucose logs: Hypoglycemia:no Most recent A1C:5.4 Current medications: none Last Eye Exam:03/10/2021 Foot checks: daily  Hyperlipidemia: Current medications: lipitor 80 mg once daily  Hypertension: Complications: Current medications:telmisartan 80 mg once daily, coreg 6.25 mg one twice daily.   Diet: healthy. golo Exercise: very active  Hypothyroidism: on synthroid 112 mcg once daily in am.   Bipolar: lamictal 150 mg twice daily.   Current Outpatient Medications on File Prior to Visit  Medication Sig Dispense Refill   aspirin 81 MG tablet Take 81 mg by mouth daily.     atorvastatin (LIPITOR) 80 MG tablet Take 1 tablet (80 mg total) by mouth daily. 90 tablet 3   carvedilol (COREG) 6.25 MG tablet Take 1 tablet (6.25 mg total) by mouth 2 (two) times daily. 14 tablet 0   citalopram (CELEXA) 20 MG tablet Take 1 tablet (20 mg total) by mouth daily. 90 tablet 3   glucose blood test strip 1 each by Other route daily. Use as instructed 100 each 3   lamoTRIgine (LAMICTAL) 150 MG tablet Take 1 tablet (150 mg total) by mouth 2 (two) times daily. Take two tablets once a day. 180 tablet 3   latanoprost (XALATAN) 0.005 % ophthalmic solution Place 1 drop into both eyes nightly. 2.5 mL 2   levothyroxine (SYNTHROID) 112 MCG tablet Take 1 tablet (112 mcg total) by mouth daily. 90 tablet 3   nitroGLYCERIN (NITROSTAT) 0.4 MG SL tablet Place 1 tablet (0.4 mg total) under the tongue every 5 (five) minutes as needed for chest pain. 25 tablet 0   telmisartan (MICARDIS) 80 MG tablet Take 1 tablet (80 mg total) by mouth daily. 90 tablet 0   No current facility-administered medications on file prior to visit.   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   Type 2 diabetes mellitus (HCC)    Unspecified hearing loss, bilateral    Ventricular fibrillation [facility])    Past Surgical History:  Procedure Laterality Date   arm surgery  05/2014   CHOLECYSTECTOMY  02/2016   HERNIA REPAIR     1999   NASAL SINUS SURGERY  2000   tendon repair 11/2014      Family History  Problem Relation Age of Onset   Breast cancer Mother    Melanoma Father    Diabetes Sister    Arrhythmia Brother    Congestive Heart Failure Brother    Heart attack Brother    Atrial fibrillation Brother    Social History   Socioeconomic History   Marital status: Married    Spouse name: Not on file   Number of children: 1   Years of education: Not on file   Highest education level: Not on file  Occupational History   Occupation: Freight forwarder  Tobacco Use   Smoking status: Never   Smokeless tobacco: Never  Vaping Use   Vaping Use: Never used  Substance and Sexual Activity   Alcohol use: Not Currently   Drug use: No   Sexual activity: Yes    Partners: Female  Other Topics Concern   Not on file  Social History Narrative   Not on file   Social Determinants of Health   Financial Resource Strain: Not on file  Food Insecurity: Not on file  Transportation Needs: Not on file  Physical Activity: Not on file  Stress: Not on file  Social Connections: Not on file    Review of Systems  Constitutional:  Positive for diaphoresis (occasionally in the middle of the night in the last 1-2 weeks.). Negative for chills and fever.   HENT:  Negative for congestion, rhinorrhea and sore throat.   Respiratory:  Negative for cough and shortness of breath.   Cardiovascular:  Negative for chest pain and palpitations.  Gastrointestinal:  Negative for abdominal pain, constipation, diarrhea, nausea and vomiting.  Genitourinary:  Negative for dysuria and urgency.  Musculoskeletal:  Positive for neck pain (left arm pain). Negative for back pain and myalgias. Arthralgias: left shoulder pain. Neurological:  Negative for dizziness and headaches.  Psychiatric/Behavioral:  Negative for dysphoric mood. The patient is not nervous/anxious.      Objective:  BP 130/70   Pulse 68   Temp (!) 97.4 F (36.3 C)   Resp 16   Wt 252 lb (114.3 kg)   BMI 37.21 kg/m      12/09/2021    8:01 AM 10/08/2021    8:08 AM 09/17/2021    8:25 AM  BP/Weight  Systolic BP 950 932 96  Diastolic BP 70 80 58  Wt. (Lbs) 252 253 251  BMI 37.21 kg/m2 37.36 kg/m2 37.07 kg/m2    Physical Exam Vitals reviewed.  Constitutional:      Appearance: Normal appearance.  Neck:     Vascular: No carotid bruit.  Cardiovascular:     Rate and Rhythm: Normal rate and regular rhythm.     Pulses: Normal pulses.     Heart sounds: Normal heart sounds.  Pulmonary:     Effort: Pulmonary effort is normal.     Breath sounds: Normal breath sounds. No wheezing, rhonchi or rales.  Abdominal:     General: Bowel sounds are normal.     Palpations: Abdomen is soft.     Tenderness: There is no abdominal tenderness.  Neurological:     Mental Status: He is alert.  Psychiatric:        Mood and Affect: Mood normal.        Behavior: Behavior normal.     Diabetic Foot Exam - Simple   Simple Foot Form Diabetic Foot exam was performed with the following findings: Yes 12/09/2021  8:44 AM  Visual Inspection See comments: Yes Sensation Testing Intact to touch and monofilament testing bilaterally: Yes Pulse Check Posterior Tibialis and Dorsalis pulse intact bilaterally:  Yes Comments Plantar wart on left foot. Small. Nontender.       Lab Results  Component Value Date   WBC 6.1 09/17/2021   HGB 14.7 09/17/2021   HCT 41.2 09/17/2021   PLT 170 09/17/2021   GLUCOSE 109 (H) 09/17/2021   CHOL 87 (L) 09/17/2021   TRIG 57 09/17/2021   HDL 35 (L) 09/17/2021   LDLCALC 38 09/17/2021   ALT 42 09/17/2021   AST 36 09/17/2021   NA 136 09/17/2021  K 4.4 09/17/2021   CL 99 09/17/2021   CREATININE 0.90 09/17/2021   BUN 14 09/17/2021   CO2 25 09/17/2021   TSH 2.610 06/10/2021   HGBA1C 5.4 09/17/2021   MICROALBUR 30 07/29/2020      Assessment & Plan:   Problem List Items Addressed This Visit       Cardiovascular and Mediastinum   Coronary artery disease of native artery of native heart with stable angina pectoris ([place])    Well controlled.  No changes to medicines.  Continue to work on eating a healthy diet and exercise.         Hypertensive heart disease without heart failure    Well controlled.  No changes to medicines.  Continue to work on eating a healthy diet and exercise.  Labs to be drawn after 7/24.       Relevant Orders   CBC with Differential/Platelet   Comprehensive metabolic panel     Endocrine   Diabetic glomerulopathy ([place])    Control: great Recommend check feet daily. Recommend annual eye exams. Medicines: none Continue to work on eating a healthy diet and exercise.  .         Relevant Orders   Hemoglobin A1c   Secondary hypothyroidism    Previously well controlled Continue Synthroid at current dose  Recheck TSH and adjust Synthroid as indicated          Musculoskeletal and Integument   Plantar wart of left foot    Recommend OTC Compound W        Other   Class 2 severe obesity due to excess calories with serious comorbidity and body mass index (BMI) of 37.0 to 37.9 in adult [facility])    Recommend continue to work on eating healthy diet and exercise. Continue golo. Comorbidities include diabetes,  hypertension and hyperlipidemia.      Mild recurrent major depression (HCC)    The current medical regimen is effective;  continue present plan and medications. Lamictal.        Prostate cancer screening    Check PSA level      Relevant Orders   PSA   Mixed hyperlipidemia - Primary   Relevant Orders   Lipid panel  .  No orders of the defined types were placed in this encounter.   Orders Placed This Encounter  Procedures   CBC with Differential/Platelet   Comprehensive metabolic panel   Lipid panel   Hemoglobin A1c   PSA     Follow-up: Return in about 11 days (around 12/20/2021) for lab visit fasting. 6 month follow up fasting.  An After Visit Summary was printed and given to the patient.  Rochel Brome, MD [facility] [phone]

## 2021-12-13 ENCOUNTER — Other Ambulatory Visit: Payer: Managed Care, Other (non HMO)

## 2021-12-20 ENCOUNTER — Other Ambulatory Visit: Payer: Managed Care, Other (non HMO)

## 2021-12-20 DIAGNOSIS — E782 Mixed hyperlipidemia: Secondary | ICD-10-CM

## 2021-12-20 DIAGNOSIS — I119 Hypertensive heart disease without heart failure: Secondary | ICD-10-CM

## 2021-12-20 DIAGNOSIS — E1121 Type 2 diabetes mellitus with diabetic nephropathy: Secondary | ICD-10-CM

## 2021-12-20 DIAGNOSIS — Z125 Encounter for screening for malignant neoplasm of prostate: Secondary | ICD-10-CM

## 2021-12-21 LAB — COMPREHENSIVE METABOLIC PANEL
ALT: 37 IU/L (ref 0–44)
AST: 28 IU/L (ref 0–40)
Albumin/Globulin Ratio: 2.7 — ABNORMAL HIGH (ref 1.2–2.2)
Albumin: 4.3 g/dL (ref 3.9–4.9)
Alkaline Phosphatase: 105 IU/L (ref 44–121)
BUN/Creatinine Ratio: 11 (ref 10–24)
BUN: 9 mg/dL (ref 8–27)
Bilirubin Total: 0.5 mg/dL (ref 0.0–1.2)
CO2: 26 mmol/L (ref 20–29)
Calcium: 8.6 mg/dL (ref 8.6–10.2)
Chloride: 100 mmol/L (ref 96–106)
Creatinine, Ser: 0.81 mg/dL (ref 0.76–1.27)
Globulin, Total: 1.6 g/dL (ref 1.5–4.5)
Glucose: 115 mg/dL — ABNORMAL HIGH (ref 70–99)
Potassium: 4.1 mmol/L (ref 3.5–5.2)
Sodium: 140 mmol/L (ref 134–144)
Total Protein: 5.9 g/dL — ABNORMAL LOW (ref 6.0–8.5)
eGFR: 100 mL/min/{1.73_m2} (ref >59)

## 2021-12-21 LAB — CBC WITH DIFFERENTIAL/PLATELET
Basophils Absolute: 0 10*3/uL (ref 0.0–0.2)
Basos: 0 %
EOS (ABSOLUTE): 0.1 10*3/uL (ref 0.0–0.4)
Eos: 2 %
Hematocrit: 40.5 % (ref 37.5–51.0)
Hemoglobin: 14.2 g/dL (ref 13.0–17.7)
Immature Grans (Abs): 0 10*3/uL (ref 0.0–0.1)
Immature Granulocytes: 0 %
Lymphocytes Absolute: 1.6 10*3/uL (ref 0.7–3.1)
Lymphs: 29 %
MCH: 31.4 pg (ref 26.6–33.0)
MCHC: 35.1 g/dL (ref 31.5–35.7)
MCV: 90 fL (ref 79–97)
Monocytes Absolute: 0.5 10*3/uL (ref 0.1–0.9)
Monocytes: 10 %
Neutrophils Absolute: 3.2 10*3/uL (ref 1.4–7.0)
Neutrophils: 59 %
Platelets: 139 10*3/uL — ABNORMAL LOW (ref 150–450)
RBC: 4.52 x10E6/uL (ref 4.14–5.80)
RDW: 12.3 % (ref 11.6–15.4)
WBC: 5.4 10*3/uL (ref 3.4–10.8)

## 2021-12-21 LAB — LIPID PANEL
Chol/HDL Ratio: 2.4 ratio (ref 0.0–5.0)
Cholesterol, Total: 91 mg/dL — ABNORMAL LOW (ref 100–199)
HDL: 38 mg/dL — ABNORMAL LOW (ref >39)
LDL Chol Calc (NIH): 39 mg/dL (ref 0–99)
Triglycerides: 60 mg/dL (ref 0–149)
VLDL Cholesterol Cal: 14 mg/dL (ref 5–40)

## 2021-12-21 LAB — HEMOGLOBIN A1C
Est. average glucose Bld gHb Est-mCnc: 111 mg/dL
Hgb A1c MFr Bld: 5.5 % (ref 4.8–5.6)

## 2021-12-21 LAB — CARDIOVASCULAR RISK ASSESSMENT

## 2021-12-21 LAB — PSA: Prostate Specific Ag, Serum: 0.7 ng/mL (ref 0.0–4.0)

## 2021-12-21 NOTE — Progress Notes (Signed)
Blood count abnormal. Platelets little low. Repeat in 2 weeks. Liver function normal.  Kidney function normal.  Cholesterol: hdl low. LDL good. Trigs good. Recommend increase exercise to 30-45 minutes 5 times per week.  HBA1C: 5.5. Great! PSA normal

## 2021-12-23 ENCOUNTER — Encounter: Payer: Self-pay | Admitting: Nurse Practitioner

## 2021-12-23 ENCOUNTER — Ambulatory Visit (INDEPENDENT_AMBULATORY_CARE_PROVIDER_SITE_OTHER): Payer: Managed Care, Other (non HMO) | Admitting: Nurse Practitioner

## 2021-12-23 VITALS — BP 124/74 | HR 54 | Temp 97.8°F | Ht 69.0 in | Wt 257.0 lb

## 2021-12-23 DIAGNOSIS — L237 Allergic contact dermatitis due to plants, except food: Secondary | ICD-10-CM

## 2021-12-23 MED ORDER — TRIAMCINOLONE ACETONIDE 40 MG/ML IJ SUSP
60.0000 mg | Freq: Once | INTRAMUSCULAR | Status: AC
  Administered 2021-12-23: 60 mg via INTRAMUSCULAR

## 2021-12-23 MED ORDER — TRIAMCINOLONE ACETONIDE 0.1 % EX CREA: 1.0000 | TOPICAL_CREAM | Freq: Two times a day (BID) | CUTANEOUS | 1 refills | Status: DC

## 2021-12-23 NOTE — Progress Notes (Signed)
Acute Office Visit  Subjective:    Patient ID: Gerald Jordan, male    DOB: 1960/09/29, 61 y.o.   MRN: [id number]  Chief Complaint  Patient presents with  . Poison ivy    HPI Patient is in today for poison ivy rash to bilateral forearms. Onset was yesterday. Treatment has included rubbing alcohol. Reports that his dog entered into a wooded area yesterday that is known to have a lot of poison ivy plants. Denies fever, chills, recent known tick bite, changing laundry detergent, soaps, or lotions.  Past Medical History:  Diagnosis Date  . Atherosclerotic heart disease of native coronary artery without angina pectoris 2019   Ventricular fibrillation - cardioverted x 3 in ambulance. PTCA LAD 100%  . Bradycardia   . Hyperlipidemia   . Hypertension   . Hypertensive heart disease without heart failure   . Morbid obesity ([place]) 08/05/2019  . Myocardial infarction involving left main coronary artery ([place])   . Obstructive sleep apnea   . 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 Type 2 diabetes mellitus ([place])   . Unspecified hearing loss, bilateral   . Ventricular fibrillation [facility])     Past Surgical History:  Procedure Laterality Date  . arm surgery  05/2014  . CHOLECYSTECTOMY  02/2016  . HERNIA REPAIR     1999  . NASAL SINUS SURGERY  2000  . tendon repair 11/2014      Family History  Problem Relation Age of Onset  . Breast cancer Mother   . Melanoma Father   . Diabetes Sister   . Arrhythmia Brother   . Congestive Heart Failure Brother   . Heart attack Brother   . Atrial fibrillation Brother     Social History   Socioeconomic History  . Marital status: Married    Spouse name: Not on file  . Number of children: 1  . Years of education: Not on file  . Highest education level: Not on file  Occupational History  . Occupation: Freight forwarder  Tobacco Use  . Smoking status: Never  . Smokeless tobacco: Never  Vaping Use  . Vaping Use: Never used  Substance and Sexual  Activity  . Alcohol use: Not Currently  . Drug use: No  . Sexual activity: Yes    Partners: Female  Other Topics Concern  . Not on file  Social History Narrative  . Not on file   Social Determinants of Health   Financial Resource Strain: Not on file  Food Insecurity: Not on file  Transportation Needs: Not on file  Physical Activity: Not on file  Stress: Not on file  Social Connections: Not on file  Intimate Partner Violence: Not on file    Outpatient Medications Prior to Visit  Medication Sig Dispense Refill  . aspirin 81 MG tablet Take 81 mg by mouth daily.    Marland Kitchen atorvastatin (LIPITOR) 80 MG tablet Take 1 tablet (80 mg total) by mouth daily. 90 tablet 3  . carvedilol (COREG) 6.25 MG tablet Take 1 tablet (6.25 mg total) by mouth 2 (two) times daily. 14 tablet 0  . citalopram (CELEXA) 20 MG tablet Take 1 tablet (20 mg total) by mouth daily. 90 tablet 3  . gabapentin (NEURONTIN) 300 MG capsule Take 300 mg by mouth 2 (two) times daily.    Marland Kitchen glucose blood test strip 1 each by Other route daily. Use as instructed 100 each 3  . lamoTRIgine (LAMICTAL) 150 MG tablet Take 1 tablet (150 mg total) by mouth 2 (  two) times daily. Take two tablets once a day. 180 tablet 3  . latanoprost (XALATAN) 0.005 % ophthalmic solution Place 1 drop into both eyes nightly. 2.5 mL 2  . levothyroxine (SYNTHROID) 112 MCG tablet Take 1 tablet (112 mcg total) by mouth daily. 90 tablet 3  . nitroGLYCERIN (NITROSTAT) 0.4 MG SL tablet Place 1 tablet (0.4 mg total) under the tongue every 5 (five) minutes as needed for chest pain. 25 tablet 0  . telmisartan (MICARDIS) 80 MG tablet Take 1 tablet (80 mg total) by mouth daily. 90 tablet 0   No facility-administered medications prior to visit.    Allergies  Allergen Reactions  . Penicillins Nausea And Vomiting    Ulcers - tolerated Augmentin 09/2012 Mouth Ulcers - tolerated Augmentin 09/2012 Ulcers - tolerated Augmentin 09/2012   . Lisinopril Other (See Comments)     Coughing Coughing     Review of Systems  Constitutional:  Negative for appetite change, fatigue and fever.  HENT:  Negative for congestion, ear pain, sinus pressure and sore throat.   Respiratory:  Negative for cough, chest tightness, shortness of breath and wheezing.   Cardiovascular:  Negative for chest pain and palpitations.  Gastrointestinal:  Negative for abdominal pain, constipation, diarrhea, nausea and vomiting.  Genitourinary:  Negative for dysuria and hematuria.  Musculoskeletal:  Negative for arthralgias, back pain, joint swelling and myalgias.  Skin:  Positive for rash (bilateral forearms).  Neurological:  Negative for dizziness, weakness and headaches.  Psychiatric/Behavioral:  Negative for dysphoric mood. The patient is not nervous/anxious.        Objective:    Physical Exam Vitals reviewed.  Constitutional:      Appearance: Normal appearance.  Skin:    Capillary Refill: Capillary refill takes less than 2 seconds.     Findings: Rash present. Rash is vesicular.       Neurological:     General: No focal deficit present.     Mental Status: He is alert and oriented to person, place, and time.  Psychiatric:        Mood and Affect: Mood normal.        Behavior: Behavior normal.    BP 124/74 (BP Location: Left Arm, Patient Position: Sitting)   Pulse (!) 54   Temp 97.8 F (36.6 C) (Oral)   Ht $R'5\' 9"'Db$  (1.753 m)   Wt 257 lb (116.6 kg)   SpO2 96%   BMI 37.95 kg/m   Wt Readings from Last 3 Encounters:  12/09/21 252 lb (114.3 kg)  10/08/21 253 lb (114.8 kg)  09/17/21 251 lb (113.9 kg)   Lab Results  Component Value Date   TSH 2.610 06/10/2021   Lab Results  Component Value Date   WBC 5.4 12/20/2021   HGB 14.2 12/20/2021   HCT 40.5 12/20/2021   MCV 90 12/20/2021   PLT 139 (L) 12/20/2021   Lab Results  Component Value Date   NA 140 12/20/2021   K 4.1 12/20/2021   CO2 26 12/20/2021   GLUCOSE 115 (H) 12/20/2021   BUN 9 12/20/2021   CREATININE 0.81  12/20/2021   BILITOT 0.5 12/20/2021   ALKPHOS 105 12/20/2021   AST 28 12/20/2021   ALT 37 12/20/2021   PROT 5.9 (L) 12/20/2021   ALBUMIN 4.3 12/20/2021   CALCIUM 8.6 12/20/2021   EGFR 100 12/20/2021   Lab Results  Component Value Date   CHOL 91 (L) 12/20/2021   Lab Results  Component Value Date   HDL 38 (L) 12/20/2021  Lab Results  Component Value Date   LDLCALC 39 12/20/2021   Lab Results  Component Value Date   TRIG 60 12/20/2021   Lab Results  Component Value Date   CHOLHDL 2.4 12/20/2021   Lab Results  Component Value Date   HGBA1C 5.5 12/20/2021         Assessment & Plan:   1. Poison ivy dermatitis - triamcinolone acetonide (KENALOG-40) injection 60 mg - triamcinolone cream (KENALOG) 0.1 %; Apply 1 Application topically 2 (two) times daily.  Dispense: 30 g; Refill: 1    Apply Triamcinolone cream to rash twice daily Kenalog injection given in office Follow-up as needed   I,Lauren M Auman,acting as a scribe for [employer], NP.,have documented all relevant documentation on the behalf of Rip Harbour, NP,as directed by  Rip Harbour, NP while in the presence of Rip Harbour, NP.   Oleta Mouse, CMA  I, Rip Harbour, NP, have reviewed all documentation for this visit. The documentation on 12/23/21 for the exam, diagnosis, procedures, and orders are all accurate and complete.    SignedJerrell Belfast, DNP 12/23/21 at 2:34 pm

## 2021-12-23 NOTE — Patient Instructions (Addendum)
Apply Triamcinolone cream to rash twice daily Kenalog injection given in office Follow-up as needed    Poison Ivy Dermatitis Poison ivy dermatitis is redness and soreness of the skin caused by chemicals in the leaves of the poison ivy plant. You may have very bad itching, swelling, a rash, and blisters. What are the causes? Touching a poison ivy plant. Touching something that has the chemical on it. This may include animals or objects that have come in contact with the plant. What increases the risk? Going outdoors often in wooded or [place] areas. Going outdoors without wearing protective clothing, such as closed shoes, long pants, and a long-sleeved shirt. What are the signs or symptoms?  Skin redness. Very bad itching. A rash that often includes bumps and blisters. The rash usually appears 48 hours after exposure, if you have been exposed before. If this is the first time you have been exposed, the rash may not appear until a week after exposure. Swelling. This may occur if the reaction is very bad. Symptoms usually last for 1-2 weeks. The first time you develop this condition, symptoms may last 3-4 weeks. How is this treated? This condition may be treated with: Hydrocortisone cream or calamine lotion to relieve itching. Oatmeal baths to soothe the skin. Medicines, such as over-the-counter antihistamine tablets. Oral steroid medicine for more severe reactions. Follow these instructions at home: Medicines Take or apply over-the-counter and prescription medicines only as told by your doctor. Use hydrocortisone cream or calamine lotion as needed to help with itching. General instructions Do not scratch or rub your skin. Put a cold, wet cloth (cold compress) on the affected areas or take baths in cool water. This will help with itching. Avoid hot baths and showers. Take oatmeal baths as needed. Use colloidal oatmeal. You can get this at a pharmacy or grocery store. Follow the  instructions on the package. While you have the rash, wash your clothes right after you wear them. Keep all follow-up visits as told by your health care provider. This is important. How is this prevented?  Know what poison ivy looks like, so you can avoid it. This plant has three leaves with flowering branches on a single stem. The leaves are glossy. The leaves have uneven edges that come to a point at the front. If you touch poison ivy, wash your skin with soap and water right away. Be sure to wash under your fingernails. When hiking or camping, wear long pants, a long-sleeved shirt, tall socks, and hiking boots. You can also use a lotion on your skin that helps to prevent contact with poison ivy. If you think that your clothes or outdoor gear came in contact with poison ivy, rinse them off with a garden hose before you bring them inside your house. When doing yard work or gardening, wear gloves, long sleeves, long pants, and boots. Wash your garden tools and gloves if they come in contact with poison ivy. If you think that your pet has come into contact with poison ivy, wash him or her with pet shampoo and water. Make sure to wear gloves while washing your pet. Contact a doctor if: You have open sores in the rash area. You have more redness, swelling, or pain in the rash area. You have redness that spreads beyond the rash area. You have fluid, blood, or pus coming from the rash area. You have a fever. You have a rash over a large area of your body. You have a rash on your  eyes, mouth, or genitals. Your rash does not get better after a few weeks. Get help right away if: Your face swells or your eyes swell shut. You have trouble breathing. You have trouble swallowing. These symptoms may be an emergency. Do not wait to see if the symptoms will go away. Get medical help right away. Call your local emergency services (911 in the [place] Do not drive yourself to the hospital. Summary Poison  ivy dermatitis is redness and soreness of the skin caused by chemicals in the leaves of the poison ivy plant. You may have skin redness, very bad itching, swelling, and a rash. Do not scratch or rub your skin. Take or apply over-the-counter and prescription medicines only as told by your doctor. This information is not intended to replace advice given to you by your health care provider. Make sure you discuss any questions you have with your health care provider. Document Revised: 03/01/2021 Document Reviewed: 03/01/2021 Elsevier Patient Education  [place].

## 2021-12-24 ENCOUNTER — Other Ambulatory Visit: Payer: Self-pay

## 2021-12-24 DIAGNOSIS — R7989 Other specified abnormal findings of blood chemistry: Secondary | ICD-10-CM

## 2022-01-07 ENCOUNTER — Other Ambulatory Visit: Payer: Managed Care, Other (non HMO)

## 2022-01-07 DIAGNOSIS — R7989 Other specified abnormal findings of blood chemistry: Secondary | ICD-10-CM

## 2022-01-07 LAB — CBC WITH DIFFERENTIAL/PLATELET
Basophils Absolute: 0 10*3/uL (ref 0.0–0.2)
Basos: 0 %
EOS (ABSOLUTE): 0.1 10*3/uL (ref 0.0–0.4)
Eos: 1 %
Hematocrit: 44.1 % (ref 37.5–51.0)
Hemoglobin: 15.2 g/dL (ref 13.0–17.7)
Immature Grans (Abs): 0 10*3/uL (ref 0.0–0.1)
Immature Granulocytes: 1 %
Lymphocytes Absolute: 2.2 10*3/uL (ref 0.7–3.1)
Lymphs: 33 %
MCH: 31.6 pg (ref 26.6–33.0)
MCHC: 34.5 g/dL (ref 31.5–35.7)
MCV: 92 fL (ref 79–97)
Monocytes Absolute: 0.7 10*3/uL (ref 0.1–0.9)
Monocytes: 10 %
Neutrophils Absolute: 3.6 10*3/uL (ref 1.4–7.0)
Neutrophils: 55 %
Platelets: 151 10*3/uL (ref 150–450)
RBC: 4.81 x10E6/uL (ref 4.14–5.80)
RDW: 12.6 % (ref 11.6–15.4)
WBC: 6.6 10*3/uL (ref 3.4–10.8)

## 2022-02-02 ENCOUNTER — Ambulatory Visit (HOSPITAL_COMMUNITY): Payer: Self-pay | Admitting: Orthopedic Surgery

## 2022-02-10 NOTE — Progress Notes (Signed)
Surgical Instructions    Your procedure is scheduled on Thursday, 02/17/22.  Report to [facility] Main Entrance "A" at 9:00 A.M., then check in with the Admitting office.  Call this number if you have problems the morning of surgery:  [phone]   If you have any questions prior to your surgery date call [phone]: Open Monday-Friday 8am-4pm    Remember:  Do not eat or drink after midnight the night before your surgery     Take these medicines the morning of surgery with A SIP OF WATER:  carvedilol (COREG)  citalopram (CELEXA) lamoTRIgine (LAMICTAL)  levothyroxine (SYNTHROID)  nitroGLYCERIN (NITROSTAT) if needed  As of today, STOP taking any Aspirin (unless otherwise instructed by your surgeon) Aleve, Naproxen, Ibuprofen, Motrin, Advil, Goody's, BC's, all herbal medications, fish oil, and all vitamins.   HOW TO MANAGE YOUR DIABETES BEFORE AND AFTER SURGERY  Why is it important to control my blood sugar before and after surgery? Improving blood sugar levels before and after surgery helps healing and can limit problems. A way of improving blood sugar control is eating a healthy diet by:  Eating less sugar and carbohydrates  Increasing activity/exercise  Talking with your doctor about reaching your blood sugar goals High blood sugars (greater than 180 mg/dL) can raise your risk of infections and slow your recovery, so you will need to focus on controlling your diabetes during the weeks before surgery. Make sure that the doctor who takes care of your diabetes knows about your planned surgery including the date and location.  How do I manage my blood sugar before surgery? Check your blood sugar at least 4 times a day, starting 2 days before surgery, to make sure that the level is not too high or low.  Check your blood sugar the morning of your surgery when you wake up and every 2 hours until you get to the Short Stay unit.  If your blood sugar is less than 70 mg/dL, you will  need to treat for low blood sugar: Do not take insulin. Treat a low blood sugar (less than 70 mg/dL) with  cup of clear juice (cranberry or apple), 4 glucose tablets, OR glucose gel. Recheck blood sugar in 15 minutes after treatment (to make sure it is greater than 70 mg/dL). If your blood sugar is not greater than 70 mg/dL on recheck, call [phone] for further instructions. Report your blood sugar to the short stay nurse when you get to Short Stay.  If you are admitted to the hospital after surgery: Your blood sugar will be checked by the staff and you will probably be given insulin after surgery (instead of oral diabetes medicines) to make sure you have good blood sugar levels. The goal for blood sugar control after surgery is 80-180 mg/dL.           Do not wear jewelry or makeup. Do not wear lotions, powders, cologne or deodorant. Men may shave face and neck. Do not bring valuables to the hospital. Do not wear nail polish, gel polish, artificial nails, or any other type of covering on natural nails (fingers and toes) If you have artificial nails or gel coating that need to be removed by a nail salon, please have this removed prior to surgery. Artificial nails or gel coating may interfere with anesthesia's ability to adequately monitor your vital signs.  [facility] is not responsible for any belongings or valuables.    Do NOT Smoke (Tobacco/Vaping)  24 hours prior to your procedure  If you use a CPAP at night, you may bring your mask for your overnight stay.   Contacts, glasses, hearing aids, dentures or partials may not be worn into surgery, please bring cases for these belongings   For patients admitted to the hospital, discharge time will be determined by your treatment team.   Patients discharged the day of surgery will not be allowed to drive home, and someone needs to stay with them for 24 hours.   SURGICAL WAITING ROOM VISITATION Patients having surgery or a procedure  may have no more than 2 support people in the waiting area - these visitors may rotate.   Children under the age of 9 must have an adult with them who is not the patient. If the patient needs to stay at the hospital during part of their recovery, the visitor guidelines for inpatient rooms apply. Pre-op nurse will coordinate an appropriate time for 1 support person to accompany patient in pre-op.  This support person may not rotate.   Please refer to the [facility] website for the visitor guidelines for Inpatients (after your surgery is over and you are in a regular room).    Special instructions:    Oral Hygiene is also important to reduce your risk of infection.  Remember - BRUSH YOUR TEETH THE MORNING OF SURGERY WITH YOUR REGULAR TOOTHPASTE   [place]- Preparing For Surgery  Before surgery, you can play an important role. Because skin is not sterile, your skin needs to be as free of germs as possible. You can reduce the number of germs on your skin by washing with CHG (chlorahexidine gluconate) Soap before surgery.  CHG is an antiseptic cleaner which kills germs and bonds with the skin to continue killing germs even after washing.     Please do not use if you have an allergy to CHG or antibacterial soaps. If your skin becomes reddened/irritated stop using the CHG.  Do not shave (including legs and underarms) for at least 48 hours prior to first CHG shower. It is OK to shave your face.  Please follow these instructions carefully.     Shower the NIGHT BEFORE SURGERY and the MORNING OF SURGERY with CHG Soap.   If you chose to wash your hair, wash your hair first as usual with your normal shampoo. After you shampoo, rinse your hair and body thoroughly to remove the shampoo.  Then [employer] and genitals (private parts) with your normal soap and rinse thoroughly to remove soap.  After that Use CHG Soap as you would any other liquid soap. You can apply CHG directly to the skin and wash  gently with a scrungie or a clean washcloth.   Apply the CHG Soap to your body ONLY FROM THE NECK DOWN.  Do not use on open wounds or open sores. Avoid contact with your eyes, ears, mouth and genitals (private parts). Wash Face and genitals (private parts)  with your normal soap.   Wash thoroughly, paying special attention to the area where your surgery will be performed.  Thoroughly rinse your body with warm water from the neck down.  DO NOT shower/wash with your normal soap after using and rinsing off the CHG Soap.  Pat yourself dry with a CLEAN TOWEL.  Wear CLEAN PAJAMAS to bed the night before surgery  Place CLEAN SHEETS on your bed the night before your surgery  DO NOT SLEEP WITH PETS.   Day of Surgery: Take a shower with CHG soap. Wear Clean/Comfortable clothing  the morning of surgery Do not apply any deodorants/lotions.   Remember to brush your teeth WITH YOUR REGULAR TOOTHPASTE.    If you received a COVID test during your pre-op visit, it is requested that you wear a mask when out in public, stay away from anyone that may not be feeling well, and notify your surgeon if you develop symptoms. If you have been in contact with anyone that has tested positive in the last 10 days, please notify your surgeon.    Please read over the following fact sheets that you were given.

## 2022-02-11 ENCOUNTER — Encounter (HOSPITAL_COMMUNITY)
Admission: RE | Admit: 2022-02-11 | Discharge: 2022-02-11 | Disposition: A | Discharge status: Home / Self Care | Payer: No Typology Code available for payment source | Source: Ambulatory Visit | Attending: Orthopedic Surgery | Admitting: Orthopedic Surgery

## 2022-02-11 ENCOUNTER — Other Ambulatory Visit: Payer: Self-pay

## 2022-02-11 ENCOUNTER — Encounter (HOSPITAL_COMMUNITY): Payer: Self-pay

## 2022-02-11 VITALS — BP 107/66 | HR 54 | Temp 98.2°F | Resp 17 | Ht 69.0 in | Wt 261.4 lb

## 2022-02-11 DIAGNOSIS — Z01812 Encounter for preprocedural laboratory examination: Secondary | ICD-10-CM | POA: Insufficient documentation

## 2022-02-11 DIAGNOSIS — I2510 Atherosclerotic heart disease of native coronary artery without angina pectoris: Secondary | ICD-10-CM | POA: Diagnosis not present

## 2022-02-11 DIAGNOSIS — I252 Old myocardial infarction: Secondary | ICD-10-CM | POA: Insufficient documentation

## 2022-02-11 DIAGNOSIS — E118 Type 2 diabetes mellitus with unspecified complications: Secondary | ICD-10-CM | POA: Diagnosis not present

## 2022-02-11 DIAGNOSIS — G4733 Obstructive sleep apnea (adult) (pediatric): Secondary | ICD-10-CM | POA: Diagnosis not present

## 2022-02-11 DIAGNOSIS — K769 Liver disease, unspecified: Secondary | ICD-10-CM

## 2022-02-11 DIAGNOSIS — E119 Type 2 diabetes mellitus without complications: Secondary | ICD-10-CM

## 2022-02-11 DIAGNOSIS — Z01818 Encounter for other preprocedural examination: Secondary | ICD-10-CM

## 2022-02-11 HISTORY — DX: Inflammatory liver disease, unspecified: K75.9

## 2022-02-11 HISTORY — DX: Anxiety disorder, unspecified: F41.9

## 2022-02-11 HISTORY — DX: Fatty (change of) liver, not elsewhere classified: K76.0

## 2022-02-11 HISTORY — DX: Malignant (primary) neoplasm, unspecified: C80.1

## 2022-02-11 HISTORY — DX: Personal history of urinary calculi: Z87.442

## 2022-02-11 HISTORY — DX: Other complications of anesthesia, initial encounter: T88.59XA

## 2022-02-11 LAB — COMPREHENSIVE METABOLIC PANEL
ALT: 35 U/L (ref 0–44)
AST: 29 U/L (ref 15–41)
Albumin: 3.8 g/dL (ref 3.5–5.0)
Alkaline Phosphatase: 103 U/L (ref 38–126)
Anion gap: 7 (ref 5–15)
BUN: 9 mg/dL (ref 8–23)
CO2: 30 mmol/L (ref 22–32)
Calcium: 9 mg/dL (ref 8.9–10.3)
Chloride: 103 mmol/L (ref 98–111)
Creatinine, Ser: 0.8 mg/dL (ref 0.61–1.24)
GFR, Estimated: >60 mL/min (ref >60)
Glucose, Bld: 108 mg/dL — ABNORMAL HIGH (ref 70–99)
Potassium: 4.9 mmol/L (ref 3.5–5.1)
Sodium: 140 mmol/L (ref 135–145)
Total Bilirubin: 0.8 mg/dL (ref 0.3–1.2)
Total Protein: 6.3 g/dL — ABNORMAL LOW (ref 6.5–8.1)

## 2022-02-11 LAB — CBC
HCT: 42.1 % (ref 39.0–52.0)
Hemoglobin: 14.6 g/dL (ref 13.0–17.0)
MCH: 31.7 pg (ref 26.0–34.0)
MCHC: 34.7 g/dL (ref 30.0–36.0)
MCV: 91.5 fL (ref 80.0–100.0)
Platelets: 184 10*3/uL (ref 150–400)
RBC: 4.6 MIL/uL (ref 4.22–5.81)
RDW: 12.9 % (ref 11.5–15.5)
WBC: 7.6 10*3/uL (ref 4.0–10.5)
nRBC: 0 % (ref 0.0–0.2)

## 2022-02-11 LAB — GLUCOSE, CAPILLARY: Glucose-Capillary: 126 mg/dL — ABNORMAL HIGH (ref 70–99)

## 2022-02-11 LAB — SURGICAL PCR SCREEN
MRSA, PCR: NEGATIVE
Staphylococcus aureus: NEGATIVE

## 2022-02-11 NOTE — Progress Notes (Signed)
PCP - Dr. Rochel Brome Cardiologist - Bishop Limbo, DO  PPM/ICD - Denies Device Orders - n/a Rep Notified - n/a  Chest x-ray - Denies EKG - 10/11/2021 - tracing requested Stress Test - 04/28/2020 ECHO - 07/07/2019  Cardiac Cath - 08/13/2017   Sleep Study - Yes. Per pt, study a few years ago.  CPAP - Wears CPAP nightly. Pressure Setting 7   Pt states he is not actual DM. He had a high A1c a few years ago, but it never was diagnosed with DM. CBG at PAT visit 126. Pt had tomato, avacado, boiled egg, toast and water for breakfast. He checks his sugar 1-2x/week and it ranges from 102-125  Blood Thinner Instructions: n/a Aspirin Instructions: Pt states that he has already stopped taking his ASA  NPO after midnight  COVID TEST- n/a   Anesthesia review: Yes. Extensive cardiac hx. Pt also stated that he has been told on two occasions that he has a narrow airway. Discussed with Myra Gianotti, PA-C.   Patient denies shortness of breath, fever, cough and chest pain at PAT appointment   All instructions explained to the patient, with a verbal understanding of the material. Patient agrees to go over the instructions while at home for a better understanding. Patient also instructed to self quarantine after being tested for COVID-19. The opportunity to ask questions was provided.

## 2022-02-14 NOTE — Anesthesia Preprocedure Evaluation (Signed)
Anesthesia Evaluation  Patient identified by MRN, date of birth, ID band Patient awake    Reviewed: Allergy & Precautions, NPO status , Patient's Chart, lab work & pertinent test results, reviewed documented beta blocker date and time   Airway Mallampati: III  TM Distance: >3 FB Neck ROM: Limited    Dental  (+) Teeth Intact, Dental Advisory Given   Pulmonary sleep apnea and Continuous Positive Airway Pressure Ventilation ,    Pulmonary exam normal breath sounds clear to auscultation       Cardiovascular hypertension, Pt. on home beta blockers and Pt. on medications + angina + CAD, + Past MI and + Cardiac Stents  Normal cardiovascular exam+ dysrhythmias Ventricular Fibrillation  Rhythm:Regular Rate:Normal     Neuro/Psych PSYCHIATRIC DISORDERS Anxiety Depression negative neurological ROS     GI/Hepatic negative GI ROS, Neg liver ROS,   Endo/Other  diabetes, Type 2Hypothyroidism Obesity   Renal/GU Renal disease     Musculoskeletal negative musculoskeletal ROS (+)   Abdominal   Peds  Hematology negative hematology ROS (+)   Anesthesia Other Findings Day of surgery medications reviewed with the patient.  Reproductive/Obstetrics                           Anesthesia Physical Anesthesia Plan  ASA: 3  Anesthesia Plan: General   Post-op Pain Management: Tylenol PO (pre-op)*   Induction: Intravenous  PONV Risk Score and Plan: 2 and Midazolam, Dexamethasone and Ondansetron  Airway Management Planned: Oral ETT and Video Laryngoscope Planned  Additional Equipment:   Intra-op Plan:   Post-operative Plan: Extubation in OR  Informed Consent: I have reviewed the patients History and Physical, chart, labs and discussed the procedure including the risks, benefits and alternatives for the proposed anesthesia with the patient or authorized representative who has indicated his/her understanding and  acceptance.     Dental advisory given  Plan Discussed with: CRNA  Anesthesia Plan Comments: (PAT note by Karoline Caldwell, PA-C: Follows with cardiology for history of CAD s/p anterior wall STEMI 2019, bradycardia, presyncope (medication induced, improved with reduction in antihypertensive medications).  He presented with a STEMI in March 2019; prior to [facility] he developed polymorphic VT requiring cardioversion, after which he had 2 episodes of VF during LHC requiring defibrillation. During [facility] 07/2017 he was found to have a subtotal occlusion of the proximal LAD; treated with a DES. Unfortunately, the patient suffered a early stent reocclusion requiring additional balloon expansion. LVEF at that time was mildly reduced at 45-50%.  Stress echo 04/28/2020 negative for ischemia however submaximal cardiac workload was achieved during testing. Echo 03/2020 noted mild LVH. LVEF 55-60%. Normal LV filling pattern. No significant valvular stenosis or regurgitation.  Last seen 10/11/2021, doing well at that time, no changes to management.  Recommended 1 year follow-up.  Patient has clearance from PCP and cardiologist, copies on chart.  OSA on CPAP.  Diet controlled DM2, last A1c 5.5 on 12/20/2021.  Preop labs reviewed, unremarkable.  EKG 10/11/2021 (Care Everywhere): Sinus bradycardia.  Rate 52.  Stress echo 04/28/2020 (Care Everywhere): SUMMARY  negative stress echo for ischemia at a submaximal cardiac workload.  The patient had no chest pain.  The METS achieved was 10.1.  Exercise capacity was average.   TTE 04/28/2020 (Care Everywhere): FINDINGS:  LEFT VENTRICLE  Mild left ventricular hypertrophy. The left ventricular size is  normal. LV ejection fraction = 55-60%. Left ventricularsystolic  function is normal. Left ventricular filling pattern is normal.  Nuclear stress 07/09/2019 (Care Everywhere): IMPRESSION:  1. No scintigraphic evidence of prior infarction or  pharmacologically induced  ischemia.   2. Normal left ventricular wall motion.   3. Left ventricular ejection fraction 55%   4. Non invasive risk stratification*: Low   Cath/PCI 08/13/2017 (Care Everywhere): INDICATION: Anterior STEMI, VF  1. Severe stenosis, proximal LAD.  2. Otherwise nonobstructive CAD.  3. Mildly reduced LV systolic function, EF 38-75%.  4. Mild anterolateral and apical hypokinesis.  5. Mildly elevated LVEDP, 9 mmHg.  Course complicated by polymorphic VT/VF on 3 occasions.  Due to hypotension requiring phenylepherine, a triple lumen catheter was  inserted in the R femoral vein using US guidance.  Interventional Summary  Successful PCI / Xience Drug Eluting Stent of the proximal Left Anterior  Descending Coronary Artery.  After the initial stent procedure, the patient had early stent thrombosis.  This was treated with angioplasty and integrillin, followed by high pressure  angioplasty with a 45 mm [place] balloon. Post-PCI IVUS showed good stent  apposition and expansion.  Interventional Recommendations  1. Admit to CCU.  2. Integrillin transition to Brilinta/aspirin.  3. Dual antiplatelet therapy for 12 months.  4. Aggressive CVD risk factor modification.  )      Anesthesia Quick Evaluation

## 2022-02-14 NOTE — Progress Notes (Signed)
Anesthesia Chart Review:  Follows with cardiology for history of CAD s/p anterior wall STEMI 2019, bradycardia, presyncope (medication induced, improved with reduction in antihypertensive medications).  He presented with a STEMI in March 2019; prior to [facility] he developed polymorphic VT requiring cardioversion, after which he had 2 episodes of VF during LHC requiring defibrillation. During [facility] 07/2017 he was found to have a subtotal occlusion of the proximal LAD; treated with a DES. Unfortunately, the patient suffered a early stent reocclusion requiring additional balloon expansion. LVEF at that time was mildly reduced at 45-50%.  Stress echo 04/28/2020 negative for ischemia however submaximal cardiac workload was achieved during testing. Echo 03/2020 noted mild LVH. LVEF 55-60%. Normal LV filling pattern. No significant valvular stenosis or regurgitation.  Last seen 10/11/2021, doing well at that time, no changes to management.  Recommended 1 year follow-up.  Patient has clearance from PCP and cardiologist, copies on chart.  OSA on CPAP.  Diet controlled DM2, last A1c 5.5 on 12/20/2021.  Preop labs reviewed, unremarkable.  EKG 10/11/2021 (Care Everywhere): Sinus bradycardia.  Rate 52.  Stress echo 04/28/2020 (Care Everywhere): SUMMARY  negative stress echo for ischemia at a submaximal cardiac workload.  The patient had no chest pain.  The METS achieved was 10.1.  Exercise capacity was average.   TTE 04/28/2020 (Care Everywhere): FINDINGS:  LEFT VENTRICLE  Mild left ventricular hypertrophy. The left ventricular size is  normal. LV ejection fraction = 55-60%. Left ventricular systolic  function is normal. Left ventricular filling pattern is normal.   Nuclear stress 07/09/2019 (Care Everywhere): IMPRESSION:  1. No scintigraphic evidence of prior infarction or  pharmacologically induced ischemia.   2. Normal left ventricular wall motion.   3. Left ventricular ejection fraction 55%   4. Non  invasive risk stratification*: Low   Cath/PCI 08/13/2017 (Care Everywhere): INDICATION: Anterior STEMI, VF  1. Severe stenosis, proximal LAD.  2. Otherwise nonobstructive CAD.  3. Mildly reduced LV systolic function, EF 85-46%.  4. Mild anterolateral and apical hypokinesis.  5. Mildly elevated LVEDP, 9 mmHg.  Course complicated by polymorphic VT/VF on 3 occasions.  Due to hypotension requiring phenylepherine, a triple lumen catheter was  inserted in the R femoral vein using US guidance.  Interventional Summary  Successful PCI / Xience Drug Eluting Stent of the proximal Left Anterior  Descending Coronary Artery.  After the initial stent procedure, the patient had early stent thrombosis.  This was treated with angioplasty and integrillin, followed by high pressure  angioplasty with a 45 mm [place] balloon. Post-PCI IVUS showed good stent  apposition and expansion.  Interventional Recommendations  1. Admit to CCU.  2. Integrillin transition to Brilinta/aspirin.  3. Dual antiplatelet therapy for 12 months.  4. Aggressive CVD risk factor modification.     Wynonia Musty [facility] Short Stay Center/Anesthesiology Phone [phone] 02/14/2022 3:19 PM

## 2022-02-17 ENCOUNTER — Encounter (HOSPITAL_COMMUNITY): Payer: Self-pay | Admitting: Orthopedic Surgery

## 2022-02-17 ENCOUNTER — Observation Stay (HOSPITAL_COMMUNITY)
Admission: RE | Admit: 2022-02-17 | Discharge: 2022-02-18 | Disposition: A | Discharge status: Home / Self Care | Payer: No Typology Code available for payment source | Attending: Orthopedic Surgery | Admitting: Orthopedic Surgery

## 2022-02-17 ENCOUNTER — Ambulatory Visit (HOSPITAL_COMMUNITY): Payer: Managed Care, Other (non HMO)

## 2022-02-17 ENCOUNTER — Ambulatory Visit (HOSPITAL_BASED_OUTPATIENT_CLINIC_OR_DEPARTMENT_OTHER): Payer: No Typology Code available for payment source | Admitting: Vascular Surgery

## 2022-02-17 ENCOUNTER — Ambulatory Visit (HOSPITAL_COMMUNITY): Payer: No Typology Code available for payment source | Admitting: Vascular Surgery

## 2022-02-17 ENCOUNTER — Other Ambulatory Visit: Payer: Self-pay

## 2022-02-17 ENCOUNTER — Encounter (HOSPITAL_COMMUNITY)
Admission: RE | Disposition: A | Discharge status: Home / Self Care | Payer: Self-pay | Source: Home / Self Care | Attending: Orthopedic Surgery

## 2022-02-17 DIAGNOSIS — Z79899 Other long term (current) drug therapy: Secondary | ICD-10-CM | POA: Insufficient documentation

## 2022-02-17 DIAGNOSIS — I10 Essential (primary) hypertension: Secondary | ICD-10-CM | POA: Diagnosis not present

## 2022-02-17 DIAGNOSIS — I119 Hypertensive heart disease without heart failure: Secondary | ICD-10-CM | POA: Insufficient documentation

## 2022-02-17 DIAGNOSIS — E119 Type 2 diabetes mellitus without complications: Secondary | ICD-10-CM | POA: Diagnosis not present

## 2022-02-17 DIAGNOSIS — Z7982 Long term (current) use of aspirin: Secondary | ICD-10-CM | POA: Insufficient documentation

## 2022-02-17 DIAGNOSIS — Z85828 Personal history of other malignant neoplasm of skin: Secondary | ICD-10-CM | POA: Diagnosis not present

## 2022-02-17 DIAGNOSIS — Z9889 Other specified postprocedural states: Secondary | ICD-10-CM

## 2022-02-17 DIAGNOSIS — M4722 Other spondylosis with radiculopathy, cervical region: Secondary | ICD-10-CM | POA: Diagnosis present

## 2022-02-17 DIAGNOSIS — I25119 Atherosclerotic heart disease of native coronary artery with unspecified angina pectoris: Secondary | ICD-10-CM | POA: Diagnosis not present

## 2022-02-17 DIAGNOSIS — I2510 Atherosclerotic heart disease of native coronary artery without angina pectoris: Secondary | ICD-10-CM | POA: Insufficient documentation

## 2022-02-17 DIAGNOSIS — I252 Old myocardial infarction: Secondary | ICD-10-CM | POA: Diagnosis not present

## 2022-02-17 HISTORY — PX: ANTERIOR CERVICAL DECOMP/DISCECTOMY FUSION: SHX1161

## 2022-02-17 LAB — GLUCOSE, CAPILLARY
Glucose-Capillary: 111 mg/dL — ABNORMAL HIGH (ref 70–99)
Glucose-Capillary: 123 mg/dL — ABNORMAL HIGH (ref 70–99)
Glucose-Capillary: 132 mg/dL — ABNORMAL HIGH (ref 70–99)

## 2022-02-17 SURGERY — ANTERIOR CERVICAL DECOMPRESSION/DISCECTOMY FUSION 2 LEVELS
Anesthesia: General | Site: Spine Cervical

## 2022-02-17 MED ORDER — LEVOTHYROXINE SODIUM 112 MCG PO TABS
112.0000 ug | ORAL_TABLET | Freq: Every day | ORAL | Status: DC
  Administered 2022-02-18: 112 ug via ORAL
  Filled 2022-02-17: qty 1

## 2022-02-17 MED ORDER — ONDANSETRON HCL 4 MG/2ML IJ SOLN: 4.0000 mg | Freq: Four times a day (QID) | INTRAMUSCULAR | Status: DC | PRN

## 2022-02-17 MED ORDER — SURGIFLO WITH THROMBIN (HEMOSTATIC MATRIX KIT) OPTIME
TOPICAL | Status: DC | PRN
  Administered 2022-02-17 (×3): 1 via TOPICAL

## 2022-02-17 MED ORDER — CHLORHEXIDINE GLUCONATE 0.12 % MT SOLN
15.0000 mL | Freq: Once | OROMUCOSAL | Status: AC
  Administered 2022-02-17: 15 mL via OROMUCOSAL
  Filled 2022-02-17: qty 15

## 2022-02-17 MED ORDER — LIDOCAINE 2% (20 MG/ML) 5 ML SYRINGE
INTRAMUSCULAR | Status: DC | PRN
  Administered 2022-02-17: 80 mg via INTRAVENOUS

## 2022-02-17 MED ORDER — THROMBIN 20000 UNITS EX SOLR
CUTANEOUS | Status: DC | PRN
  Administered 2022-02-17: 20 mL via TOPICAL

## 2022-02-17 MED ORDER — LACTATED RINGERS IV SOLN: INTRAVENOUS | Status: DC

## 2022-02-17 MED ORDER — DEXAMETHASONE SODIUM PHOSPHATE 4 MG/ML IJ SOLN
4.0000 mg | Freq: Four times a day (QID) | INTRAMUSCULAR | Status: DC
  Administered 2022-02-17: 4 mg via INTRAVENOUS
  Filled 2022-02-17: qty 1

## 2022-02-17 MED ORDER — 0.9 % SODIUM CHLORIDE (POUR BTL) OPTIME
TOPICAL | Status: DC | PRN
  Administered 2022-02-17 (×2): 1000 mL

## 2022-02-17 MED ORDER — EPHEDRINE SULFATE-NACL 50-0.9 MG/10ML-% IV SOSY
PREFILLED_SYRINGE | INTRAVENOUS | Status: DC | PRN
  Administered 2022-02-17: 10 mg via INTRAVENOUS

## 2022-02-17 MED ORDER — CARVEDILOL 6.25 MG PO TABS
6.2500 mg | ORAL_TABLET | Freq: Two times a day (BID) | ORAL | Status: DC
  Administered 2022-02-17: 6.25 mg via ORAL
  Filled 2022-02-17: qty 1

## 2022-02-17 MED ORDER — ATORVASTATIN CALCIUM 80 MG PO TABS
80.0000 mg | ORAL_TABLET | Freq: Every day | ORAL | Status: DC
  Administered 2022-02-17: 80 mg via ORAL
  Filled 2022-02-17: qty 1

## 2022-02-17 MED ORDER — IRBESARTAN 150 MG PO TABS: 300.0000 mg | ORAL_TABLET | Freq: Every day | ORAL | Status: DC

## 2022-02-17 MED ORDER — BUPIVACAINE-EPINEPHRINE 0.25% -1:200000 IJ SOLN
INTRAMUSCULAR | Status: DC | PRN
  Administered 2022-02-17: 6 mL

## 2022-02-17 MED ORDER — CEFAZOLIN SODIUM-DEXTROSE 2-4 GM/100ML-% IV SOLN: 2.0000 g | INTRAVENOUS | Status: DC

## 2022-02-17 MED ORDER — METHOCARBAMOL 500 MG PO TABS: 500.0000 mg | ORAL_TABLET | Freq: Three times a day (TID) | ORAL | 0 refills | Status: AC | PRN

## 2022-02-17 MED ORDER — ONDANSETRON HCL 4 MG PO TABS: 4.0000 mg | ORAL_TABLET | Freq: Four times a day (QID) | ORAL | Status: DC | PRN

## 2022-02-17 MED ORDER — LATANOPROST 0.005 % OP SOLN
1.0000 [drp] | Freq: Every day | OPHTHALMIC | Status: DC
  Administered 2022-02-17: 1 [drp] via OPHTHALMIC
  Filled 2022-02-17: qty 2.5

## 2022-02-17 MED ORDER — FENTANYL CITRATE (PF) 250 MCG/5ML IJ SOLN
INTRAMUSCULAR | Status: DC | PRN
  Administered 2022-02-17: 100 ug via INTRAVENOUS
  Administered 2022-02-17 (×4): 25 ug via INTRAVENOUS
  Administered 2022-02-17: 50 ug via INTRAVENOUS

## 2022-02-17 MED ORDER — OXYCODONE-ACETAMINOPHEN 10-325 MG PO TABS: 1.0000 | ORAL_TABLET | Freq: Four times a day (QID) | ORAL | 0 refills | Status: AC | PRN

## 2022-02-17 MED ORDER — SUGAMMADEX SODIUM 200 MG/2ML IV SOLN
INTRAVENOUS | Status: DC | PRN
  Administered 2022-02-17: 237.2 mg via INTRAVENOUS

## 2022-02-17 MED ORDER — DEXAMETHASONE 4 MG PO TABS
4.0000 mg | ORAL_TABLET | Freq: Four times a day (QID) | ORAL | Status: DC
  Administered 2022-02-17 – 2022-02-18 (×2): 4 mg via ORAL
  Filled 2022-02-17 (×2): qty 1

## 2022-02-17 MED ORDER — BUPIVACAINE-EPINEPHRINE (PF) 0.25% -1:200000 IJ SOLN
INTRAMUSCULAR | Status: AC
  Filled 2022-02-17: qty 30

## 2022-02-17 MED ORDER — NITROGLYCERIN 0.4 MG SL SUBL: 0.4000 mg | SUBLINGUAL_TABLET | SUBLINGUAL | Status: DC | PRN

## 2022-02-17 MED ORDER — PHENYLEPHRINE 80 MCG/ML (10ML) SYRINGE FOR IV PUSH (FOR BLOOD PRESSURE SUPPORT)
PREFILLED_SYRINGE | INTRAVENOUS | Status: AC
  Filled 2022-02-17: qty 10

## 2022-02-17 MED ORDER — ROCURONIUM BROMIDE 10 MG/ML (PF) SYRINGE
PREFILLED_SYRINGE | INTRAVENOUS | Status: DC | PRN
  Administered 2022-02-17: 10 mg via INTRAVENOUS
  Administered 2022-02-17: 60 mg via INTRAVENOUS
  Administered 2022-02-17: 10 mg via INTRAVENOUS
  Administered 2022-02-17: 30 mg via INTRAVENOUS
  Administered 2022-02-17 (×3): 10 mg via INTRAVENOUS

## 2022-02-17 MED ORDER — FENTANYL CITRATE (PF) 250 MCG/5ML IJ SOLN
INTRAMUSCULAR | Status: AC
  Filled 2022-02-17: qty 5

## 2022-02-17 MED ORDER — THROMBIN 20000 UNITS EX SOLR
CUTANEOUS | Status: AC
  Filled 2022-02-17: qty 20000

## 2022-02-17 MED ORDER — ONDANSETRON HCL 4 MG PO TABS: 4.0000 mg | ORAL_TABLET | Freq: Three times a day (TID) | ORAL | 0 refills | Status: DC | PRN

## 2022-02-17 MED ORDER — MIDAZOLAM HCL 2 MG/2ML IJ SOLN
INTRAMUSCULAR | Status: AC
  Filled 2022-02-17: qty 2

## 2022-02-17 MED ORDER — ACETAMINOPHEN 325 MG PO TABS: 650.0000 mg | ORAL_TABLET | Freq: Four times a day (QID) | ORAL | Status: DC | PRN

## 2022-02-17 MED ORDER — ROCURONIUM BROMIDE 10 MG/ML (PF) SYRINGE
PREFILLED_SYRINGE | INTRAVENOUS | Status: AC
  Filled 2022-02-17: qty 10

## 2022-02-17 MED ORDER — ONDANSETRON HCL 4 MG/2ML IJ SOLN
INTRAMUSCULAR | Status: DC | PRN
  Administered 2022-02-17: 4 mg via INTRAVENOUS

## 2022-02-17 MED ORDER — PHENYLEPHRINE HCL-NACL 20-0.9 MG/250ML-% IV SOLN
INTRAVENOUS | Status: DC | PRN
  Administered 2022-02-17: 30 ug/min via INTRAVENOUS

## 2022-02-17 MED ORDER — CITALOPRAM HYDROBROMIDE 20 MG PO TABS: 20.0000 mg | ORAL_TABLET | Freq: Every day | ORAL | Status: DC

## 2022-02-17 MED ORDER — OXYCODONE HCL 5 MG PO TABS
5.0000 mg | ORAL_TABLET | ORAL | Status: DC | PRN
  Administered 2022-02-17 – 2022-02-18 (×4): 10 mg via ORAL
  Filled 2022-02-17 (×4): qty 2

## 2022-02-17 MED ORDER — ACETAMINOPHEN 500 MG PO TABS
1000.0000 mg | ORAL_TABLET | Freq: Once | ORAL | Status: AC
  Administered 2022-02-17: 1000 mg via ORAL
  Filled 2022-02-17: qty 2

## 2022-02-17 MED ORDER — TRANEXAMIC ACID-NACL 1000-0.7 MG/100ML-% IV SOLN
INTRAVENOUS | Status: AC
  Filled 2022-02-17: qty 100

## 2022-02-17 MED ORDER — DEXAMETHASONE SODIUM PHOSPHATE 10 MG/ML IJ SOLN
INTRAMUSCULAR | Status: DC | PRN
  Administered 2022-02-17: 10 mg via INTRAVENOUS

## 2022-02-17 MED ORDER — VANCOMYCIN HCL 1250 MG/250ML IV SOLN
1250.0000 mg | Freq: Once | INTRAVENOUS | Status: AC
  Administered 2022-02-17: 1250 mg via INTRAVENOUS
  Filled 2022-02-17: qty 250

## 2022-02-17 MED ORDER — MIDAZOLAM HCL 5 MG/5ML IJ SOLN
INTRAMUSCULAR | Status: DC | PRN
  Administered 2022-02-17: 2 mg via INTRAVENOUS

## 2022-02-17 MED ORDER — LAMOTRIGINE 150 MG PO TABS
150.0000 mg | ORAL_TABLET | Freq: Two times a day (BID) | ORAL | Status: DC
  Administered 2022-02-17: 150 mg via ORAL
  Filled 2022-02-17 (×2): qty 1

## 2022-02-17 MED ORDER — ORAL CARE MOUTH RINSE: 15.0000 mL | Freq: Once | OROMUCOSAL | Status: AC

## 2022-02-17 MED ORDER — PROPOFOL 10 MG/ML IV BOLUS
INTRAVENOUS | Status: DC | PRN
  Administered 2022-02-17: 60 mg via INTRAVENOUS
  Administered 2022-02-17: 140 mg via INTRAVENOUS

## 2022-02-17 MED ORDER — ONDANSETRON HCL 4 MG/2ML IJ SOLN
INTRAMUSCULAR | Status: AC
  Filled 2022-02-17: qty 2

## 2022-02-17 MED ORDER — SUGAMMADEX SODIUM 500 MG/5ML IV SOLN
INTRAVENOUS | Status: AC
  Filled 2022-02-17: qty 5

## 2022-02-17 MED ORDER — LIDOCAINE 2% (20 MG/ML) 5 ML SYRINGE
INTRAMUSCULAR | Status: AC
  Filled 2022-02-17: qty 5

## 2022-02-17 MED ORDER — PROMETHAZINE HCL 25 MG/ML IJ SOLN: 6.2500 mg | INTRAMUSCULAR | Status: DC | PRN

## 2022-02-17 MED ORDER — GLYCOPYRROLATE 0.2 MG/ML IJ SOLN
INTRAMUSCULAR | Status: DC | PRN
  Administered 2022-02-17: .2 mg via INTRAVENOUS

## 2022-02-17 MED ORDER — DEXAMETHASONE SODIUM PHOSPHATE 10 MG/ML IJ SOLN
INTRAMUSCULAR | Status: AC
  Filled 2022-02-17: qty 1

## 2022-02-17 MED ORDER — PHENYLEPHRINE 80 MCG/ML (10ML) SYRINGE FOR IV PUSH (FOR BLOOD PRESSURE SUPPORT)
PREFILLED_SYRINGE | INTRAVENOUS | Status: DC | PRN
  Administered 2022-02-17: 160 ug via INTRAVENOUS

## 2022-02-17 MED ORDER — FENTANYL CITRATE (PF) 100 MCG/2ML IJ SOLN: 25.0000 ug | INTRAMUSCULAR | Status: DC | PRN

## 2022-02-17 MED ORDER — VANCOMYCIN HCL 1500 MG/300ML IV SOLN
1500.0000 mg | Freq: Once | INTRAVENOUS | Status: AC
  Administered 2022-02-17: 1500 mg via INTRAVENOUS
  Filled 2022-02-17: qty 300

## 2022-02-17 MED ORDER — METHOCARBAMOL 500 MG PO TABS
500.0000 mg | ORAL_TABLET | Freq: Four times a day (QID) | ORAL | Status: DC | PRN
  Administered 2022-02-17 – 2022-02-18 (×2): 500 mg via ORAL
  Filled 2022-02-17 (×2): qty 1

## 2022-02-17 MED ORDER — LACTATED RINGERS IV SOLN: INTRAVENOUS | Status: DC | PRN

## 2022-02-17 MED ORDER — TRANEXAMIC ACID-NACL 1000-0.7 MG/100ML-% IV SOLN
INTRAVENOUS | Status: DC | PRN
  Administered 2022-02-17: 1000 mg via INTRAVENOUS

## 2022-02-17 SURGICAL SUPPLY — 64 items
BAG COUNTER SPONGE SURGICOUNT (BAG) ×1 IMPLANT
BLADE CLIPPER SURG (BLADE) IMPLANT
BUR EGG ELITE 4.0 (BURR) IMPLANT
BUR MATCHSTICK NEURO 3.0 LAGG (BURR) IMPLANT
CABLE BIPOLOR RESECTION CORD (MISCELLANEOUS) ×1 IMPLANT
CANISTER SUCT 3000ML PPV (MISCELLANEOUS) ×1 IMPLANT
CLSR STERI-STRIP ANTIMIC 1/2X4 (GAUZE/BANDAGES/DRESSINGS) ×1 IMPLANT
COVER MAYO STAND STRL (DRAPES) ×3 IMPLANT
COVER SURGICAL LIGHT HANDLE (MISCELLANEOUS) ×2 IMPLANT
DEVICE ENDSKLTN IMPL 16X14X7X6 (Cage) IMPLANT
DRAIN CHANNEL 15F RND FF W/TCR (WOUND CARE) IMPLANT
DRAPE C-ARM 42X72 X-RAY (DRAPES) ×1 IMPLANT
DRAPE POUCH INSTRU U-SHP 10X18 (DRAPES) ×1 IMPLANT
DRAPE SURG 17X23 STRL (DRAPES) ×1 IMPLANT
DRAPE U-SHAPE 47X51 STRL (DRAPES) ×1 IMPLANT
DRSG OPSITE POSTOP 3X4 (GAUZE/BANDAGES/DRESSINGS) ×1 IMPLANT
DRSG OPSITE POSTOP 4X6 (GAUZE/BANDAGES/DRESSINGS) IMPLANT
DURAPREP 26ML APPLICATOR (WOUND CARE) ×1 IMPLANT
ELECT COATED BLADE 2.86 ST (ELECTRODE) ×1 IMPLANT
ELECT PENCIL ROCKER SW 15FT (MISCELLANEOUS) ×1 IMPLANT
ELECT REM PT RETURN 9FT ADLT (ELECTROSURGICAL) ×1
ELECTRODE REM PT RTRN 9FT ADLT (ELECTROSURGICAL) ×1 IMPLANT
ENDOSKELETON IMPLANT 16X14X7X6 (Cage) ×2 IMPLANT
GLOVE BIO SURGEON STRL SZ 6.5 (GLOVE) ×1 IMPLANT
GLOVE BIOGEL PI IND STRL 6.5 (GLOVE) ×1 IMPLANT
GLOVE BIOGEL PI IND STRL 8.5 (GLOVE) ×1 IMPLANT
GLOVE SS BIOGEL STRL SZ 8.5 (GLOVE) ×1 IMPLANT
GOWN STRL REUS W/ TWL LRG LVL3 (GOWN DISPOSABLE) ×1 IMPLANT
GOWN STRL REUS W/TWL 2XL LVL3 (GOWN DISPOSABLE) ×1 IMPLANT
GOWN STRL REUS W/TWL LRG LVL3 (GOWN DISPOSABLE) ×3
KIT BASIN OR (CUSTOM PROCEDURE TRAY) ×1 IMPLANT
KIT TURNOVER KIT B (KITS) ×1 IMPLANT
NDL SPNL 18GX3.5 QUINCKE PK (NEEDLE) ×1 IMPLANT
NEEDLE HYPO 22GX1.5 SAFETY (NEEDLE) ×1 IMPLANT
NEEDLE SPNL 18GX3.5 QUINCKE PK (NEEDLE) ×1 IMPLANT
NS IRRIG 1000ML POUR BTL (IV SOLUTION) ×1 IMPLANT
PACK ORTHO CERVICAL (CUSTOM PROCEDURE TRAY) ×1 IMPLANT
PACK UNIVERSAL I (CUSTOM PROCEDURE TRAY) ×1 IMPLANT
PAD ARMBOARD 7.5X6 YLW CONV (MISCELLANEOUS) ×2 IMPLANT
PATTIES SURGICAL .25X.25 (GAUZE/BANDAGES/DRESSINGS) ×1 IMPLANT
PIN DISTRACTION MAXCESS-C 14 (PIN) IMPLANT
PLATE ACP 1.6X40 2LVL (Plate) IMPLANT
POSITIONER HEAD DONUT 9IN (MISCELLANEOUS) ×1 IMPLANT
PUTTY BONE DBX 2.5 MIS (Bone Implant) IMPLANT
RESTRAINT LIMB HOLDER UNIV (RESTRAINTS) ×1 IMPLANT
SCREW ACP VA SD 3.5X15 (Screw) IMPLANT
SCREW VA ST 4.0X15 (Screw) IMPLANT
SPONGE INTESTINAL PEANUT (DISPOSABLE) ×1 IMPLANT
SPONGE SURGIFOAM ABS GEL 100 (HEMOSTASIS) ×1 IMPLANT
SPONGE T-LAP 4X18 ~~LOC~~+RFID (SPONGE) ×2 IMPLANT
SURGIFLO W/THROMBIN 8M KIT (HEMOSTASIS) IMPLANT
SUT BONE WAX W31G (SUTURE) ×1 IMPLANT
SUT MNCRL AB 3-0 PS2 27 (SUTURE) ×1 IMPLANT
SUT MON AB 2-0 CT1 36 (SUTURE) IMPLANT
SUT SILK 2 0 (SUTURE) ×1
SUT SILK 2-0 18XBRD TIE 12 (SUTURE) IMPLANT
SUT VIC AB 2-0 CT1 18 (SUTURE) ×1 IMPLANT
SYR BULB IRRIG 60ML STRL (SYRINGE) ×1 IMPLANT
SYR CONTROL 10ML LL (SYRINGE) ×1 IMPLANT
TAPE CLOTH 4X10 WHT NS (GAUZE/BANDAGES/DRESSINGS) ×1 IMPLANT
TAPE UMBILICAL COTTON 1/8X30 (MISCELLANEOUS) ×1 IMPLANT
TOWEL GREEN STERILE (TOWEL DISPOSABLE) ×1 IMPLANT
TOWEL GREEN STERILE FF (TOWEL DISPOSABLE) ×1 IMPLANT
WATER STERILE IRR 1000ML POUR (IV SOLUTION) ×1 IMPLANT

## 2022-02-17 NOTE — Transfer of Care (Signed)
Immediate Anesthesia Transfer of Care Note  Patient: Gerald Jordan  Procedure(s) Performed: ANTERIOR CERVICAL DECOMPRESSION/DISCECTOMY FUSION  CERVICAL FIVE-SEVEN (Spine Cervical)  Patient Location: PACU  Anesthesia Type:General  Level of Consciousness: awake, alert  and oriented  Airway & Oxygen Therapy: Patient Spontanous Breathing and Patient connected to face mask oxygen  Post-op Assessment: Report given to RN, Post -op Vital signs reviewed and stable, Patient moving all extremities X 4 and Patient able to stick tongue midline  Post vital signs: Reviewed  Last Vitals:  Vitals Value Taken Time  BP 135/80 02/17/22 1500  Temp 98.7   Pulse 62 02/17/22 1501  Resp 21 02/17/22 1501  SpO2 98 % 02/17/22 1501  Vitals shown include unvalidated device data.  Last Pain:  Vitals:   02/17/22 0937  TempSrc:   PainSc: 0-No pain      Patients Stated Pain Goal: 0 (63/[id number])  Complications: No notable events documented.

## 2022-02-17 NOTE — Discharge Instructions (Signed)

## 2022-02-17 NOTE — H&P (Signed)
History:  Gerald Jordan presents for evaluation of cervical pain after a injury on 12/30/2020. Since the injury he has had physical therapy and recently had a left C6 and C7 selective nerve root block. At this point time his overall quality of life is continued to deteriorate. At this point given the neuropathic left arm and neck pain despite the injection therapy, physical therapy, medications, and activity modification he is elected to proceed with surgery.  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   Current Outpatient Medications on File Prior to Encounter  Medication Sig Dispense Refill   atorvastatin (LIPITOR) 80 MG tablet Take 1 tablet (80 mg total) by mouth daily. (Patient taking differently: Take 80 mg by mouth every evening.) 90 tablet 3   carvedilol (COREG) 6.25 MG tablet Take 1 tablet (6.25 mg total) by mouth 2 (two) times daily. 14 tablet 0   citalopram (CELEXA) 20 MG tablet Take 1 tablet (20 mg total) by mouth daily. 90 tablet 3    lamoTRIgine (LAMICTAL) 150 MG tablet Take 1 tablet (150 mg total) by mouth 2 (two) times daily. Take two tablets once a day. (Patient taking differently: Take 150 mg by mouth 2 (two) times daily.) 180 tablet 3   latanoprost (XALATAN) 0.005 % ophthalmic solution Place 1 drop into both eyes nightly. 2.5 mL 2   levothyroxine (SYNTHROID) 112 MCG tablet Take 1 tablet (112 mcg total) by mouth daily. 90 tablet 3   nitroGLYCERIN (NITROSTAT) 0.4 MG SL tablet Place 1 tablet (0.4 mg total) under the tongue every 5 (five) minutes as needed for chest pain. 25 tablet 0   NON FORMULARY Pt uses a cpap nightly     telmisartan (MICARDIS) 80 MG tablet Take 1 tablet (80 mg total) by mouth daily. 90 tablet 0   aspirin 81 MG tablet Take 81 mg by mouth daily.     glucose blood test strip 1 each by Other route daily. Use as instructed 100 each 3   triamcinolone cream (KENALOG) 0.1 % Apply 1 Application topically 2 (two) times daily. (Patient not taking: Reported on 02/09/2022) 30 g 1    Physical Exam: Clinical exam: Qasim is a pleasant individual, who appears younger than their stated age.  He is alert and orientated 3.  No shortness of breath, chest pain.  Abdomen is soft and non-tender, negative loss of bowel and bladder control, no rebound tenderness.  Negative:  skin lesions abrasions contusions  Peripheral pulses: 2+ radial artery pulses bilaterally. Compartment soft and nontender.  Gait pattern:normal  Assistive devices: None  Neuro: 5/5 motor strength in the upper extremity bilaterally. Negative Spurling sign, positive dysesthesias and decreased sensation to light touch in the left C6 and C7 dermatome. Negative Hoffman test, symmetrical 1+ deep tendon reflexes.  Musculoskeletal: Significant neck pain with palpation and range of motion. Pain radiates into the left trapezius and left upper extremity.  X-rays of the cervical spine demonstrate some mild degenerative disc disease with slight anterior  traction spurs C3-6.  Cervical MRI: completed on 12/31/2021: No cord signal changes. No fracture or abnormal marrow signal change. Scattered mild degenerative changes with severe foraminal stenosis at C5-6 and moderate to severe foraminal narrowing at C6-7.  A/P: Diagnosis: [name] presents for evaluation of cervical pain after a injury on 12/30/2020. Since the injury he has had physical therapy and recently had a left C6 and C7 selective nerve root block.   At this point time his overall quality of life is continued to deteriorate. At this point given the neuropathic left arm and neck pain despite the injection therapy, physical therapy, medications, and activity modification he is elected to proceed with surgery.   Surgical plan would be a two-level ACDF C5-7. I have gone over the surgical procedure in great detail including the risks and benefits and all of his and his wife's questions were addressed.   Risks and benefits of surgery were discussed with the patient. These include: Infection, bleeding, death, stroke, paralysis, ongoing or worse pain, need for additional surgery, nonunion, leak of spinal fluid, adjacent segment degeneration requiring additional fusion surgery. Pseudoarthrosis (nonunion)requiring supplemental posterior fixation. Throat pain, swallowing difficulties, hoarseness or change in voice.

## 2022-02-17 NOTE — Progress Notes (Signed)
Pacu RN Report to floor given  Gave report to  [employer]. Room: 3C03  Discussed surgery, meds given in OR and Pacu, VS, IV fluids given, EBL, urine output, pain and other pertinent information. Also discussed if pt had any family or friends here or belongings with them.    Discussed pt's dressing, Aspen collar in place, ok per Dr Rolena Infante to take off for a few minutes to put ice to incision. Removed catheter, DTV 1800-2000. His wife has been updated. Pt moving all extremities, neuro is intact, tongue midline, smile equal, shoulder shrugs and raising eyebrows equal, grasps equal/strong, dorsi/plantar flexion strong.   Pt exits my care.

## 2022-02-17 NOTE — Progress Notes (Signed)
Pharmacy Antibiotic Note  Gerald Jordan is a 61 y.o. male admitted on 02/17/2022 for two-level ACDF C5-7. Pharmacy has been consulted for vancomycin dosing for surgical prophylaxis x 1 dose.  Patient received vancomycin '1500mg'$  at 1130 today prior to surgery. Renal function is at baseline with eCrCl 120 mL/min.  Plan: Give vancomycin '1250mg'$  x 1 dose at 2200 on 9/21 (approximately 12 hours after pre-op dose). No further doses necessary.  Height: '5\' 9"'$  (175.3 cm) Weight: 118.6 kg (261 lb 6.4 oz) IBW/kg (Calculated) : 70.7  Temp (24hrs), Avg:98.6 F (37 C), Min:98.2 F (36.8 C), Max:98.8 F (37.1 C)  Recent Labs  Lab 02/11/22 0958  WBC 7.6  CREATININE 0.80    Estimated Creatinine Clearance: 123.3 mL/min (by C-G formula based on SCr of 0.8 mg/dL).    Allergies  Allergen Reactions   Penicillins Nausea And Vomiting    Mouth Ulcers - tolerated Augmentin 09/2012     Lisinopril Cough    Thank you for allowing pharmacy to be a part of this patient's care.  Merrilee Jansky, PharmD Clinical Pharmacist 02/17/2022 4:47 PM

## 2022-02-17 NOTE — Anesthesia Postprocedure Evaluation (Signed)
Anesthesia Post Note  Patient: Gerald Jordan  Procedure(s) Performed: ANTERIOR CERVICAL DECOMPRESSION/DISCECTOMY FUSION  CERVICAL FIVE-SEVEN (Spine Cervical)     Patient location during evaluation: PACU Anesthesia Type: General Level of consciousness: awake and alert Pain management: pain level controlled Vital Signs Assessment: post-procedure vital signs reviewed and stable Respiratory status: spontaneous breathing, nonlabored ventilation, respiratory function stable and patient connected to nasal cannula oxygen Cardiovascular status: blood pressure returned to baseline and stable Postop Assessment: no apparent nausea or vomiting Anesthetic complications: no   No notable events documented.  Last Vitals:  Vitals:   02/17/22 1620 02/17/22 1637  BP:  129/79  Pulse: 63 (!) 59  Resp: 17 18  Temp: 37.1 C   SpO2: 93% 95%    Last Pain:  Vitals:   02/17/22 1545  TempSrc:   PainSc: 0-No pain                 Santa Lighter

## 2022-02-17 NOTE — Anesthesia Procedure Notes (Addendum)
Procedure Name: Intubation Date/Time: 02/17/2022 11:51 AM  Performed by: Maude Leriche, CRNAPre-anesthesia Checklist: Patient identified, Emergency Drugs available, Suction available and Patient being monitored Patient Re-evaluated:Patient Re-evaluated prior to induction Oxygen Delivery Method: Circle system utilized Preoxygenation: Pre-oxygenation with 100% oxygen Induction Type: IV induction Ventilation: Two handed mask ventilation required Laryngoscope Size: Glidescope and 4 Grade View: Grade I Tube type: Oral Tube size: 7.5 mm Number of attempts: 1 Airway Equipment and Method: Stylet, Video-laryngoscopy and Bite block Placement Confirmation: ETT inserted through vocal cords under direct vision, positive ETCO2 and breath sounds checked- equal and bilateral Secured at: 24 cm Tube secured with: Tape Dental Injury: Teeth and Oropharynx as per pre-operative assessment  Comments: Elective glidescope for c-spine mobilization, excessive redundant tissue observed, normal glottic aperature

## 2022-02-17 NOTE — Brief Op Note (Signed)
02/17/2022  3:04 PM  PATIENT:  Gerald Jordan  61 y.o. 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

## 2022-02-17 NOTE — Op Note (Signed)
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  15 mm locking screws.  Tightening intervertebral cage: 7 mm medium lordotic at both levels  Graft: DBX mix  BRIEF HISTORY: CEVIN RUBINSTEIN is a 61 y.o. male who unfortunately had an injury with resultant significant neck and radicular arm pain attempts at conservative management failed to alleviate his pain and improve his quality of life.  As result we elected to move forward with surgery.  All appropriate risks, benefits, and alternatives were discussed with the patient and consent was obtained  PROCEDURE DETAILS: Patient was brought into the operating room and was properly positioned on the operating room table.  After induction with general anesthesia the patient was endotracheally intubated.  A timeout was taken to confirm all important data: including patient, procedure, and the level. Teds, SCD's were applied.   Foley was placed by the nurse, and the anterior cervical spine was prepped and draped in a standard fashion.  Using fluoroscopy identified the C6 vertebral body and marked out my incision.  The incision was infiltrated with quarter percent Marcaine with epinephrine.  Transverse incision was made and sharp dissection was carried out down to and through the platysma.  I continued to sharply dissect along the medial border the sternocleidomastoid performing a standard Smith-Robinson approach to the cervical spine.  The omohyoid was released from its sling in order to aid in retraction.  I then identified the  trachea and esophagus and mobilized to the right and protected with a hand-held retractor.  Using Kitner dissectors I remove the remainder of the prevertebral fascia to expose the entire C5-C6 disc space and C6-7 disc space.  The needle was placed into the 5 6 disc space and an x-ray was taken confirming that I was at the appropriate level.  I then mobilized the longus coli muscle with bipolar cautery from the superior aspect of C5 to the inferior aspect of C7.  The anterior osteophytes at the disc space level removed with Leksell rongeur.  Caspar retracting blades were placed underneath the longus coli muscle and the endotracheal cuff was deflated and the retractor was expanded to the appropriate width. The endotracheal cuff was then reinflated an annulotomy and C6-7 was performed and I removed all of the disc material with pituitary rongeurs.  The overhanging osteophyte from the inferior aspect of the C6 vertebral body was removed with Kerrison rongeur.  Distraction pins were placed into the body of C6 and C7 and I distracted the intervertebral disc space and maintained the distraction with the patient.  I then used curettes to remove the remainder of the disc.  Once I was posterior I used a fine nerve hook to gently begin dissecting through the posterior annulus and posterior longitudinal ligament was after manipulating with a nerve hook was able to develop a plane under the PLL and I resected the PLL and disc material.  The hard disc osteophyte under the uncovertebral joint was then resected and a small disc fragment was removed from the left posterior lateral corner.  At this point the discectomy was complete  and I was able to pass my nerve hook behind the vertebral bodies of C6 and C7 and under the uncovertebral joints bilaterally.  I rasped the endplates and then used the trialing device elected to use the 7 mm medium implant.  The implant was obtained and packed with the allograft and malleted into  position.  At this point I repositioned the proximal retractors to expose the C5-6 disc space.  Using the exact same technique that had used at C6-7 I performed a complete discectomy at this level.  I again used a fine nerve hook to develop a plane under the annulus and posterior longitudinal ligament and I resected the PLL.  I was then able to undercut the uncovertebral joints and remove the hard disc osteophyte from the left posterior lateral corner.  I then rasped the endplates and placed the same size implant at this level.  At this point with both levels complete and then contoured a anterior cervical plate and secured it with 15 mm locking screws.  The left C5 screw was repositioned and so I placed a rescue screw at that level.  All 6 screws had excellent purchase.  The locking mechanism was then engaged on the plate to prevent backout per manufacture standards.  At this point I copiously irrigated the wound with normal saline and made sure that hemostasis using bipolar cautery and Floseal.  The wound was again irrigated and the trach and esophagus were returned to midline.  Final x-rays were satisfactory and the platysma was closed with interrupted 2-0 Vicryl suture and skin with 3-0 Monocryl.  Steri-Strips and a dry dressing were applied and the patient was ultimately extubated and transferred the PACU without incident.  At the end of the case all needle and sponge counts were correct  Melina Schools, MD 02/17/2022 2:51 PM

## 2022-02-17 NOTE — Progress Notes (Signed)
Placed patient on CPAP for the night with pressure set at 7cm  

## 2022-02-18 ENCOUNTER — Other Ambulatory Visit: Payer: Self-pay

## 2022-02-18 DIAGNOSIS — M4722 Other spondylosis with radiculopathy, cervical region: Secondary | ICD-10-CM | POA: Diagnosis not present

## 2022-02-18 NOTE — Discharge Summary (Signed)
Patient ID: Gerald Jordan MRN: [id number] DOB/AGE: Nov 26, 1960 61 y.o.  Admit date: 02/17/2022 Discharge date: 02/18/2022  Admission Diagnoses:  Principal Problem:   Status post surgery   Discharge Diagnoses:  Principal Problem:   Status post surgery  status post Procedure(s): ANTERIOR CERVICAL DECOMPRESSION/DISCECTOMY FUSION  CERVICAL FIVE-SEVEN  Past Medical History:  Diagnosis Date   Anxiety    Atherosclerotic heart disease of native coronary artery without angina pectoris 2019   Ventricular fibrillation - cardioverted x 3 in ambulance. 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 male who was admitted 02/17/2022 for operative treatment of Status post surgery. Patient failed conservative treatments (please see the history and physical for the specifics) and had severe unremitting pain that affects sleep, daily activities and work/hobbies. After pre-op clearance, the patient was taken to the operating room on 02/17/2022 and underwent  Procedure(s): ANTERIOR CERVICAL DECOMPRESSION/DISCECTOMY FUSION  CERVICAL FIVE-SEVEN.    Patient was given perioperative antibiotics:  Anti-infectives (From admission, onward)    Start      Dose/Rate Route Frequency Ordered Stop   02/17/22 2200  vancomycin (VANCOREADY) IVPB 1250 mg/250 mL        1,250 mg 166.7 mL/hr over 90 Minutes Intravenous  Once 02/17/22 1646 02/17/22 2307   02/17/22 1130  vancomycin (VANCOREADY) IVPB 1500 mg/300 mL        1,500 mg 150 mL/hr over 120 Minutes Intravenous  Once 02/17/22 1124 02/17/22 1330   02/17/22 0927  ceFAZolin (ANCEF) IVPB 2g/100 mL premix  Status:  Discontinued        2 g 200 mL/hr over 30 Minutes Intravenous 30 min pre-op 02/17/22 6237 02/17/22 1629        Patient was given sequential compression devices and early ambulation to prevent DVT.   Patient benefited maximally from hospital stay and there were no complications. At the time of discharge, the patient was urinating/moving their bowels without difficulty, tolerating a regular diet, pain is controlled with oral pain medications and they have been cleared by PT/OT.   Recent vital signs: Patient Vitals for the past 24 hrs:  BP Temp Temp src Pulse Resp SpO2 Height Weight  02/18/22 0724 126/71 97.9 F (36.6 C) Oral (!) 58 16 97 % -- --  02/18/22 0310 132/65 98.1 F (36.7 C) Oral (!) 58 20 96 % -- --  02/17/22 2340 125/67 98.5 F (36.9 C) Oral 66 20 95 % -- --  02/17/22 2156 -- -- -- 75 18 96 % -- --  02/17/22 2101 124/72 98.2 F (36.8 C) Oral 75 18 95 % -- --  02/17/22 1637 129/79 -- -- (!) 59 18 95 % -- --  02/17/22 1620 -- 98.8 F (37.1 C) -- 63 17 93 % -- --  02/17/22 1615 132/75 -- -- (!) 57 13 94 % -- --  02/17/22 1610 -- -- -- (!) 54 15 94 % -- --  02/17/22 1605 -- -- -- (!) 56 10 94 % -- --  02/17/22 1600 131/80 -- -- (!) 56 10 92 % -- --  02/17/22 1555 -- -- -- (!) 53 11 94 % -- --  02/17/22 1550 -- -- -- (!) 55 12 96 % -- --  02/17/22 1545 (!) 143/74 98.8 F (37.1 C) -- (!) 55 16 92 % -- --  02/17/22 1540 -- -- -- (!) 57 15 93 % -- --  02/17/22 1535 -- -- -- 60 19 93 % -- --  02/17/22 1530 133/78 -- -- 60 10 91 % -- --  02/17/22 1525 -- -- -- (!) 58 17 91 %  -- --  02/17/22 1520 -- -- -- 63 16 92 % -- --  02/17/22 1515 131/72 -- -- 68 18 93 % -- --  02/17/22 1510 -- -- -- 65 15 98 % -- --  02/17/22 1505 -- -- -- 68 19 98 % -- --  02/17/22 1500 135/80 98.7 F (37.1 C) -- 66 (!) 21 98 % -- --  02/17/22 0913 138/81 98.2 F (36.8 C) Oral 60 18 96 % '5\' 9"'$  (1.753 m) 118.6 kg     Recent laboratory studies: No results for input(s): "WBC", "HGB", "HCT", "PLT", "NA", "K", "CL", "CO2", "BUN", "CREATININE", "GLUCOSE", "INR", "CALCIUM" in the last 72 hours.  Invalid input(s): "PT", "2"   Discharge Medications:   Allergies as of 02/18/2022       Reactions   Penicillins Nausea And Vomiting   Mouth Ulcers - tolerated Augmentin 09/2012   Lisinopril Cough        Medication List     STOP taking these medications    glucose blood test strip   NON FORMULARY   triamcinolone cream 0.1 % Commonly known as: KENALOG       TAKE these medications    aspirin 81 MG tablet Take 81 mg by mouth daily.   atorvastatin 80 MG tablet Commonly known as: LIPITOR Take 1 tablet (80 mg total) by mouth daily. What changed: when to take this   carvedilol 6.25 MG tablet Commonly known as: COREG Take 1 tablet (6.25 mg total) by mouth 2 (two) times daily.   citalopram 20 MG tablet Commonly known as: CELEXA Take 1 tablet (20 mg total) by mouth daily.   lamoTRIgine 150 MG tablet Commonly known as: LAMICTAL Take 1 tablet (150 mg total) by mouth 2 (two) times daily. Take two tablets once a day. What changed: additional instructions   latanoprost 0.005 % ophthalmic solution Commonly known as: XALATAN Place 1 drop into both eyes nightly.   levothyroxine 112 MCG tablet Commonly known as: SYNTHROID Take 1 tablet (112 mcg total) by mouth daily.   methocarbamol 500 MG tablet Commonly known as: ROBAXIN Take 1 tablet (500 mg total) by mouth every 8 (eight) hours as needed for up to 5 days for muscle spasms.   nitroGLYCERIN 0.4 MG SL tablet Commonly known  as: NITROSTAT Place 1 tablet (0.4 mg total) under the tongue every 5 (five) minutes as needed for chest pain.   ondansetron 4 MG tablet Commonly known as: Zofran Take 1 tablet (4 mg total) by mouth every 8 (eight) hours as needed for nausea or vomiting.   oxyCODONE-acetaminophen  10-325 MG tablet Commonly known as: Percocet Take 1 tablet by mouth every 6 (six) hours as needed for up to 5 days for pain.   telmisartan 80 MG tablet Commonly known as: Micardis Take 1 tablet (80 mg total) by mouth daily.        Diagnostic Studies: DG Cervical Spine 2 or 3 views  Result Date: 02/17/2022 CLINICAL DATA:  Surgical cervical anterior fusion. EXAM: CERVICAL SPINE - 2-3 VIEW; DG C-ARM 1-60 MIN-NO REPORT Radiation exposure index: 13.98 mGy. COMPARISON:  None Available. FINDINGS: Five intraoperative fluoroscopic images were obtained of the cervical spine. These demonstrate the patient be status post surgical anterior fusion of C5-6 and C6-7. IMPRESSION: Fluoroscopic guidance during surgical anterior fusion of C5-6 and C6-7. Electronically Signed   By: Marijo Conception M.D.   On: 02/17/2022 14:48   DG C-Arm 1-60 Min-No Report  Result Date: 02/17/2022 CLINICAL DATA:  Surgical cervical anterior fusion. EXAM: CERVICAL SPINE - 2-3 VIEW; DG C-ARM 1-60 MIN-NO REPORT Radiation exposure index: 13.98 mGy. COMPARISON:  None Available. FINDINGS: Five intraoperative fluoroscopic images were obtained of the cervical spine. These demonstrate the patient be status post surgical anterior fusion of C5-6 and C6-7. IMPRESSION: Fluoroscopic guidance during surgical anterior fusion of C5-6 and C6-7. Electronically Signed   By: Marijo Conception M.D.   On: 02/17/2022 14:48   DG C-Arm 1-60 Min-No Report  Result Date: 02/17/2022 CLINICAL DATA:  Surgical cervical anterior fusion. EXAM: CERVICAL SPINE - 2-3 VIEW; DG C-ARM 1-60 MIN-NO REPORT Radiation exposure index: 13.98 mGy. COMPARISON:  None Available. FINDINGS: Five  intraoperative fluoroscopic images were obtained of the cervical spine. These demonstrate the patient be status post surgical anterior fusion of C5-6 and C6-7. IMPRESSION: Fluoroscopic guidance during surgical anterior fusion of C5-6 and C6-7. Electronically Signed   By: Marijo Conception M.D.   On: 02/17/2022 14:48   DG C-Arm 1-60 Min-No Report  Result Date: 02/17/2022 CLINICAL DATA:  Surgical cervical anterior fusion. EXAM: CERVICAL SPINE - 2-3 VIEW; DG C-ARM 1-60 MIN-NO REPORT Radiation exposure index: 13.98 mGy. COMPARISON:  None Available. FINDINGS: Five intraoperative fluoroscopic images were obtained of the cervical spine. These demonstrate the patient be status post surgical anterior fusion of C5-6 and C6-7. IMPRESSION: Fluoroscopic guidance during surgical anterior fusion of C5-6 and C6-7. Electronically Signed   By: Marijo Conception M.D.   On: 02/17/2022 14:48       Follow-up Information     Melina Schools, MD Follow up in 2 week(s).   Specialty: Orthopedic Surgery Why: If symptoms worsen, For suture removal, For wound re-check Contact information: [address] [place] [address] [phone]                 Discharge Plan:  discharge to home  Disposition: Patient is alert and oriented x3.  He has no shortness of breath or chest pain and the wound is clean dry and intact.  Patient is ambulating without any difficulty and his radicular arm pain is resolved.  At this point time he will be evaluated by PT and OT and then discharged to home.  Instructions and appropriate medications have been provided.  He will follow-up with me in 2 weeks.    Signed: Dahlia Bailiff for Dr. Melina Schools [facility] [phone] 02/18/2022, 7:59 AM

## 2022-02-18 NOTE — Progress Notes (Signed)
PT Cancellation Note and Discharge  Patient Details Name: Gerald Jordan MRN: [id number] DOB: 1960-07-11   Cancelled Treatment:    Reason Eval/Treat Not Completed: PT screened, no needs identified, will sign off. Discussed pt case with OT who reports pt is currently mobilizing at an independent level and does not require a formal PT evaluation at this time. PT signing off. If needs change, please reconsult.     Thelma Comp 02/18/2022, 9:09 AM  Rolinda Roan, PT, DPT Acute Rehabilitation Services Secure Chat Preferred Office: [phone]

## 2022-02-18 NOTE — Evaluation (Signed)
Occupational Therapy Evaluation Patient Details Name: Gerald Jordan MRN: [id number] DOB: 1960-10-21 Today's Date: 02/18/2022   History of Present Illness Mr. Viviano Bir is a 61 year old man s/p ACDF 5-7.   Clinical Impression   Patient presents s/p spinal surgery with hard collar and cervical precautions. Patient provided with education and instruction on spinal precautions and compensatory strategies for ADLs. Educated on cervical brace wear schedule.  Overall patient is independent level after instruction for ADLs.        Recommendations for follow up therapy are one component of a multi-disciplinary discharge planning process, led by the attending physician.  Recommendations may be updated based on patient status, additional functional criteria and insurance authorization.   Follow Up Recommendations  No OT follow up    Assistance Recommended at Discharge PRN  Patient can return home with the following      Functional Status Assessment  Patient has not had a recent decline in their functional status  Equipment Recommendations  None recommended by OT    Recommendations for Other Services       Precautions / Restrictions Precautions Precautions: Cervical Precaution Booklet Issued: Yes (comment) Required Braces or Orthoses: Cervical Brace Cervical Brace: Hard collar (allowed off in bed, to BR)      Mobility Bed Mobility Overal bed mobility: Independent                  Transfers Overall transfer level: Independent                        Balance Overall balance assessment: Independent                                         ADL either performed or assessed with clinical judgement   ADL Overall ADL's : Modified independent                                       General ADL Comments: Patient instructed on cervical precautions and handout provided. Overall patent able to perform ADLS with verbal instruction  on how to maitnain cervical precautions and how to compensate.     Vision Patient Visual Report: No change from baseline       Perception     Praxis      Pertinent Vitals/Pain Pain Assessment Pain Assessment: No/denies pain     Hand Dominance     Extremity/Trunk Assessment Upper Extremity Assessment Upper Extremity Assessment: Overall WFL for tasks assessed   Lower Extremity Assessment Lower Extremity Assessment: Overall WFL for tasks assessed   Cervical / Trunk Assessment Cervical / Trunk Assessment: Neck Surgery   Communication     Cognition Arousal/Alertness: Awake/alert Behavior During Therapy: WFL for tasks assessed/performed Overall Cognitive Status: Within Functional Limits for tasks assessed                                       General Comments       Exercises     Shoulder Instructions      Home Living Family/patient expects to be discharged to:: Private residence Living Arrangements: Spouse/significant other  Prior Functioning/Environment                          OT Problem List: Decreased knowledge of precautions      OT Treatment/Interventions:      OT Goals(Current goals can be found in the care plan section) Acute Rehab OT Goals OT Goal Formulation: All assessment and education complete, DC therapy  OT Frequency:      Co-evaluation              AM-PAC OT "6 Clicks" Daily Activity     Outcome Measure Help from another person eating meals?: None Help from another person taking care of personal grooming?: None Help from another person toileting, which includes using toliet, bedpan, or urinal?: None Help from another person bathing (including washing, rinsing, drying)?: None Help from another person to put on and taking off regular upper body clothing?: None Help from another person to put on and taking off regular lower body clothing?: None 6 Click  Score: 24   End of Session Equipment Utilized During Treatment: Cervical collar Nurse Communication: Mobility status (no PT needs)  Activity Tolerance: Patient tolerated treatment well Patient left: with family/visitor present  OT Visit Diagnosis: Pain                Time: 9774-1423 OT Time Calculation (min): 13 min Charges:  OT General Charges $OT Visit: 1 Visit OT Evaluation $OT Eval Low Complexity: 1 Low  Gustavo Lah, OTR/L [place]  Office [phone]   Lenward Chancellor 02/18/2022, 8:44 AM

## 2022-02-18 NOTE — Progress Notes (Signed)
Patient alert and oriented, mae's well, voiding adequate amount of urine, swallowing without difficulty, no c/o pain at time of discharge. Patient discharged home with family. Script and discharged instructions given to patient. Patient and family stated understanding of instructions given. Patient has an appointment with Dr. Brooks  

## 2022-02-21 ENCOUNTER — Encounter (HOSPITAL_COMMUNITY): Payer: Self-pay | Admitting: Orthopedic Surgery

## 2022-02-23 ENCOUNTER — Encounter: Payer: Self-pay | Admitting: Family Medicine

## 2022-02-28 ENCOUNTER — Other Ambulatory Visit: Payer: Self-pay | Admitting: Nurse Practitioner

## 2022-02-28 DIAGNOSIS — E1159 Type 2 diabetes mellitus with other circulatory complications: Secondary | ICD-10-CM

## 2022-02-28 DIAGNOSIS — I119 Hypertensive heart disease without heart failure: Secondary | ICD-10-CM

## 2022-02-28 DIAGNOSIS — I252 Old myocardial infarction: Secondary | ICD-10-CM

## 2022-03-22 ENCOUNTER — Ambulatory Visit: Payer: Managed Care, Other (non HMO) | Admitting: Nurse Practitioner

## 2022-03-22 ENCOUNTER — Encounter: Payer: Self-pay | Admitting: Nurse Practitioner

## 2022-03-22 VITALS — BP 128/74 | HR 65 | Temp 97.3°F | Ht 69.0 in | Wt 268.0 lb

## 2022-03-22 DIAGNOSIS — J069 Acute upper respiratory infection, unspecified: Secondary | ICD-10-CM | POA: Diagnosis not present

## 2022-03-22 MED ORDER — AZITHROMYCIN 250 MG PO TABS: ORAL_TABLET | ORAL | 0 refills | Status: AC

## 2022-03-22 NOTE — Progress Notes (Signed)
Acute Office Visit  Subjective:    Patient ID: Gerald Jordan, male    DOB: [birth year], 61 y.o.   MRN: [id number]  Chief Complaint  Patient presents with   URI    HPI: Patient is in today for Upper respiratory symptoms He complains of chest congestion and non-productive cough. Denies fever, chills, night sweats or weight loss. Onset of symptoms was a few days ago and staying constant.He is drinking plenty of fluids.  Past history is significant for occasional episodes of bronchitis and pneumonia. Patient is non-smoker. He recently had general anesthesia for an anterior cervical fusion. Incentive spirometer used post-op for 1 week.   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   Type 2 diabetes mellitus (HCC)    Unspecified hearing loss, bilateral    Ventricular fibrillation [facility])     Past Surgical History:  Procedure Laterality Date   ANTERIOR CERVICAL DECOMP/DISCECTOMY FUSION N/A 02/17/2022   Procedure: ANTERIOR CERVICAL DECOMPRESSION/DISCECTOMY FUSION  CERVICAL FIVE-SEVEN;  Surgeon: Venita Lick, MD;  Location: [facility] OR;  Service: Orthopedics;  Laterality: N/A;   arm surgery  05/2014   CARDIAC CATHETERIZATION  2019   one stent placed   CHOLECYSTECTOMY  02/2016   COLONOSCOPY  2022   HERNIA REPAIR     1999-umbilical   NASAL SINUS SURGERY  2000    tendon repair 11/2014      Family History  Problem Relation Age of Onset   Breast cancer Mother    Melanoma Father    Diabetes Sister    Arrhythmia Brother    Congestive Heart Failure Brother    Heart attack Brother    Atrial fibrillation Brother     Social History   Socioeconomic History   Marital status: Married    Spouse name: Not on file   Number of children: 1   Years of education: Not on file   Highest education level: Not on file  Occupational History   Occupation: Production designer, theatre/television/film  Tobacco Use   Smoking status: Never   Smokeless tobacco: Never  Vaping Use   Vaping Use: Never used  Substance and Sexual Activity   Alcohol use: Not Currently   Drug use: No   Sexual activity: Yes    Partners: Female  Other Topics Concern   Not on file  Social History Narrative   Not on file   Social Determinants of Health   Financial Resource Strain: Not on file  Food Insecurity: Not on file  Transportation Needs: Not on file  Physical Activity: Not on file  Stress: Not on file  Social Connections: Not on file  Intimate Partner Violence: Not on file    Outpatient Medications Prior to Visit  Medication Sig Dispense Refill   aspirin 81 MG tablet Take 81  mg by mouth daily.     atorvastatin (LIPITOR) 80 MG tablet Take 1 tablet (80 mg total) by mouth daily. (Patient taking differently: Take 80 mg by mouth every evening.) 90 tablet 3   carvedilol (COREG) 6.25 MG tablet Take 1 tablet (6.25 mg total) by mouth 2 (two) times daily. 14 tablet 0   citalopram (CELEXA) 20 MG tablet Take 1 tablet (20 mg total) by mouth daily. 90 tablet 3   lamoTRIgine (LAMICTAL) 150 MG tablet Take 1 tablet (150 mg total) by mouth 2 (two) times daily. Take two tablets once a day. (Patient taking differently: Take 150 mg by mouth 2 (two) times daily.) 180 tablet 3   latanoprost (XALATAN) 0.005 % ophthalmic solution Place 1 drop into both eyes nightly. 2.5 mL 2   levothyroxine (SYNTHROID) 112 MCG tablet Take 1  tablet (112 mcg total) by mouth daily. 90 tablet 3   nitroGLYCERIN (NITROSTAT) 0.4 MG SL tablet Place 1 tablet (0.4 mg total) under the tongue every 5 (five) minutes as needed for chest pain. 25 tablet 0   ondansetron (ZOFRAN) 4 MG tablet Take 1 tablet (4 mg total) by mouth every 8 (eight) hours as needed for nausea or vomiting. 20 tablet 0   telmisartan (MICARDIS) 80 MG tablet TAKE 1 TABLET DAILY 90 tablet 3   No facility-administered medications prior to visit.    Allergies  Allergen Reactions   Penicillins Nausea And Vomiting    Mouth Ulcers - tolerated Augmentin 09/2012     Lisinopril Cough    Review of Systems See pertinent positives and negatives per HPI.     Objective:    Physical Exam Vitals reviewed.  Constitutional:      Appearance: Normal appearance.  HENT:     Right Ear: Tympanic membrane normal.     Left Ear: Tympanic membrane normal.     Mouth/Throat:     Mouth: Mucous membranes are moist.     Pharynx: Posterior oropharyngeal erythema present.  Cardiovascular:     Rate and Rhythm: Bradycardia present.     Pulses: Normal pulses.     Heart sounds: Normal heart sounds.  Pulmonary:     Effort: Pulmonary effort is normal.     Comments: Lung sounds diminished to bilateral lower posterior lobes Skin:    General: Skin is warm and dry.     Capillary Refill: Capillary refill takes less than 2 seconds.  Neurological:     Mental Status: He is alert.     BP 128/74   Pulse 65   Temp (!) 97.3 F (36.3 C)   Ht 5\' 9"  (1.753 m)   Wt 268 lb (121.6 kg)   SpO2 98%   BMI 39.58 kg/m   Wt Readings from Last 3 Encounters:  03/22/22 268 lb (121.6 kg)  02/17/22 261 lb 6.4 oz (118.6 kg)  02/11/22 261 lb 6.4 oz (118.6 kg)    Health Maintenance Due  Topic Date Due   OPHTHALMOLOGY EXAM  03/10/2022    Lab Results  Component Value Date   TSH 2.610 06/10/2021   Lab Results  Component Value Date   WBC 7.6 02/11/2022   HGB 14.6 02/11/2022   HCT 42.1 02/11/2022    MCV 91.5 02/11/2022   PLT 184 02/11/2022   Lab Results  Component Value Date   NA 140 02/11/2022   K 4.9 02/11/2022   CO2 30 02/11/2022   GLUCOSE 108 (H) 02/11/2022   BUN 9 02/11/2022   CREATININE 0.80 02/11/2022  BILITOT 0.8 02/11/2022   ALKPHOS 103 02/11/2022   AST 29 02/11/2022   ALT 35 02/11/2022   PROT 6.3 (L) 02/11/2022   ALBUMIN 3.8 02/11/2022   CALCIUM 9.0 02/11/2022   ANIONGAP 7 02/11/2022   EGFR 100 12/20/2021   Lab Results  Component Value Date   CHOL 91 (L) 12/20/2021   Lab Results  Component Value Date   HDL 38 (L) 12/20/2021   Lab Results  Component Value Date   LDLCALC 39 12/20/2021   Lab Results  Component Value Date   TRIG 60 12/20/2021   Lab Results  Component Value Date   CHOLHDL 2.4 12/20/2021   Lab Results  Component Value Date   HGBA1C 5.5 12/20/2021       Assessment & Plan:   1. Upper respiratory tract infection, unspecified type - azithromycin (ZITHROMAX) 250 MG tablet; Take 2 tablets on day 1, then 1 tablet daily on days 2 through 5  Dispense: 6 tablet; Refill: 0 -Take Mucinex every 12 hours as directed -rest and push fluids -deep breathe and cough -follow-up as needed    Follow-up: PRN  An After Visit Summary was printed and given to the patient.  I, Rip Harbour, NP, have reviewed all documentation for this visit. The documentation on 03/22/22 for the exam, diagnosis, procedures, and orders are all accurate and complete.   Signed, Rip Harbour, NP [place] [phone]

## 2022-06-12 NOTE — Progress Notes (Signed)
Subjective:  Patient ID: Gerald Jordan, male    DOB: 19-Oct-1960  Age: 62 y.o. MRN: [id number]  Chief Complaint  Patient presents with   Diabetes   Hypothyroidism   Hyperlipidemia    HPI  Diabetes:  Complications: CAD Glucose checking: once in a while.  Glucose logs:110-130. Hypoglycemia:no Most recent A1C:5.5 Current medications: none Last Eye Exam:03/10/2021 Foot checks: daily  Hyperlipidemia: Current medications: lipitor 80 mg once daily  Hypertension: Complications: Current medications:telmisartan 80 mg once daily, coreg 6.25 mg one twice daily.   Diet: healthy. Previously on golo and did well the first 6 months of 2023. He lost 20 lbs, but has gone off and gained weight back. Exercise: very active  Hypothyroidism: on synthroid 112 mcg once daily in am.   Bipolar: lamictal 150 mg twice daily.    Went to ED twice over new years for dehydration due to diarrhea. No vomiting. Dx: Sapovirus. Feeling better.   Current Outpatient Medications on File Prior to Visit  Medication Sig Dispense Refill   aspirin 81 MG tablet Take 81 mg by mouth daily.     atorvastatin (LIPITOR) 80 MG tablet Take 1 tablet (80 mg total) by mouth daily. (Patient taking differently: Take 80 mg by mouth every evening.) 90 tablet 3   latanoprost (XALATAN) 0.005 % ophthalmic solution Place 1 drop into both eyes nightly. 2.5 mL 2   nitroGLYCERIN (NITROSTAT) 0.4 MG SL tablet Place 1 tablet (0.4 mg total) under the tongue every 5 (five) minutes as needed for chest pain. 25 tablet 0   ondansetron (ZOFRAN) 4 MG tablet Take 1 tablet (4 mg total) by mouth every 8 (eight) hours as needed for nausea or vomiting. 20 tablet 0   telmisartan (MICARDIS) 80 MG tablet TAKE 1 TABLET DAILY 90 tablet 3   No current facility-administered medications on file prior to visit.   Past Medical History:  Diagnosis Date   Anxiety    Atherosclerotic heart disease of native coronary artery without angina pectoris 2019    Ventricular fibrillation - cardioverted x 3 in ambulance. 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   Type 2 diabetes mellitus (HCC)    Unspecified hearing loss, bilateral    Ventricular fibrillation [facility])    Past Surgical History:  Procedure Laterality Date   ANTERIOR CERVICAL DECOMP/DISCECTOMY FUSION N/A 02/17/2022   Procedure: ANTERIOR CERVICAL DECOMPRESSION/DISCECTOMY FUSION  CERVICAL FIVE-SEVEN;  Surgeon: Melina Schools, MD;  Location: [place];  Service: Orthopedics;  Laterality: N/A;   arm surgery  05/2014   CARDIAC CATHETERIZATION  2019   one stent placed   CHOLECYSTECTOMY  02/2016   COLONOSCOPY  2022   HERNIA REPAIR     1999-umbilical   NASAL SINUS SURGERY  2000   tendon repair 11/2014      Family History  Problem Relation Age of Onset   Breast cancer Mother    Melanoma Father    Diabetes Sister    Arrhythmia Brother  Congestive Heart Failure Brother    Heart attack Brother    Atrial fibrillation Brother    Social History   Socioeconomic History   Marital status: Married    Spouse name: Not on file   Number of children: 1   Years of education: Not on file   Highest education level: Not on file  Occupational History   Occupation: Freight forwarder  Tobacco Use   Smoking status: Never   Smokeless tobacco: Never  Vaping Use   Vaping Use: Never used  Substance and Sexual Activity   Alcohol use: Not Currently   Drug use: No   Sexual activity: Yes    Partners: Female  Other Topics Concern   Not on file  Social History Narrative   Not on  file   Social Determinants of Health   Financial Resource Strain: Not on file  Food Insecurity: Not on file  Transportation Needs: Not on file  Physical Activity: Not on file  Stress: Not on file  Social Connections: Not on file    Review of Systems  Constitutional:  Negative for chills and fever.  HENT:  Negative for congestion.   Respiratory:  Negative for cough and shortness of breath.   Cardiovascular:  Negative for chest pain and palpitations.  Gastrointestinal:  Negative for abdominal pain and nausea.  Genitourinary:  Negative for dysuria.  Musculoskeletal:  Positive for neck pain.  Neurological:  Positive for headaches.  Psychiatric/Behavioral:  Negative for dysphoric mood. The patient is not nervous/anxious.      Objective:  BP 130/70   Pulse 60   Temp (!) 97.4 F (36.3 C)   Resp 18   Ht '5\' 9"'$  (1.753 m)   Wt 273 lb (123.8 kg)   BMI 40.32 kg/m      06/13/2022    8:10 AM 03/22/2022    2:58 PM 02/18/2022    7:24 AM  BP/Weight  Systolic BP 606 301 601  Diastolic BP 70 74 71  Wt. (Lbs) 273 268   BMI 40.32 kg/m2 39.58 kg/m2     Physical Exam Vitals reviewed.  Constitutional:      Appearance: Normal appearance.  Neck:     Vascular: No carotid bruit.  Cardiovascular:     Rate and Rhythm: Normal rate and regular rhythm.     Heart sounds: Normal heart sounds.  Pulmonary:     Effort: Pulmonary effort is normal.     Breath sounds: Normal breath sounds. No wheezing, rhonchi or rales.  Abdominal:     General: Bowel sounds are normal.     Palpations: Abdomen is soft.     Tenderness: There is no abdominal tenderness.  Neurological:     Mental Status: He is alert and oriented to person, place, and time.  Psychiatric:        Mood and Affect: Mood normal.        Behavior: Behavior normal.     Diabetic Foot Exam - Simple   Simple Foot Form Diabetic Foot exam was performed with the following findings: Yes 06/13/2022  8:33 AM  Visual Inspection No  deformities, no ulcerations, no other skin breakdown bilaterally: Yes Sensation Testing Intact to touch and monofilament testing bilaterally: Yes Pulse Check Posterior Tibialis and Dorsalis pulse intact bilaterally: Yes Comments      Lab Results  Component Value Date   WBC 6.9 06/13/2022   HGB 14.7 06/13/2022   HCT 41.9 06/13/2022   PLT 188 06/13/2022   GLUCOSE 118 (H) 06/13/2022  CHOL 105 06/13/2022   TRIG 88 06/13/2022   HDL 37 (L) 06/13/2022   LDLCALC 51 06/13/2022   ALT 44 06/13/2022   AST 30 06/13/2022   NA 139 06/13/2022   K 4.2 06/13/2022   CL 102 06/13/2022   CREATININE 0.86 06/13/2022   BUN 9 06/13/2022   CO2 25 06/13/2022   TSH 5.030 (H) 06/13/2022   HGBA1C 6.1 (H) 06/13/2022   MICROALBUR 30 07/29/2020      Assessment & Plan:   Marcial was seen today for diabetes, hypothyroidism and hyperlipidemia.  Hypertensive heart disease without heart failure Assessment & Plan: Well controlled.  No changes to medicines. Telmisartan 80 mg once daily, coreg 6.25 mg one twice daily.  Continue to work on eating a healthy diet and exercise.  Labs drawn today.    Orders: -     Comprehensive metabolic panel -     CBC with Differential/Platelet  Coronary artery disease of native artery of native heart with stable angina pectoris [facility]) Assessment & Plan: The current medical regimen is effective;  continue present plan and medications. Taking Lipitor 80 mg daily.    OSA on CPAP Assessment & Plan: Using CPAP.   Secondary hypothyroidism Assessment & Plan: Previously well controlled Continue Synthroid at current dose  Recheck TSH and adjust Synthroid as indicated    Orders: -     TSH  Diabetic glomerulopathy ([place]) Assessment & Plan: Control: controlled Recommend check sugars fasting daily. Recommend check feet daily. Recommend annual eye exams. Medicines: Consider Ozempic or rybelsus. Continue to work on eating a healthy diet and exercise.  Labs drawn  today.     Orders: -     Hemoglobin A1c -     Microalbumin / creatinine urine ratio  Mixed hyperlipidemia Assessment & Plan: Well controlled.  No changes to medicines. lipitor 80 mg once daily  Continue to work on eating a healthy diet and exercise.  Labs drawn today.    Orders: -     Lipid panel  Mild recurrent major depression ([place]) Assessment & Plan: The current medical regimen is effective;  continue present plan and medications.    Other orders -     Cardiovascular Risk Assessment     No orders of the defined types were placed in this encounter.   Orders Placed This Encounter  Procedures   Comprehensive metabolic panel   Hemoglobin A1c   Lipid panel   CBC with Differential/Platelet   TSH   Microalbumin / creatinine urine ratio   Cardiovascular Risk Assessment     Follow-up: Return in about 3 months (around 09/12/2022) for chronic fasting.  An After Visit Summary was printed and given to the patient.  I,Takia Runyon,acting as a Education administrator for Rochel Brome, MD.,have documented all relevant documentation on the behalf of Rochel Brome, MD,as directed by  Rochel Brome, MD while in the presence of Rochel Brome, MD.    Rochel Brome, MD [place] [phone]

## 2022-06-13 ENCOUNTER — Ambulatory Visit: Payer: Managed Care, Other (non HMO) | Admitting: Family Medicine

## 2022-06-13 ENCOUNTER — Encounter: Payer: Self-pay | Admitting: Family Medicine

## 2022-06-13 VITALS — BP 130/70 | HR 60 | Temp 97.4°F | Resp 18 | Ht 69.0 in | Wt 273.0 lb

## 2022-06-13 DIAGNOSIS — E1121 Type 2 diabetes mellitus with diabetic nephropathy: Secondary | ICD-10-CM

## 2022-06-13 DIAGNOSIS — E782 Mixed hyperlipidemia: Secondary | ICD-10-CM

## 2022-06-13 DIAGNOSIS — I119 Hypertensive heart disease without heart failure: Secondary | ICD-10-CM

## 2022-06-13 DIAGNOSIS — G4733 Obstructive sleep apnea (adult) (pediatric): Secondary | ICD-10-CM | POA: Diagnosis not present

## 2022-06-13 DIAGNOSIS — I25118 Atherosclerotic heart disease of native coronary artery with other forms of angina pectoris: Secondary | ICD-10-CM

## 2022-06-13 DIAGNOSIS — E038 Other specified hypothyroidism: Secondary | ICD-10-CM | POA: Diagnosis not present

## 2022-06-13 DIAGNOSIS — F330 Major depressive disorder, recurrent, mild: Secondary | ICD-10-CM

## 2022-06-13 NOTE — Patient Instructions (Addendum)
Consider Ozempic or rybelsus.

## 2022-06-14 LAB — COMPREHENSIVE METABOLIC PANEL
ALT: 44 IU/L (ref 0–44)
AST: 30 IU/L (ref 0–40)
Albumin/Globulin Ratio: 2.3 — ABNORMAL HIGH (ref 1.2–2.2)
Albumin: 4.3 g/dL (ref 3.9–4.9)
Alkaline Phosphatase: 98 IU/L (ref 44–121)
BUN/Creatinine Ratio: 10 (ref 10–24)
BUN: 9 mg/dL (ref 8–27)
Bilirubin Total: 0.7 mg/dL (ref 0.0–1.2)
CO2: 25 mmol/L (ref 20–29)
Calcium: 9 mg/dL (ref 8.6–10.2)
Chloride: 102 mmol/L (ref 96–106)
Creatinine, Ser: 0.86 mg/dL (ref 0.76–1.27)
Globulin, Total: 1.9 g/dL (ref 1.5–4.5)
Glucose: 118 mg/dL — ABNORMAL HIGH (ref 70–99)
Potassium: 4.2 mmol/L (ref 3.5–5.2)
Sodium: 139 mmol/L (ref 134–144)
Total Protein: 6.2 g/dL (ref 6.0–8.5)
eGFR: 99 mL/min/{1.73_m2} (ref >59)

## 2022-06-14 LAB — CBC WITH DIFFERENTIAL/PLATELET
Basophils Absolute: 0 10*3/uL (ref 0.0–0.2)
Basos: 0 %
EOS (ABSOLUTE): 0.1 10*3/uL (ref 0.0–0.4)
Eos: 1 %
Hematocrit: 41.9 % (ref 37.5–51.0)
Hemoglobin: 14.7 g/dL (ref 13.0–17.7)
Immature Grans (Abs): 0 10*3/uL (ref 0.0–0.1)
Immature Granulocytes: 0 %
Lymphocytes Absolute: 2.1 10*3/uL (ref 0.7–3.1)
Lymphs: 31 %
MCH: 31.7 pg (ref 26.6–33.0)
MCHC: 35.1 g/dL (ref 31.5–35.7)
MCV: 91 fL (ref 79–97)
Monocytes Absolute: 0.8 10*3/uL (ref 0.1–0.9)
Monocytes: 11 %
Neutrophils Absolute: 3.9 10*3/uL (ref 1.4–7.0)
Neutrophils: 57 %
Platelets: 188 10*3/uL (ref 150–450)
RBC: 4.63 x10E6/uL (ref 4.14–5.80)
RDW: 12.4 % (ref 11.6–15.4)
WBC: 6.9 10*3/uL (ref 3.4–10.8)

## 2022-06-14 LAB — MICROALBUMIN / CREATININE URINE RATIO
Creatinine, Urine: 191.2 mg/dL
Microalb/Creat Ratio: 6 mg/g creat (ref 0–29)
Microalbumin, Urine: 11.2 ug/mL

## 2022-06-14 LAB — HEMOGLOBIN A1C
Est. average glucose Bld gHb Est-mCnc: 128 mg/dL
Hgb A1c MFr Bld: 6.1 % — ABNORMAL HIGH (ref 4.8–5.6)

## 2022-06-14 LAB — LIPID PANEL
Chol/HDL Ratio: 2.8 ratio (ref 0.0–5.0)
Cholesterol, Total: 105 mg/dL (ref 100–199)
HDL: 37 mg/dL — ABNORMAL LOW (ref >39)
LDL Chol Calc (NIH): 51 mg/dL (ref 0–99)
Triglycerides: 88 mg/dL (ref 0–149)
VLDL Cholesterol Cal: 17 mg/dL (ref 5–40)

## 2022-06-14 LAB — TSH: TSH: 5.03 u[IU]/mL — ABNORMAL HIGH (ref 0.450–4.500)

## 2022-06-14 LAB — CARDIOVASCULAR RISK ASSESSMENT

## 2022-06-15 NOTE — Progress Notes (Signed)
Blood count normal.  Liver function normal.  Kidney function normal.  Thyroid function abnormal. Increase levothyroxine to 125 mcg once daily in am. Cholesterol: good HBA1C: 6.1. worsened. I would recommend work on decrease sugar and carbs as well as increase exercise.  Not spilling protein in urine

## 2022-06-16 ENCOUNTER — Other Ambulatory Visit: Payer: Self-pay

## 2022-06-16 DIAGNOSIS — E038 Other specified hypothyroidism: Secondary | ICD-10-CM

## 2022-06-16 MED ORDER — LEVOTHYROXINE SODIUM 125 MCG PO TABS: 125.0000 ug | ORAL_TABLET | Freq: Every day | ORAL | 0 refills | Status: DC

## 2022-06-18 ENCOUNTER — Other Ambulatory Visit: Payer: Self-pay | Admitting: Nurse Practitioner

## 2022-06-18 DIAGNOSIS — I25118 Atherosclerotic heart disease of native coronary artery with other forms of angina pectoris: Secondary | ICD-10-CM

## 2022-06-18 DIAGNOSIS — F330 Major depressive disorder, recurrent, mild: Secondary | ICD-10-CM

## 2022-06-18 NOTE — Assessment & Plan Note (Signed)
Control: controlled Recommend check sugars fasting daily. Recommend check feet daily. Recommend annual eye exams. Medicines: Consider Ozempic or rybelsus. Continue to work on eating a healthy diet and exercise.  Labs drawn today.

## 2022-06-18 NOTE — Assessment & Plan Note (Signed)
Using CPAP 

## 2022-06-18 NOTE — Assessment & Plan Note (Signed)
Well controlled.  No changes to medicines. Telmisartan 80 mg once daily, coreg 6.25 mg one twice daily.  Continue to work on eating a healthy diet and exercise.  Labs drawn today.

## 2022-06-18 NOTE — Assessment & Plan Note (Signed)
The current medical regimen is effective;  continue present plan and medications. Taking Lipitor 80 mg daily.

## 2022-06-18 NOTE — Assessment & Plan Note (Signed)
Well controlled.  No changes to medicines. lipitor 80 mg once daily  Continue to work on eating a healthy diet and exercise.  Labs drawn today.

## 2022-06-18 NOTE — Assessment & Plan Note (Signed)
The current medical regimen is effective;  continue present plan and medications.  

## 2022-06-18 NOTE — Assessment & Plan Note (Signed)
Previously well controlled Continue Synthroid at current dose  Recheck TSH and adjust Synthroid as indicated   

## 2022-06-20 ENCOUNTER — Other Ambulatory Visit: Payer: Self-pay

## 2022-06-20 ENCOUNTER — Other Ambulatory Visit: Payer: Self-pay | Admitting: Family Medicine

## 2022-06-20 ENCOUNTER — Telehealth: Payer: Self-pay

## 2022-06-20 DIAGNOSIS — E782 Mixed hyperlipidemia: Secondary | ICD-10-CM

## 2022-06-20 MED ORDER — OZEMPIC (0.25 OR 0.5 MG/DOSE) 2 MG/3ML ~~LOC~~ SOPN: PEN_INJECTOR | SUBCUTANEOUS | 0 refills | Status: DC

## 2022-06-20 MED ORDER — ATORVASTATIN CALCIUM 80 MG PO TABS: 80.0000 mg | ORAL_TABLET | Freq: Every day | ORAL | 3 refills | Status: DC

## 2022-06-20 NOTE — Telephone Encounter (Signed)
Patient called and stated that he will be okay with trying the ozepmic and would like for [facility]

## 2022-06-21 NOTE — Telephone Encounter (Signed)
Patient Made Aware, Verbalized Understanding.  

## 2022-09-08 ENCOUNTER — Telehealth: Payer: Self-pay

## 2022-09-08 NOTE — Telephone Encounter (Signed)
Patient called and stated that he is having some burning in the lower abd, constipation, and burping with a nasty taste in his mouth, and he believes it is coming from starting ozempic. He takes his next shot Saturday and wanted to know if he should take it or wait because he have an appointment with provider on the 24th. Please advise

## 2022-09-09 NOTE — Telephone Encounter (Signed)
Called patient left a detailed message for patient to call office with any questions.

## 2022-09-15 ENCOUNTER — Other Ambulatory Visit: Payer: Self-pay

## 2022-09-15 DIAGNOSIS — I119 Hypertensive heart disease without heart failure: Secondary | ICD-10-CM

## 2022-09-15 DIAGNOSIS — E1121 Type 2 diabetes mellitus with diabetic nephropathy: Secondary | ICD-10-CM

## 2022-09-15 DIAGNOSIS — E782 Mixed hyperlipidemia: Secondary | ICD-10-CM

## 2022-09-15 DIAGNOSIS — E038 Other specified hypothyroidism: Secondary | ICD-10-CM

## 2022-09-16 ENCOUNTER — Ambulatory Visit: Payer: Managed Care, Other (non HMO) | Admitting: Family Medicine

## 2022-09-16 VITALS — BP 120/64 | HR 64 | Temp 97.2°F | Resp 18 | Ht 69.0 in | Wt 264.0 lb

## 2022-09-16 DIAGNOSIS — E1121 Type 2 diabetes mellitus with diabetic nephropathy: Secondary | ICD-10-CM | POA: Diagnosis not present

## 2022-09-16 DIAGNOSIS — E782 Mixed hyperlipidemia: Secondary | ICD-10-CM

## 2022-09-16 DIAGNOSIS — N3000 Acute cystitis without hematuria: Secondary | ICD-10-CM

## 2022-09-16 DIAGNOSIS — I119 Hypertensive heart disease without heart failure: Secondary | ICD-10-CM | POA: Diagnosis not present

## 2022-09-16 DIAGNOSIS — E038 Other specified hypothyroidism: Secondary | ICD-10-CM | POA: Diagnosis not present

## 2022-09-16 LAB — POCT URINALYSIS DIP (CLINITEK)
Bilirubin, UA: NEGATIVE
Blood, UA: NEGATIVE
Glucose, UA: NEGATIVE mg/dL
Ketones, POC UA: NEGATIVE mg/dL
Nitrite, UA: NEGATIVE
POC PROTEIN,UA: NEGATIVE
Spec Grav, UA: 1.025 (ref 1.010–1.025)
Urobilinogen, UA: 0.2 E.U./dL
pH, UA: 6 (ref 5.0–8.0)

## 2022-09-16 MED ORDER — ZEPBOUND 2.5 MG/0.5ML ~~LOC~~ SOAJ
2.5000 mg | SUBCUTANEOUS | 0 refills | Status: DC
Start: 2022-09-16 — End: 2022-11-22

## 2022-09-16 MED ORDER — CIPROFLOXACIN HCL 250 MG PO TABS
250.0000 mg | ORAL_TABLET | Freq: Two times a day (BID) | ORAL | 0 refills | Status: AC
Start: 2022-09-16 — End: 2022-09-19

## 2022-09-16 NOTE — Assessment & Plan Note (Signed)
Continue lipitor 80 mg daily. Heart healthy diet. Labs drawn.

## 2022-09-16 NOTE — Progress Notes (Unsigned)
Subjective:  Patient ID: Gerald Jordan, male    DOB: 1961-01-01  Age: 62 y.o. MRN: [id number]  Chief Complaint  Patient presents with   Hyperlipidemia   Hypothyroidism    HPI Diabetes:  Complications: CAD Glucose checking: once in a while.  Glucose logs: 115-130. Hypoglycemia:no Most recent A1C: 6.1 Current medications: Patient did not tolerate the ozempic because of nausea and epigastric pain and belching. Last Eye Exam:03/10/2021 Foot checks: daily  Hyperlipidemia: Current medications: lipitor 80 mg once daily  Hypertension: Current medications:telmisartan 80 mg once daily, coreg 6.25 mg one twice daily.   Diet: healthy. Previously on golo and did well the first 6 months of 2023. He lost 20 lbs, but has gone off and gained weight back. Exercise: very active  Hypothyroidism: on synthroid 125 mcg once daily in am.   Bipolar: lamictal 150 mg twice daily and on citalopram 20 mg daily. Marland Kitchen  Has been on for years. Well controlled.   Patient has been having some dysuria.      12/09/2021    8:38 AM 06/10/2021    9:24 AM 07/27/2020    2:17 PM 08/05/2019   12:57 PM  Depression screen PHQ 2/9  Decreased Interest 0 0 0 0  Down, Depressed, Hopeless 0 0 0 0  PHQ - 2 Score 0 0 0 0  Altered sleeping 0     Tired, decreased energy 0     Change in appetite 0     Feeling bad or failure about yourself  0     Trouble concentrating 0     Moving slowly or fidgety/restless 0     Suicidal thoughts 0     PHQ-9 Score 0     Difficult doing work/chores Not difficult at all            08/05/2019   12:57 PM 07/27/2020    2:17 PM 06/10/2021    9:23 AM 02/17/2022    9:38 AM 02/17/2022    7:35 PM  Fall Risk  Falls in the past year? 0 0 0    Was there an injury with Fall? 0 0 0    Fall Risk Category Calculator 0 0 0    Fall Risk Category (Retired) Low Low Low    (RETIRED) Patient Fall Risk Level Low fall risk  Low fall risk Moderate fall risk Moderate fall risk  Patient at Risk for Falls  Due to  No Fall Risks No Fall Risks    Fall risk Follow up  Falls evaluation completed Falls evaluation completed        Review of Systems  Constitutional:  Negative for chills and fever.  HENT:  Negative for congestion, rhinorrhea and sore throat.   Respiratory:  Negative for cough and shortness of breath.   Cardiovascular:  Negative for chest pain and palpitations.  Gastrointestinal:  Positive for constipation. Negative for abdominal pain, diarrhea, nausea and vomiting.  Genitourinary:  Positive for dysuria. Negative for urgency.  Musculoskeletal:  Negative for arthralgias, back pain and myalgias.  Neurological:  Positive for light-headedness. Negative for dizziness and headaches.  Psychiatric/Behavioral:  Negative for dysphoric mood. The patient is not nervous/anxious.     Current Outpatient Medications on File Prior to Visit  Medication Sig Dispense Refill   aspirin 81 MG tablet Take 81 mg by mouth daily.     atorvastatin (LIPITOR) 80 MG tablet Take 1 tablet (80 mg total) by mouth daily. 90 tablet 3   carvedilol (COREG) 6.25 MG tablet  TAKE 1 TABLET TWICE A DAY 180 tablet 3   citalopram (CELEXA) 20 MG tablet TAKE 1 TABLET DAILY 90 tablet 3   lamoTRIgine (LAMICTAL) 150 MG tablet TAKE 2 TABLETS DAILY AS DIRECTED BY YOUR PRESCRIBER 180 tablet 3   latanoprost (XALATAN) 0.005 % ophthalmic solution Place 1 drop into both eyes nightly. 2.5 mL 2   levothyroxine (SYNTHROID) 125 MCG tablet Take 1 tablet (125 mcg total) by mouth daily. 90 tablet 0   nitroGLYCERIN (NITROSTAT) 0.4 MG SL tablet Place 1 tablet (0.4 mg total) under the tongue every 5 (five) minutes as needed for chest pain. 25 tablet 0   telmisartan (MICARDIS) 80 MG tablet TAKE 1 TABLET DAILY 90 tablet 3   No current facility-administered medications on file prior to visit.   Past Medical History:  Diagnosis Date   Anxiety    Atherosclerotic heart disease of native coronary artery without angina pectoris 2019   Ventricular  fibrillation - cardioverted x 3 in ambulance. 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   Type 2 diabetes mellitus    Unspecified hearing loss, bilateral    Ventricular fibrillation    Past Surgical History:  Procedure Laterality Date   ANTERIOR CERVICAL DECOMP/DISCECTOMY FUSION N/A 02/17/2022   Procedure: ANTERIOR CERVICAL DECOMPRESSION/DISCECTOMY FUSION  CERVICAL FIVE-SEVEN;  Surgeon: Venita Lick, MD;  Location: [facility];  Service: Orthopedics;  Laterality: N/A;   arm surgery  05/2014   CARDIAC CATHETERIZATION  2019   one stent placed   CHOLECYSTECTOMY  02/2016   COLONOSCOPY  2022   HERNIA REPAIR     1999-umbilical   NASAL SINUS SURGERY  2000   tendon repair 11/2014      Family History  Problem Relation Age of Onset   Breast cancer Mother    Melanoma Father    Diabetes Sister    Arrhythmia Brother    Congestive Heart Failure Brother    Heart attack Brother    Atrial fibrillation Brother    Social History   Socioeconomic History   Marital status: Married    Spouse name: Not on file   Number of children: 1   Years of education: Not on file   Highest education level: Not on file  Occupational History   Occupation: Production designer, theatre/television/film  Tobacco Use   Smoking status: Never   Smokeless tobacco: Never  Vaping Use   Vaping Use: Never used  Substance and Sexual Activity   Alcohol use: Not Currently   Drug use: No   Sexual activity: Yes    Partners: Female  Other Topics Concern   Not on file  Social History Narrative   Not on file   Social Determinants of Health    Financial Resource Strain: Not on file  Food Insecurity: Not on file  Transportation Needs: Not on file  Physical Activity: Not on file  Stress: Not on file  Social Connections: Not on file    Objective:  BP 120/64   Pulse 64  Temp (!) 97.2 F (36.2 C)   Resp 18   Ht  (1.753 m)   Wt 264 lb (119.7 kg)   BMI 38.99 kg/m      09/16/2022    8:22 AM 06/13/2022    8:10 AM 03/22/2022    2:58 PM  BP/Weight  Systolic BP 120 130 128  Diastolic BP 64 70 74  Wt. (Lbs) 264 273 268  BMI 38.99 kg/m2 40.32 kg/m2 39.58 kg/m2    Physical Exam Constitutional:      Appearance: Normal appearance. He is obese.  Cardiovascular:     Rate and Rhythm: Normal rate and regular rhythm.     Heart sounds: Normal heart sounds.  Pulmonary:     Effort: Pulmonary effort is normal. No respiratory distress.     Breath sounds: Normal breath sounds. No wheezing, rhonchi or rales.  Abdominal:     General: Bowel sounds are normal.     Palpations: Abdomen is soft.     Tenderness: There is no abdominal tenderness.  Lymphadenopathy:     Cervical: No cervical adenopathy.  Neurological:     Mental Status: He is alert and oriented to person, place, and time.  Psychiatric:        Mood and Affect: Mood normal.        Behavior: Behavior normal.     Diabetic Foot Exam - Simple   No data filed      Lab Results  Component Value Date   WBC 6.0 09/16/2022   HGB 14.9 09/16/2022   HCT 43.0 09/16/2022   PLT 169 09/16/2022   GLUCOSE 110 (H) 09/16/2022   CHOL 98 (L) 09/16/2022   TRIG 96 09/16/2022   HDL 36 (L) 09/16/2022   LDLCALC 43 09/16/2022   ALT 42 09/16/2022   AST 26 09/16/2022   NA 140 09/16/2022   K 4.5 09/16/2022   CL 101 09/16/2022   CREATININE 0.73 (L) 09/16/2022   BUN 8 09/16/2022   CO2 25 09/16/2022   TSH 0.181 (L) 09/16/2022   HGBA1C 5.5 09/16/2022   MICROALBUR 30 07/29/2020      Assessment & Plan:    Acute cystitis without hematuria Assessment & Plan: Checked  UA Ordered Urine culture Sent Ciprofloxacin 250 mg BID  Orders: -     POCT URINALYSIS DIP (CLINITEK) -     Ciprofloxacin HCl; Take 1 tablet (250 mg total) by mouth 2 (two) times daily for 3 days.  Dispense: 6 tablet; Refill: 0 -     Urine Culture  Secondary hypothyroidism Assessment & Plan: Continue synthroid 125 mcg daily. Will recheck TSH today.    Orders: -     TSH  Diabetic glomerulopathy Assessment & Plan: Continue low carbohydrate, low sugar diet.  Check feet daily.  Begin zepbound as directed.    Orders: -     Hemoglobin A1c -     Zepbound; Inject 2.5 mg into the skin once a week.  Dispense: 2 mL; Refill: 0  Hypertensive heart disease without heart failure Assessment & Plan: Well controlled.  No changes to medicines. Telmisartan 80 mg once daily, coreg 6.25 mg one twice daily.  Continue to work on eating a healthy diet and exercise.  Labs drawn today.    Orders: -     Comprehensive metabolic panel -     CBC with Differential/Platelet -     Cardiovascular Risk Assessment  Mixed hyperlipidemia Assessment & Plan: Continue lipitor 80 mg daily. Heart healthy diet. Labs drawn.  Orders: -     Lipid panel     Meds ordered this encounter  Medications   tirzepatide (ZEPBOUND) 2.5 MG/0.5ML Pen    Sig: Inject 2.5 mg into the skin once a week.    Dispense:  2 mL    Refill:  0   ciprofloxacin (CIPRO) 250 MG tablet    Sig: Take 1 tablet (250 mg total) by mouth 2 (two) times daily for 3 days.    Dispense:  6 tablet    Refill:  0    Orders Placed This Encounter  Procedures   Urine Culture   Cardiovascular Risk Assessment   POCT URINALYSIS DIP (CLINITEK)     Follow-up: Return in about 3 months (around 12/16/2022) for chronic follow up, fasting.  I,Marla I Leal-Borjas,acting as a scribe for Blane Ohara, MD.,have documented all relevant documentation on the behalf of Blane Ohara, MD,as directed by  Blane Ohara, MD while in the presence of Blane Ohara, MD.     An After Visit Summary was printed and given to the patient.  I attest that I have reviewed this visit and agree with the plan scribed by my staff.   Blane Ohara, MD [facility] [phone]

## 2022-09-16 NOTE — Assessment & Plan Note (Signed)
Continue low carbohydrate, low sugar diet.  Check feet daily.  Begin zepbound as directed.

## 2022-09-16 NOTE — Assessment & Plan Note (Signed)
Continue synthroid 125 mcg daily. Will recheck TSH today.

## 2022-09-17 DIAGNOSIS — N3000 Acute cystitis without hematuria: Secondary | ICD-10-CM | POA: Insufficient documentation

## 2022-09-17 LAB — CBC WITH DIFFERENTIAL/PLATELET
Basophils Absolute: 0 10*3/uL (ref 0.0–0.2)
Basos: 0 %
EOS (ABSOLUTE): 0.1 10*3/uL (ref 0.0–0.4)
Eos: 1 %
Hematocrit: 43 % (ref 37.5–51.0)
Hemoglobin: 14.9 g/dL (ref 13.0–17.7)
Immature Grans (Abs): 0 10*3/uL (ref 0.0–0.1)
Immature Granulocytes: 0 %
Lymphocytes Absolute: 1.8 10*3/uL (ref 0.7–3.1)
Lymphs: 31 %
MCH: 31.9 pg (ref 26.6–33.0)
MCHC: 34.7 g/dL (ref 31.5–35.7)
MCV: 92 fL (ref 79–97)
Monocytes Absolute: 0.7 10*3/uL (ref 0.1–0.9)
Monocytes: 11 %
Neutrophils Absolute: 3.4 10*3/uL (ref 1.4–7.0)
Neutrophils: 57 %
Platelets: 169 10*3/uL (ref 150–450)
RBC: 4.67 x10E6/uL (ref 4.14–5.80)
RDW: 12.5 % (ref 11.6–15.4)
WBC: 6 10*3/uL (ref 3.4–10.8)

## 2022-09-17 LAB — LIPID PANEL
Chol/HDL Ratio: 2.7 ratio (ref 0.0–5.0)
Cholesterol, Total: 98 mg/dL — ABNORMAL LOW (ref 100–199)
HDL: 36 mg/dL — ABNORMAL LOW (ref >39)
LDL Chol Calc (NIH): 43 mg/dL (ref 0–99)
Triglycerides: 96 mg/dL (ref 0–149)
VLDL Cholesterol Cal: 19 mg/dL (ref 5–40)

## 2022-09-17 LAB — COMPREHENSIVE METABOLIC PANEL
ALT: 42 IU/L (ref 0–44)
AST: 26 IU/L (ref 0–40)
Albumin/Globulin Ratio: 2.5 — ABNORMAL HIGH (ref 1.2–2.2)
Albumin: 4.5 g/dL (ref 3.9–4.9)
Alkaline Phosphatase: 104 IU/L (ref 44–121)
BUN/Creatinine Ratio: 11 (ref 10–24)
BUN: 8 mg/dL (ref 8–27)
Bilirubin Total: 0.8 mg/dL (ref 0.0–1.2)
CO2: 25 mmol/L (ref 20–29)
Calcium: 9 mg/dL (ref 8.6–10.2)
Chloride: 101 mmol/L (ref 96–106)
Creatinine, Ser: 0.73 mg/dL — ABNORMAL LOW (ref 0.76–1.27)
Globulin, Total: 1.8 g/dL (ref 1.5–4.5)
Glucose: 110 mg/dL — ABNORMAL HIGH (ref 70–99)
Potassium: 4.5 mmol/L (ref 3.5–5.2)
Sodium: 140 mmol/L (ref 134–144)
Total Protein: 6.3 g/dL (ref 6.0–8.5)
eGFR: 103 mL/min/{1.73_m2} (ref >59)

## 2022-09-17 LAB — HEMOGLOBIN A1C
Est. average glucose Bld gHb Est-mCnc: 111 mg/dL
Hgb A1c MFr Bld: 5.5 % (ref 4.8–5.6)

## 2022-09-17 LAB — TSH: TSH: 0.181 u[IU]/mL — ABNORMAL LOW (ref 0.450–4.500)

## 2022-09-17 LAB — CARDIOVASCULAR RISK ASSESSMENT

## 2022-09-17 NOTE — Assessment & Plan Note (Addendum)
Checked UA Ordered Urine culture Sent Ciprofloxacin 250 mg BID

## 2022-09-17 NOTE — Assessment & Plan Note (Signed)
Well controlled.  No changes to medicines. Telmisartan 80 mg once daily, coreg 6.25 mg one twice daily.  Continue to work on eating a healthy diet and exercise.  Labs drawn today.   

## 2022-09-18 LAB — URINE CULTURE: Organism ID, Bacteria: NO GROWTH

## 2022-09-19 ENCOUNTER — Encounter: Payer: Self-pay | Admitting: Family Medicine

## 2022-09-19 ENCOUNTER — Telehealth: Payer: Self-pay

## 2022-09-19 NOTE — Telephone Encounter (Signed)
Patient called stating that he heard from his insurance and the pharmacy, that his insurance will not pay for the Zepbound and wanted to call and let you know and see what different medication you wanted to try him on since this one was denied. Please advise.

## 2022-09-20 NOTE — Telephone Encounter (Signed)
Patient informed, patient is ok with trying the Contrave and wants it sent to [facility].

## 2022-09-21 ENCOUNTER — Ambulatory Visit: Payer: Managed Care, Other (non HMO) | Admitting: Family Medicine

## 2022-09-26 ENCOUNTER — Other Ambulatory Visit: Payer: Self-pay | Admitting: Family Medicine

## 2022-09-26 MED ORDER — CONTRAVE 8-90 MG PO TB12: ORAL_TABLET | ORAL | 0 refills | Status: DC

## 2022-09-26 NOTE — Telephone Encounter (Signed)
Contrave    once daily in the morning for week 1; then 1 tablet twice daily, morning and evening, for week 2; then 2 tablets in the morning and 1 tablet in the evening for week 3, Maintenance (week 4 and thereafter), 2 tablets orally twice daily.

## 2022-09-26 NOTE — Telephone Encounter (Signed)
Can you please send in the contrave? I'm not able to send this because it is controlled.

## 2022-10-03 ENCOUNTER — Other Ambulatory Visit: Payer: Self-pay

## 2022-10-03 DIAGNOSIS — E038 Other specified hypothyroidism: Secondary | ICD-10-CM

## 2022-10-03 MED ORDER — LEVOTHYROXINE SODIUM 125 MCG PO TABS
125.0000 ug | ORAL_TABLET | Freq: Every day | ORAL | 0 refills | Status: DC
Start: 2022-10-03 — End: 2022-12-29

## 2022-10-04 ENCOUNTER — Other Ambulatory Visit: Payer: Self-pay

## 2022-10-10 ENCOUNTER — Other Ambulatory Visit: Payer: Self-pay

## 2022-10-10 MED ORDER — CONTRAVE 8-90 MG PO TB12: ORAL_TABLET | ORAL | 0 refills | Status: DC

## 2022-10-19 ENCOUNTER — Telehealth: Payer: Self-pay

## 2022-10-19 NOTE — Progress Notes (Signed)
Acute Office Visit  Subjective:    Patient ID: Gerald Jordan, male    DOB: [birth year], 62 y.o.   MRN: [id number]  Chief Complaint  Patient presents with   Dysuria    HPI: Patient is in today for dysuria.  Last seen 09/16/22:  Checked UA Ordered Urine culture Sent Ciprofloxacin 250 mg BID  Past Medical History:  Diagnosis Date   Anxiety    Atherosclerotic heart disease of native coronary artery without angina pectoris 2019   Ventricular fibrillation - cardioverted x 3 in ambulance. 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   Type 2 diabetes mellitus (HCC)    Unspecified hearing loss, bilateral    Ventricular fibrillation [facility])     Past Surgical History:  Procedure Laterality Date   ANTERIOR CERVICAL DECOMP/DISCECTOMY FUSION N/A 02/17/2022   Procedure: ANTERIOR CERVICAL DECOMPRESSION/DISCECTOMY FUSION  CERVICAL FIVE-SEVEN;  Surgeon: Venita Lick, MD;  Location: [facility] OR;  Service: Orthopedics;  Laterality: N/A;   arm surgery  05/2014   CARDIAC CATHETERIZATION  2019   one stent placed   CHOLECYSTECTOMY  02/2016   COLONOSCOPY  2022   HERNIA REPAIR     1999-umbilical   NASAL SINUS SURGERY  2000   tendon repair 11/2014      Family History  Problem Relation Age of Onset   Breast cancer Mother    Melanoma Father    Diabetes Sister    Arrhythmia Brother    Congestive Heart Failure Brother    Heart attack Brother    Atrial fibrillation Brother     Social History   Socioeconomic History   Marital status: Married    Spouse name: Not  on file   Number of children: 1   Years of education: Not on file   Highest education level: Not on file  Occupational History   Occupation: Production designer, theatre/television/film  Tobacco Use   Smoking status: Never   Smokeless tobacco: Never  Vaping Use   Vaping Use: Never used  Substance and Sexual Activity   Alcohol use: Not Currently   Drug use: No   Sexual activity: Yes    Partners: Female  Other Topics Concern   Not on file  Social History Narrative   Not on file   Social Determinants of Health   Financial Resource Strain: Not on file  Food Insecurity: Not on file  Transportation Needs: Not on file  Physical Activity: Not on file  Stress: Not on file  Social Connections: Not on file  Intimate Partner Violence: Not on file    Outpatient Medications Prior to Visit  Medication Sig Dispense Refill   aspirin 81 MG tablet Take 81 mg by mouth daily.     atorvastatin (LIPITOR) 80 MG tablet Take 1 tablet (80 mg total) by mouth daily. 90 tablet 3   carvedilol (COREG) 6.25 MG tablet TAKE 1 TABLET TWICE A DAY 180 tablet 3   citalopram (CELEXA) 20 MG tablet TAKE 1 TABLET DAILY  90 tablet 3   lamoTRIgine (LAMICTAL) 150 MG tablet TAKE 2 TABLETS DAILY AS DIRECTED BY YOUR PRESCRIBER 180 tablet 3   latanoprost (XALATAN) 0.005 % ophthalmic solution Place 1 drop into both eyes nightly. 2.5 mL 2   levothyroxine (SYNTHROID) 125 MCG tablet Take 1 tablet (125 mcg total) by mouth daily. 90 tablet 0   Naltrexone-buPROPion HCl ER (CONTRAVE) 8-90 MG TB12 once daily in the morning for week 1; then 1 tablet twice daily, morning and evening, for week 2; then 2 tablets in the morning and 1 tablet in the evening for week 3, Maintenance (week 4 and thereafter), 2 tablets orally twice daily. 360 tablet 0   nitroGLYCERIN (NITROSTAT) 0.4 MG SL tablet Place 1 tablet (0.4 mg total) under the tongue every 5 (five) minutes as needed for chest pain. 25 tablet 0   telmisartan (MICARDIS) 80 MG tablet TAKE 1 TABLET DAILY 90 tablet 3    tirzepatide (ZEPBOUND) 2.5 MG/0.5ML Pen Inject 2.5 mg into the skin once a week. 2 mL 0   No facility-administered medications prior to visit.    Allergies  Allergen Reactions   Penicillins Nausea And Vomiting    Mouth Ulcers - tolerated Augmentin 09/2012     Lisinopril Cough   Ozempic (0.25 Or 0.5 Mg-Dose) [Semaglutide(0.25 Or 0.5mg -Dos)] Nausea Only and Other (See Comments)    Severe nausea, upper abdominal discomfort    Review of Systems  Constitutional:  Negative for chills and fever.  HENT:  Negative for congestion and sore throat.   Respiratory:  Negative for cough and shortness of breath.   Cardiovascular:  Negative for chest pain.  Gastrointestinal:  Negative for abdominal pain and nausea.  Genitourinary:  Negative for dysuria, flank pain, frequency and urgency.  Musculoskeletal:  Negative for back pain.       Objective:        09/16/2022    8:22 AM 06/13/2022    8:10 AM 03/22/2022    2:58 PM  Vitals with BMI  Height 5\' 9"  5\' 9"  5\' 9"   Weight 264 lbs 273 lbs 268 lbs  BMI 38.97 40.3 39.56  Systolic 120 130 161  Diastolic 64 70 74  Pulse 64 60 65    No data found.   Physical Exam Vitals reviewed.  Constitutional:      Appearance: Normal appearance. He is normal weight.  Cardiovascular:     Rate and Rhythm: Normal rate and regular rhythm.     Heart sounds: No murmur heard. Pulmonary:     Effort: Pulmonary effort is normal.     Breath sounds: Normal breath sounds.  Abdominal:     General: Abdomen is flat. Bowel sounds are normal.     Palpations: Abdomen is soft.     Tenderness: There is no abdominal tenderness.  Neurological:     Mental Status: He is alert and oriented to person, place, and time.  Psychiatric:        Mood and Affect: Mood normal.        Behavior: Behavior normal.     Health Maintenance Due  Topic Date Due   OPHTHALMOLOGY EXAM  03/10/2022    There are no preventive care reminders to display for this patient.   Lab Results   Component Value Date   TSH 0.181 (L) 09/16/2022   Lab Results  Component Value Date   WBC 6.0 09/16/2022   HGB 14.9 09/16/2022   HCT 43.0 09/16/2022   MCV 92 09/16/2022   PLT 169 09/16/2022  Lab Results  Component Value Date   NA 140 09/16/2022   K 4.5 09/16/2022   CO2 25 09/16/2022   GLUCOSE 110 (H) 09/16/2022   BUN 8 09/16/2022   CREATININE 0.73 (L) 09/16/2022   BILITOT 0.8 09/16/2022   ALKPHOS 104 09/16/2022   AST 26 09/16/2022   ALT 42 09/16/2022   PROT 6.3 09/16/2022   ALBUMIN 4.5 09/16/2022   CALCIUM 9.0 09/16/2022   ANIONGAP 7 02/11/2022   EGFR 103 09/16/2022   Lab Results  Component Value Date   CHOL 98 (L) 09/16/2022   Lab Results  Component Value Date   HDL 36 (L) 09/16/2022   Lab Results  Component Value Date   LDLCALC 43 09/16/2022   Lab Results  Component Value Date   TRIG 96 09/16/2022   Lab Results  Component Value Date   CHOLHDL 2.7 09/16/2022   Lab Results  Component Value Date   HGBA1C 5.5 09/16/2022       Assessment & Plan:  There are no diagnoses linked to this encounter.   No orders of the defined types were placed in this encounter.   No orders of the defined types were placed in this encounter.    Follow-up: No follow-ups on file.  An After Visit Summary was printed and given to the patient.  Blane Ohara, MD [facility] [phone]

## 2022-10-19 NOTE — Telephone Encounter (Signed)
Patient called stating that he had finished his antibiotic like he was told to to help with his urinary pain he was having the last time he came in and now he states that he is still having the pain with urination and don't know what he needs to do? He is requesting refill on the antibiotic. Please advise.

## 2022-10-19 NOTE — Telephone Encounter (Signed)
Patient informed and scheduled.

## 2022-10-20 ENCOUNTER — Encounter: Payer: Self-pay | Admitting: Family Medicine

## 2022-10-20 ENCOUNTER — Ambulatory Visit: Payer: Managed Care, Other (non HMO) | Admitting: Family Medicine

## 2022-10-20 VITALS — BP 132/80 | HR 60 | Temp 97.2°F | Ht 69.0 in | Wt 273.0 lb

## 2022-10-20 DIAGNOSIS — N411 Chronic prostatitis: Secondary | ICD-10-CM | POA: Diagnosis not present

## 2022-10-20 MED ORDER — CIPROFLOXACIN HCL 250 MG PO TABS
250.0000 mg | ORAL_TABLET | Freq: Two times a day (BID) | ORAL | 0 refills | Status: AC
Start: 2022-10-20 — End: 2022-11-03

## 2022-10-20 NOTE — Assessment & Plan Note (Signed)
Check labs  Cipro 250 mg twice daily x 14 days.

## 2022-10-21 LAB — PSA: Prostate Specific Ag, Serum: 0.5 ng/mL (ref 0.0–4.0)

## 2022-10-24 LAB — POCT URINALYSIS DIP (CLINITEK)
Bilirubin, UA: NEGATIVE
Blood, UA: NEGATIVE
Glucose, UA: NEGATIVE mg/dL
Ketones, POC UA: NEGATIVE mg/dL
Leukocytes, UA: NEGATIVE
Nitrite, UA: NEGATIVE
POC PROTEIN,UA: NEGATIVE
Spec Grav, UA: 1.015 (ref 1.010–1.025)
Urobilinogen, UA: NEGATIVE E.U./dL — AB
pH, UA: 6 (ref 5.0–8.0)

## 2022-11-22 ENCOUNTER — Ambulatory Visit (INDEPENDENT_AMBULATORY_CARE_PROVIDER_SITE_OTHER): Payer: Managed Care, Other (non HMO) | Admitting: Physician Assistant

## 2022-11-22 ENCOUNTER — Encounter: Payer: Self-pay | Admitting: Physician Assistant

## 2022-11-22 VITALS — BP 126/70 | HR 60 | Temp 97.4°F | Ht 69.0 in | Wt 273.0 lb

## 2022-11-22 DIAGNOSIS — J069 Acute upper respiratory infection, unspecified: Secondary | ICD-10-CM

## 2022-11-22 LAB — POC COVID19 BINAXNOW: SARS Coronavirus 2 Ag: NEGATIVE

## 2022-11-22 MED ORDER — AZITHROMYCIN 250 MG PO TABS
ORAL_TABLET | ORAL | 0 refills | Status: AC
Start: 2022-11-22 — End: 2022-11-27

## 2022-11-22 MED ORDER — GENTAMICIN SULFATE 0.3 % OP SOLN
1.0000 [drp] | Freq: Three times a day (TID) | OPHTHALMIC | 0 refills | Status: DC
Start: 2022-11-22 — End: 2023-01-06

## 2022-11-22 NOTE — Progress Notes (Signed)
Acute Office Visit  Subjective:    Patient ID: Gerald Jordan, male    DOB: [birth year], 62 y.o.   MRN: [id number]  Chief Complaint  Patient presents with   Cough   Sore Throat    HPI: Patient is in today for complaints of sore throat, eyes mattered, cough and congestion for the past several days.  Few family members with similar symptoms.  Had low grade fever past several nights.  Cough minimally productive.     Current Outpatient Medications:    aspirin 81 MG tablet, Take 81 mg by mouth daily., Disp: , Rfl:    atorvastatin (LIPITOR) 80 MG tablet, Take 1 tablet (80 mg total) by mouth daily., Disp: 90 tablet, Rfl: 3   azithromycin (ZITHROMAX) 250 MG tablet, Take 2 tablets on day 1, then 1 tablet daily on days 2 through 5, Disp: 6 tablet, Rfl: 0   carvedilol (COREG) 6.25 MG tablet, TAKE 1 TABLET TWICE A DAY, Disp: 180 tablet, Rfl: 3   citalopram (CELEXA) 20 MG tablet, TAKE 1 TABLET DAILY, Disp: 90 tablet, Rfl: 3   gentamicin (GARAMYCIN) 0.3 % ophthalmic solution, Place 1 drop into both eyes 3 (three) times daily., Disp: 5 mL, Rfl: 0   lamoTRIgine (LAMICTAL) 150 MG tablet, TAKE 2 TABLETS DAILY AS DIRECTED BY YOUR PRESCRIBER, Disp: 180 tablet, Rfl: 3   latanoprost (XALATAN) 0.005 % ophthalmic solution, Place 1 drop into both eyes nightly., Disp: 2.5 mL, Rfl: 2   levothyroxine (SYNTHROID) 125 MCG tablet, Take 1 tablet (125 mcg total) by mouth daily., Disp: 90 tablet, Rfl: 0   Naltrexone-buPROPion HCl ER (CONTRAVE) 8-90 MG TB12, once daily in the morning for week 1; then 1 tablet twice daily, morning and evening, for week 2; then 2 tablets in the morning and 1 tablet in the evening for week 3, Maintenance (week 4 and thereafter), 2 tablets orally twice daily., Disp: 360 tablet, Rfl: 0   nitroGLYCERIN (NITROSTAT) 0.4 MG SL tablet, Place 1 tablet (0.4 mg total) under the tongue every 5 (five) minutes as needed for chest pain., Disp: 25 tablet, Rfl: 0   telmisartan (MICARDIS) 80 MG tablet,  TAKE 1 TABLET DAILY, Disp: 90 tablet, Rfl: 3  Allergies  Allergen Reactions   Penicillins Nausea And Vomiting    Mouth Ulcers - tolerated Augmentin 09/2012     Lisinopril Cough   Ozempic (0.25 Or 0.5 Mg-Dose) [Semaglutide(0.25 Or 0.5mg -Dos)] Nausea Only and Other (See Comments)    Severe nausea, upper abdominal discomfort    ROS CONSTITUTIONAL: see HPI E/N/T: see HPI CARDIOVASCULAR: Negative for chest pain, dizziness, palpitations RESPIRATORY: see HPI GASTROINTESTINAL: Negative for abdominal pain, acid reflux symptoms, constipation, diarrhea, nausea and vomiting.      Objective:    PHYSICAL EXAM:   BP 126/70 (BP Location: Left Arm, Patient Position: Sitting, Cuff Size: Large)   Pulse 60   Temp (!) 97.4 F (36.3 C) (Temporal)   Ht 5\' 9"  (1.753 m)   Wt 273 lb (123.8 kg)   SpO2 95%   BMI 40.32 kg/m    GEN: Well nourished, well developed, in no acute distress  HEENT: normal external ears and nose - normal external auditory canals and TMS - - Lips, Teeth and Gums - normal  Eyes with mild matter noted  Oropharynx - erythema/pnd Cardiac: RRR; no murmurs, rubs,  Respiratory:  normal respiratory rate and pattern with no distress - normal breath sounds with no rales, rhonchi, wheezes or rubs  Office Visit on 11/22/2022  Component Date Value Ref Range Status   SARS Coronavirus 2 Ag 11/22/2022 Negative  Negative Final       Assessment & Plan:    Acute upper respiratory infection -     POC COVID-19 BinaxNow -     Azithromycin; Take 2 tablets on day 1, then 1 tablet daily on days 2 through 5  Dispense: 6 tablet; Refill: 0 -     Gentamicin Sulfate; Place 1 drop into both eyes 3 (three) times daily.  Dispense: 5 mL; Refill: 0   Otc decongestants  Follow-up: Return if symptoms worsen or fail to improve.  An After Visit Summary was printed and given to the patient.  Jettie Pagan [facility] [phone]

## 2022-12-20 ENCOUNTER — Telehealth: Payer: Self-pay

## 2022-12-20 NOTE — Telephone Encounter (Signed)
Prescription Request  12/20/2022   What is the name of the medication or equipment? Synthroid 90 day supply   Have you contacted your pharmacy to request a refill? No   Which pharmacy would you like this sent to?  [facility] - [place], [place] - [address] [place] [address] Phone: [phone] Fax: [phone]    Patient notified that their request is being sent to the clinical staff for review and that they should receive a response within 2 business days.   Please advise at [facility] [phone]

## 2022-12-29 ENCOUNTER — Other Ambulatory Visit: Payer: Self-pay

## 2022-12-29 DIAGNOSIS — E038 Other specified hypothyroidism: Secondary | ICD-10-CM

## 2022-12-29 MED ORDER — LEVOTHYROXINE SODIUM 125 MCG PO TABS
125.0000 ug | ORAL_TABLET | Freq: Every day | ORAL | 3 refills | Status: DC
Start: 2022-12-29 — End: 2023-01-11

## 2023-01-05 NOTE — Progress Notes (Unsigned)
Subjective:  Patient ID: Gerald Jordan, male    DOB: Mar 03, 1961  Age: 62 y.o. MRN: [id number]  Chief Complaint  Patient presents with   Medical Management of Chronic Issues   HPI Diabetes:  Complications: CAD Glucose checking: once in a while.  Glucose logs:110-130. Hypoglycemia:no Most recent A1C:5.5 Current medications: none Last Eye Exam: utd Foot checks: daily  Hyperlipidemia: Current medications: lipitor 80 mg once daily  Hypertension: Current medications:telmisartan 80 mg once daily, coreg 6.25 mg one twice daily.  Diet: healthy.  Exercise: very active  Hypothyroidism: on synthroid 125 mcg once daily in am.   Bipolar: lamictal 150 mg twice daily.    Coronary artery disease: In 2019 patient went into ventricular fibrillation and underwent cardioversion x 2.  He was transferred to [facility] and had a non-STEMI.  He underwent cardiac cath that required stenting.  Patient is currently on Lipitor, carvedilol, and aspirin.  Obesity: Currently on Contrave.  Has not lost a lot of weight but would like to give it more time.     01/06/2023    7:30 AM 10/20/2022    8:50 AM 12/09/2021    8:38 AM 06/10/2021    9:24 AM 07/27/2020    2:17 PM  Depression screen PHQ 2/9  Decreased Interest 0 0 0 0 0  Down, Depressed, Hopeless 0 0 0 0 0  PHQ - 2 Score 0 0 0 0 0  Altered sleeping 0 0 0    Tired, decreased energy 0 1 0    Change in appetite 0 3 0    Feeling bad or failure about yourself  0 0 0    Trouble concentrating 0 0 0    Moving slowly or fidgety/restless 0 0 0    Suicidal thoughts 0 0 0    PHQ-9 Score 0 4 0    Difficult doing work/chores Not difficult at all Not difficult at all Not difficult at all          01/06/2023    7:30 AM  Fall Risk   Falls in the past year? 0  Number falls in past yr: 0  Injury with Fall? 0  Risk for fall due to : No Fall Risks  Follow up Falls evaluation completed;Follow up appointment    Patient Care Team: Blane Ohara, MD as PCP - General (Family Medicine)   Review of Systems  Constitutional:  Negative for chills and fever.  HENT:  Negative for congestion, rhinorrhea and sore throat.   Respiratory:  Negative for cough and shortness of breath.   Cardiovascular:  Negative for chest pain and palpitations.  Gastrointestinal:  Negative for abdominal pain, constipation, diarrhea, nausea and vomiting.  Genitourinary:  Negative for dysuria and urgency.  Musculoskeletal:  Positive for myalgias (left arm pain). Negative for arthralgias and back pain.  Neurological:  Negative for dizziness and headaches.  Psychiatric/Behavioral:  Negative for dysphoric mood. The patient is not nervous/anxious.    Current Outpatient Medications on File Prior to Visit  Medication Sig Dispense Refill   aspirin 81 MG tablet Take 81 mg by mouth daily.     atorvastatin (LIPITOR) 80 MG tablet Take 1 tablet (80 mg total) by mouth daily. 90 tablet 3   carvedilol (COREG) 6.25 MG tablet TAKE 1 TABLET TWICE A DAY 180 tablet 3   citalopram (CELEXA) 20 MG tablet TAKE 1 TABLET DAILY 90 tablet 3   lamoTRIgine (LAMICTAL) 150 MG tablet TAKE 2 TABLETS DAILY AS DIRECTED BY  YOUR PRESCRIBER 180 tablet 3   latanoprost (XALATAN) 0.005 % ophthalmic solution Place 1 drop into both eyes nightly. 2.5 mL 2   levothyroxine (SYNTHROID) 125 MCG tablet Take 1 tablet (125 mcg total) by mouth daily. 90 tablet 3   Naltrexone-buPROPion HCl ER (CONTRAVE) 8-90 MG TB12 once daily in the morning for week 1; then 1 tablet twice daily, morning and evening, for week 2; then 2 tablets in the morning and 1 tablet in the evening for week 3, Maintenance (week 4 and thereafter), 2 tablets orally twice daily. 360 tablet 0   nitroGLYCERIN (NITROSTAT) 0.4 MG SL tablet Place 1 tablet (0.4 mg total) under the tongue every 5 (five) minutes as needed for chest pain. 25 tablet 0   telmisartan (MICARDIS) 80 MG tablet TAKE 1 TABLET DAILY 90 tablet 3   No current  facility-administered medications on file prior to visit.   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   Type 2 diabetes mellitus (HCC)    Unspecified hearing loss, bilateral    Ventricular fibrillation [facility])    Past Surgical History:  Procedure Laterality Date   ANTERIOR CERVICAL DECOMP/DISCECTOMY FUSION N/A 02/17/2022   Procedure: ANTERIOR CERVICAL DECOMPRESSION/DISCECTOMY FUSION  CERVICAL FIVE-SEVEN;  Surgeon: Venita Lick, MD;  Location: [facility] OR;  Service: Orthopedics;  Laterality: N/A;   arm surgery  05/2014   CARDIAC CATHETERIZATION  2019   one stent placed   CHOLECYSTECTOMY  02/2016   COLONOSCOPY  2022   HERNIA REPAIR     1999-umbilical   NASAL SINUS SURGERY  2000   tendon repair 11/2014      Family History  Problem Relation Age of Onset   Breast cancer Mother    Melanoma Father    Diabetes Sister    Arrhythmia Brother    Congestive Heart Failure Brother    Heart attack Brother    Atrial fibrillation Brother    Social History   Socioeconomic History   Marital status: Married    Spouse name: Not on file   Number of children: 1   Years of education: Not on file   Highest education level: Not on file  Occupational History   Occupation: Production designer, theatre/television/film  Tobacco Use   Smoking status: Never   Smokeless tobacco: Never  Vaping Use   Vaping status:  Never Used  Substance and Sexual Activity   Alcohol use: Not Currently   Drug use: No   Sexual activity: Yes    Partners: Female  Other Topics Concern   Not on file  Social History Narrative   Not on file   Social Determinants of Health   Financial Resource Strain: Not on file  Food Insecurity: Not on file  Transportation Needs: Not on file  Physical Activity: Not on file  Stress: Not on file  Social Connections: Unknown (10/08/2021)   [facility]   Social Network    Social Network: Not on file    Objective:  BP 138/72   Pulse (!) 56   Temp (!) 97.2 F (36.2 C)   Resp 16   Ht 5\' 9"  (1.753 m)   Wt 270 lb 9.6 oz (122.7 kg)   BMI 39.96 kg/m      01/06/2023    7:27 AM 11/22/2022    2:47 PM 10/20/2022    8:45 AM  BP/Weight  Systolic BP 138 126 132  Diastolic BP 72 70 80  Wt. (Lbs) 270.6 273 273  BMI 39.96 kg/m2 40.32 kg/m2 40.32 kg/m2    Physical Exam Vitals reviewed.  Constitutional:      Appearance: Normal appearance. He is obese.  Neck:     Vascular: No carotid bruit.  Cardiovascular:     Rate and Rhythm: Normal rate and regular rhythm.     Pulses: Normal pulses.     Heart sounds: Normal heart sounds.  Pulmonary:     Effort: Pulmonary effort is normal.     Breath sounds: Normal breath sounds. No wheezing, rhonchi or rales.  Abdominal:     General: Bowel sounds are normal.     Palpations: Abdomen is soft.     Tenderness: There is no abdominal tenderness.  Neurological:     Mental Status: He is alert and oriented to person, place, and time.  Psychiatric:        Mood and Affect: Mood normal.        Behavior: Behavior normal.     Diabetic Foot Exam - Simple   Simple Foot Form Diabetic Foot exam was performed with the following findings: Yes 01/06/2023  7:32 AM  Visual Inspection No deformities, no ulcerations, no other skin breakdown bilaterally: Yes Sensation Testing Intact to touch and monofilament testing bilaterally:  Yes Pulse Check Posterior Tibialis and Dorsalis pulse intact bilaterally: Yes Comments      Lab Results  Component Value Date   WBC 5.7 01/06/2023   HGB 15.4 01/06/2023   HCT 45.3 01/06/2023   PLT 163 01/06/2023   GLUCOSE 144 (H) 01/06/2023   CHOL 123 01/06/2023   TRIG 114 01/06/2023   HDL 40 01/06/2023   LDLCALC 62 01/06/2023   ALT 41 01/06/2023   AST 27 01/06/2023   NA 141 01/06/2023   K 4.6 01/06/2023   CL 101 01/06/2023   CREATININE 1.01 01/06/2023   BUN 8 01/06/2023   CO2 25 01/06/2023   TSH 0.181 (L) 09/16/2022   HGBA1C 6.3 (H) 01/06/2023   MICROALBUR 30 07/29/2020      Assessment & Plan:    Hypertensive heart disease without heart failure Assessment & Plan: Well controlled.  No changes to medicines. Telmisartan 80 mg once daily, coreg 6.25 mg one twice daily.  Continue to work on eating a healthy diet and exercise.  Labs drawn today.    Orders: -     CBC with Differential/Platelet -     Comprehensive metabolic panel  OSA on CPAP Assessment & Plan: Using CPAP.   Diabetic glomerulopathy (HCC) Assessment & Plan: Control: Well controlled Recommend check sugars fasting daily. Recommend check feet daily. Recommend annual eye exams. Medicines: none Continue to work on eating a healthy diet and exercise.  Labs drawn today.      Orders: -     Hemoglobin A1c  Mixed hyperlipidemia Assessment & Plan: Continue  lipitor 80 mg daily. Heart healthy diet. Labs drawn.    Orders: -     Lipid panel  Coronary artery disease of native artery of native heart with stable angina pectoris [facility]) Assessment & Plan: Well-controlled. Continue carvedilol, aspirin, Lipitor. Follow-up with cardiology.   Secondary hypothyroidism Assessment & Plan: Check TSH and free T4.  Orders: -     T4, free -     TSH  Class 2 severe obesity due to excess calories with serious comorbidity and body mass index (BMI) of 39.0 to 39.9 in adult [facility]) Assessment &  Plan: Recommend continue to work on eating healthy diet and exercise. Continue contrave Comorbidities include diabetes, and coronary artery disease   Bipolar 2 disorder, major depressive episode (HCC) Assessment & Plan: The current medical regimen is effective;  continue present plan and medications. Continue lamictal.      No orders of the defined types were placed in this encounter.   Orders Placed This Encounter  Procedures   CBC with Differential/Platelet   Comprehensive metabolic panel   Hemoglobin A1c   Lipid panel   T4, free   TSH     Follow-up: Return in about 3 months (around 04/08/2023) for chronic fasting.   I,Marla I Leal-Borjas,acting as a scribe for Blane Ohara, MD.,have documented all relevant documentation on the behalf of Blane Ohara, MD,as directed by  Blane Ohara, MD while in the presence of Blane Ohara, MD.   An After Visit Summary was printed and given to the patient.  Blane Ohara, MD  Family Practice [phone]

## 2023-01-05 NOTE — Assessment & Plan Note (Signed)
Well controlled.  No changes to medicines. Telmisartan 80 mg once daily, coreg 6.25 mg one twice daily.  Continue to work on eating a healthy diet and exercise.  Labs drawn today.

## 2023-01-05 NOTE — Assessment & Plan Note (Signed)
Control: Well controlled. Recommend check sugars fasting daily. Recommend check feet daily. Recommend annual eye exams. Medicines: none. Continue to work on eating a healthy diet and exercise.  Labs drawn today.    

## 2023-01-05 NOTE — Assessment & Plan Note (Signed)
Using CPAP 

## 2023-01-05 NOTE — Assessment & Plan Note (Signed)
Continue lipitor 80 mg daily. Heart healthy diet. Labs drawn.

## 2023-01-06 ENCOUNTER — Encounter: Payer: Self-pay | Admitting: Family Medicine

## 2023-01-06 ENCOUNTER — Ambulatory Visit: Payer: Managed Care, Other (non HMO) | Admitting: Family Medicine

## 2023-01-06 VITALS — BP 138/72 | HR 56 | Temp 97.2°F | Resp 16 | Ht 69.0 in | Wt 270.6 lb

## 2023-01-06 DIAGNOSIS — E782 Mixed hyperlipidemia: Secondary | ICD-10-CM

## 2023-01-06 DIAGNOSIS — G4733 Obstructive sleep apnea (adult) (pediatric): Secondary | ICD-10-CM | POA: Diagnosis not present

## 2023-01-06 DIAGNOSIS — E1121 Type 2 diabetes mellitus with diabetic nephropathy: Secondary | ICD-10-CM | POA: Diagnosis not present

## 2023-01-06 DIAGNOSIS — I25118 Atherosclerotic heart disease of native coronary artery with other forms of angina pectoris: Secondary | ICD-10-CM

## 2023-01-06 DIAGNOSIS — F3181 Bipolar II disorder: Secondary | ICD-10-CM

## 2023-01-06 DIAGNOSIS — I119 Hypertensive heart disease without heart failure: Secondary | ICD-10-CM | POA: Diagnosis not present

## 2023-01-06 DIAGNOSIS — Z6839 Body mass index (BMI) 39.0-39.9, adult: Secondary | ICD-10-CM

## 2023-01-06 DIAGNOSIS — E038 Other specified hypothyroidism: Secondary | ICD-10-CM

## 2023-01-08 DIAGNOSIS — E66813 Obesity, class 3: Secondary | ICD-10-CM | POA: Insufficient documentation

## 2023-01-08 NOTE — Assessment & Plan Note (Signed)
The current medical regimen is effective;  continue present plan and medications. Continue lamictal.

## 2023-01-08 NOTE — Assessment & Plan Note (Signed)
Well-controlled. Continue carvedilol, aspirin, Lipitor. Follow-up with cardiology.

## 2023-01-08 NOTE — Assessment & Plan Note (Signed)
Check TSH and free T4 

## 2023-01-08 NOTE — Assessment & Plan Note (Signed)
Recommend continue to work on eating healthy diet and exercise. Continue contrave Comorbidities include diabetes, and coronary artery disease

## 2023-01-11 ENCOUNTER — Other Ambulatory Visit: Payer: Self-pay

## 2023-01-11 MED ORDER — LEVOTHYROXINE SODIUM 137 MCG PO TABS: 137.0000 ug | ORAL_TABLET | Freq: Every day | ORAL | 0 refills | Status: DC

## 2023-02-03 ENCOUNTER — Encounter: Payer: Self-pay | Admitting: Family Medicine

## 2023-02-03 ENCOUNTER — Ambulatory Visit (INDEPENDENT_AMBULATORY_CARE_PROVIDER_SITE_OTHER): Payer: Managed Care, Other (non HMO) | Admitting: Family Medicine

## 2023-02-03 VITALS — BP 136/80 | HR 59 | Temp 97.3°F | Ht 69.0 in | Wt 277.0 lb

## 2023-02-03 DIAGNOSIS — R051 Acute cough: Secondary | ICD-10-CM | POA: Diagnosis not present

## 2023-02-03 DIAGNOSIS — H65113 Acute and subacute allergic otitis media (mucoid) (sanguinous) (serous), bilateral: Secondary | ICD-10-CM | POA: Insufficient documentation

## 2023-02-03 LAB — POC COVID19 BINAXNOW: SARS Coronavirus 2 Ag: NEGATIVE

## 2023-02-03 MED ORDER — AMOXICILLIN-POT CLAVULANATE 875-125 MG PO TABS: 1.0000 | ORAL_TABLET | Freq: Two times a day (BID) | ORAL | 0 refills | Status: DC

## 2023-02-03 NOTE — Progress Notes (Signed)
Acute Office Visit  Subjective:    Patient ID: Gerald Jordan, male    DOB: [birth year], 62 y.o.   MRN: [id number]  Chief Complaint  Patient presents with   URI    HPI: Patient is in today for Upper respiratory symptoms He complains of congestion, non productive cough, post nasal drip, and sneezing.with fever but unable to check it at home. Onset of symptoms was  5 days ago and staying constant.He is drinking plenty of fluids.  Past history is significant for  pneumonia and bronchitis years ago (per patient) . Patient has only taken Ibuprofen for fever. Patient is non-smoker   Past Medical History:  Diagnosis Date   Anxiety    Atherosclerotic heart disease of native coronary artery without angina pectoris 2019   Ventricular fibrillation - cardioverted x 3 in ambulance. 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   Type 2 diabetes mellitus (HCC)    Unspecified hearing loss, bilateral    Ventricular fibrillation [facility])     Past Surgical History:  Procedure Laterality Date   ANTERIOR CERVICAL DECOMP/DISCECTOMY FUSION N/A 02/17/2022   Procedure: ANTERIOR CERVICAL DECOMPRESSION/DISCECTOMY FUSION  CERVICAL FIVE-SEVEN;  Surgeon: Venita Lick, MD;  Location: [facility] OR;  Service: Orthopedics;  Laterality: N/A;   arm surgery  05/2014   CARDIAC CATHETERIZATION  2019   one stent placed   CHOLECYSTECTOMY  02/2016   COLONOSCOPY  2022   HERNIA REPAIR     1999-umbilical   NASAL SINUS SURGERY  2000   tendon repair 11/2014      Family History  Problem Relation  Age of Onset   Breast cancer Mother    Melanoma Father    Diabetes Sister    Arrhythmia Brother    Congestive Heart Failure Brother    Heart attack Brother    Atrial fibrillation Brother     Social History   Socioeconomic History   Marital status: Married    Spouse name: Not on file   Number of children: 1   Years of education: Not on file   Highest education level: Not on file  Occupational History   Occupation: Production designer, theatre/television/film  Tobacco Use   Smoking status: Never   Smokeless tobacco: Never  Vaping Use   Vaping status: Never Used  Substance and Sexual Activity   Alcohol use: Not Currently   Drug use: No   Sexual activity: Yes    Partners: Female  Other Topics Concern   Not on file  Social History Narrative   Not on file   Social Determinants of Health   Financial Resource Strain: Low Risk  (02/03/2023)   Overall Financial Resource Strain (CARDIA)    Difficulty of Paying Living Expenses: Not hard at all  Food Insecurity: No Food Insecurity (02/03/2023)   Hunger Vital Sign    Worried About Running Out of Food in the Last Year: Never true    Ran Out of Food in the Last Year: Never true  Transportation Needs: No Transportation Needs (02/03/2023)   PRAPARE - Administrator, Civil Service (Medical): No    Lack of Transportation (Non-Medical): No  Physical Activity: Insufficiently Active (02/03/2023)   Exercise Vital Sign    Days of Exercise per Week: 2 days    Minutes of Exercise per Session: 40 min  Stress: No Stress Concern Present (02/03/2023)   [employer] of Occupational Health - Occupational Stress Questionnaire    Feeling of Stress : Not at all  Social Connections: Moderately Isolated (02/03/2023)   Social Connection and Isolation Panel [NHANES]    Frequency of Communication with Friends and Family: More than three times a week    Frequency of Social Gatherings with Friends and Family: More than three times a week    Attends Religious Services: Never     Database administrator or Organizations: No    Attends Banker Meetings: Never    Marital Status: Married  Catering manager Violence: Not At Risk (02/03/2023)   Humiliation, Afraid, Rape, and Kick questionnaire    Fear of Current or Ex-Partner: No    Emotionally Abused: No    Physically Abused: No    Sexually Abused: No    Outpatient Medications Prior to Visit  Medication Sig Dispense Refill   aspirin 81 MG tablet Take 81 mg by mouth daily.     atorvastatin (LIPITOR) 80 MG tablet Take 1 tablet (80 mg total) by mouth daily. 90 tablet 3   carvedilol (COREG) 6.25 MG tablet TAKE 1 TABLET TWICE A DAY 180 tablet 3   citalopram (CELEXA) 20 MG tablet TAKE 1 TABLET DAILY 90 tablet 3   lamoTRIgine (LAMICTAL) 150 MG tablet TAKE 2 TABLETS DAILY AS DIRECTED BY YOUR PRESCRIBER 180 tablet 3   latanoprost (XALATAN) 0.005 % ophthalmic solution Place 1 drop into both eyes nightly. 2.5 mL 2   levothyroxine (SYNTHROID) 137 MCG tablet Take 1 tablet (137 mcg total) by mouth daily before breakfast. 90 tablet 0   Naltrexone-buPROPion HCl ER (CONTRAVE) 8-90 MG TB12 once daily in the morning for week 1; then 1 tablet twice daily, morning and evening, for week 2; then 2 tablets in the morning and 1 tablet in the evening for week 3, Maintenance (week 4 and thereafter), 2 tablets orally twice daily. 360 tablet 0   nitroGLYCERIN (NITROSTAT) 0.4 MG SL tablet Place 1 tablet (0.4 mg total) under the tongue every 5 (five) minutes as needed for chest pain. 25 tablet 0   telmisartan (MICARDIS) 80 MG tablet TAKE 1 TABLET DAILY 90 tablet 3   No facility-administered medications prior to visit.    Allergies  Allergen Reactions   Penicillins Nausea And Vomiting    Mouth Ulcers - tolerated Augmentin 09/2012     Lisinopril Cough   Ozempic (0.25 Or 0.5 Mg-Dose) [Semaglutide(0.25 Or 0.5mg -Dos)] Nausea Only and Other (See Comments)    Severe nausea, upper abdominal discomfort    Review of Systems   Constitutional:  Positive for fatigue and fever. Negative for chills.  HENT:  Positive for congestion, postnasal drip, rhinorrhea and sneezing. Negative for ear pain, sinus pressure, sinus pain and sore throat.   Respiratory:  Positive for cough. Negative for shortness of breath.   Cardiovascular:  Negative for chest pain.  Gastrointestinal:  Negative for diarrhea, nausea and vomiting.  Neurological:  Negative for dizziness and headaches.       Objective:        02/03/2023    9:25 AM 01/06/2023  7:27 AM 11/22/2022    2:47 PM  Vitals with BMI  Height 5\' 9"  5\' 9"  5\' 9"   Weight 277 lbs 270 lbs 10 oz 273 lbs  BMI 40.89 39.94 40.3  Systolic 136 138 161  Diastolic 80 72 70  Pulse 59 56 60    No data found.   Physical Exam Constitutional:      General: He is not in acute distress.    Appearance: He is ill-appearing.  HENT:     Right Ear: Tympanic membrane is erythematous.     Left Ear: Tympanic membrane is erythematous.     Nose: Congestion and rhinorrhea present. Rhinorrhea is clear.     Right Turbinates: Swollen.     Left Turbinates: Swollen.     Mouth/Throat:     Pharynx: Posterior oropharyngeal erythema present.  Cardiovascular:     Rate and Rhythm: Normal rate and regular rhythm.     Heart sounds: Normal heart sounds.  Pulmonary:     Effort: No respiratory distress.     Breath sounds: Normal breath sounds. No wheezing or rhonchi.  Skin:    General: Skin is warm.  Neurological:     Mental Status: He is alert.  Psychiatric:        Mood and Affect: Mood normal.        Thought Content: Thought content normal.     Health Maintenance Due  Topic Date Due   OPHTHALMOLOGY EXAM  03/10/2022   INFLUENZA VACCINE  12/29/2022    There are no preventive care reminders to display for this patient.   Lab Results  Component Value Date   TSH 7.050 (H) 01/06/2023   Lab Results  Component Value Date   WBC 5.7 01/06/2023   HGB 15.4 01/06/2023   HCT 45.3 01/06/2023    MCV 92 01/06/2023   PLT 163 01/06/2023   Lab Results  Component Value Date   NA 141 01/06/2023   K 4.6 01/06/2023   CO2 25 01/06/2023   GLUCOSE 144 (H) 01/06/2023   BUN 8 01/06/2023   CREATININE 1.01 01/06/2023   BILITOT 0.6 01/06/2023   ALKPHOS 108 01/06/2023   AST 27 01/06/2023   ALT 41 01/06/2023   PROT 6.4 01/06/2023   ALBUMIN 4.6 01/06/2023   CALCIUM 9.3 01/06/2023   ANIONGAP 7 02/11/2022   EGFR 84 01/06/2023   Lab Results  Component Value Date   CHOL 123 01/06/2023   Lab Results  Component Value Date   HDL 40 01/06/2023   Lab Results  Component Value Date   LDLCALC 62 01/06/2023   Lab Results  Component Value Date   TRIG 114 01/06/2023   Lab Results  Component Value Date   CHOLHDL 3.1 01/06/2023   Lab Results  Component Value Date   HGBA1C 6.3 (H) 01/06/2023       Assessment & Plan:  Non-recurrent acute allergic otitis media of both ears Assessment & Plan: Acute Covid - negative  1) See orders for this visit as documented in the electronic medical record. 2) Symptomatic therapy suggested: use ibuprofen, antihistamine-decongestant of choice, cough suppressant of choice prn.  Samples given for allegra, Mucinex and Astepro for symptoms. 3) Increase water intake 4) Call or return to clinic prn if these symptoms worsen or fail to improve as anticipated.   Orders: -     Amoxicillin-Pot Clavulanate; Take 1 tablet by mouth 2 (two) times daily.  Dispense: 20 tablet; Refill: 0  Acute cough Assessment & Plan: Covid - negative  Orders: -     POC COVID-19 BinaxNow     Meds ordered this encounter  Medications   amoxicillin-clavulanate (AUGMENTIN) 875-125 MG tablet    Sig: Take 1 tablet by mouth 2 (two) times daily.    Dispense:  20 tablet    Refill:  0    Orders Placed This Encounter  Procedures   POC COVID-19 BinaxNow     Follow-up: Return if symptoms worsen or fail to improve.  An After Visit Summary was printed and given to the  patient.  Renne Crigler, FNP [facility] [phone]

## 2023-02-03 NOTE — Assessment & Plan Note (Signed)
Covid negative

## 2023-02-03 NOTE — Assessment & Plan Note (Addendum)
Acute Covid - negative  1) See orders for this visit as documented in the electronic medical record. 2) Symptomatic therapy suggested: use ibuprofen, antihistamine-decongestant of choice, cough suppressant of choice prn.  Samples given for allegra, Mucinex and Astepro for symptoms. 3) Increase water intake 4) Call or return to clinic prn if these symptoms worsen or fail to improve as anticipated.

## 2023-02-14 ENCOUNTER — Other Ambulatory Visit: Payer: Self-pay

## 2023-02-14 DIAGNOSIS — E038 Other specified hypothyroidism: Secondary | ICD-10-CM

## 2023-02-17 ENCOUNTER — Other Ambulatory Visit: Payer: Managed Care, Other (non HMO)

## 2023-02-17 DIAGNOSIS — E038 Other specified hypothyroidism: Secondary | ICD-10-CM

## 2023-02-18 LAB — TSH: TSH: 2.17 u[IU]/mL (ref 0.450–4.500)

## 2023-02-21 ENCOUNTER — Other Ambulatory Visit: Payer: Self-pay

## 2023-02-21 MED ORDER — LEVOTHYROXINE SODIUM 137 MCG PO TABS: 137.0000 ug | ORAL_TABLET | Freq: Every day | ORAL | 3 refills | Status: DC

## 2023-03-10 ENCOUNTER — Other Ambulatory Visit: Payer: Self-pay

## 2023-03-13 ENCOUNTER — Other Ambulatory Visit: Payer: Self-pay

## 2023-03-13 DIAGNOSIS — I252 Old myocardial infarction: Secondary | ICD-10-CM

## 2023-03-13 DIAGNOSIS — E1159 Type 2 diabetes mellitus with other circulatory complications: Secondary | ICD-10-CM

## 2023-03-13 DIAGNOSIS — I119 Hypertensive heart disease without heart failure: Secondary | ICD-10-CM

## 2023-03-13 MED ORDER — TELMISARTAN 80 MG PO TABS: 80.0000 mg | ORAL_TABLET | Freq: Every day | ORAL | 3 refills | Status: DC

## 2023-03-15 ENCOUNTER — Other Ambulatory Visit: Payer: Self-pay

## 2023-03-15 DIAGNOSIS — I252 Old myocardial infarction: Secondary | ICD-10-CM

## 2023-03-15 DIAGNOSIS — I152 Hypertension secondary to endocrine disorders: Secondary | ICD-10-CM

## 2023-03-15 DIAGNOSIS — I119 Hypertensive heart disease without heart failure: Secondary | ICD-10-CM

## 2023-03-15 MED ORDER — TELMISARTAN 80 MG PO TABS: 80.0000 mg | ORAL_TABLET | Freq: Every day | ORAL | 1 refills | Status: DC

## 2023-05-07 NOTE — Progress Notes (Unsigned)
Subjective:  Patient ID: Gerald Jordan, male    DOB: July 19, 1960  Age: 62 y.o. MRN: [id number]  Chief Complaint  Patient presents with   Medical Management of Chronic Issues    HPI   Diabetes:  Complications: CAD Glucose checking: once in a while.  Glucose logs:110-130. Hypoglycemia:no Most recent A1C: 6.3 Current medications: none Last Eye Exam: utd Foot checks: daily  Hyperlipidemia: Current medications: lipitor 80 mg once daily  Hypertension: Current medications:telmisartan 80 mg once daily, coreg 6.25 mg one twice daily.  Diet: healthy.  Exercise: very active  Hypothyroidism: on synthroid 125 mcg once daily in am.   Bipolar: lamictal 150 mg twice daily.    Coronary artery disease: Patient is currently on Lipitor, carvedilol, and aspirin.  Obesity: Currently on Contrave.       01/06/2023    7:30 AM 10/20/2022    8:50 AM 12/09/2021    8:38 AM 06/10/2021    9:24 AM 07/27/2020    2:17 PM  Depression screen PHQ 2/9  Decreased Interest 0 0 0 0 0  Down, Depressed, Hopeless 0 0 0 0 0  PHQ - 2 Score 0 0 0 0 0  Altered sleeping 0 0 0    Tired, decreased energy 0 1 0    Change in appetite 0 3 0    Feeling bad or failure about yourself  0 0 0    Trouble concentrating 0 0 0    Moving slowly or fidgety/restless 0 0 0    Suicidal thoughts 0 0 0    PHQ-9 Score 0 4 0    Difficult doing work/chores Not difficult at all Not difficult at all Not difficult at all          01/06/2023    7:30 AM  Fall Risk   Falls in the past year? 0  Number falls in past yr: 0  Injury with Fall? 0  Risk for fall due to : No Fall Risks  Follow up Falls evaluation completed;Follow up appointment    Patient Care Team: Aily Tzeng, Fritzi Mandes, MD as PCP - General (Family Medicine)   Review of Systems  Current Outpatient Medications on File Prior to Visit  Medication Sig Dispense Refill   amoxicillin-clavulanate (AUGMENTIN) 875-125 MG tablet Take 1 tablet by mouth 2 (two) times daily. 20 tablet  0   aspirin 81 MG tablet Take 81 mg by mouth daily.     atorvastatin (LIPITOR) 80 MG tablet Take 1 tablet (80 mg total) by mouth daily. 90 tablet 3   carvedilol (COREG) 6.25 MG tablet TAKE 1 TABLET TWICE A DAY 180 tablet 3   citalopram (CELEXA) 20 MG tablet TAKE 1 TABLET DAILY 90 tablet 3   lamoTRIgine (LAMICTAL) 150 MG tablet TAKE 2 TABLETS DAILY AS DIRECTED BY YOUR PRESCRIBER 180 tablet 3   latanoprost (XALATAN) 0.005 % ophthalmic solution Place 1 drop into both eyes nightly. 2.5 mL 2   levothyroxine (SYNTHROID) 137 MCG tablet Take 1 tablet (137 mcg total) by mouth daily before breakfast. 90 tablet 3   Naltrexone-buPROPion HCl ER (CONTRAVE) 8-90 MG TB12 once daily in the morning for week 1; then 1 tablet twice daily, morning and evening, for week 2; then 2 tablets in the morning and 1 tablet in the evening for week 3, Maintenance (week 4 and thereafter), 2 tablets orally twice daily. 360 tablet 0   nitroGLYCERIN (NITROSTAT) 0.4 MG SL tablet Place 1 tablet (0.4 mg total) under the tongue every 5 (five) minutes  as needed for chest pain. 25 tablet 0   telmisartan (MICARDIS) 80 MG tablet Take 1 tablet (80 mg total) by mouth daily. 90 tablet 1   No current facility-administered medications on file prior to visit.   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   Type 2 diabetes  mellitus (HCC)    Unspecified hearing loss, bilateral    Ventricular fibrillation [facility])    Past Surgical History:  Procedure Laterality Date   ANTERIOR CERVICAL DECOMP/DISCECTOMY FUSION N/A 02/17/2022   Procedure: ANTERIOR CERVICAL DECOMPRESSION/DISCECTOMY FUSION  CERVICAL FIVE-SEVEN;  Surgeon: Venita Lick, MD;  Location: [facility] OR;  Service: Orthopedics;  Laterality: N/A;   arm surgery  05/2014   CARDIAC CATHETERIZATION  2019   one stent placed   CHOLECYSTECTOMY  02/2016   COLONOSCOPY  2022   HERNIA REPAIR     1999-umbilical   NASAL SINUS SURGERY  2000   tendon repair 11/2014      Family History  Problem Relation Age of Onset   Breast cancer Mother    Melanoma Father    Diabetes Sister    Arrhythmia Brother    Congestive Heart Failure Brother    Heart attack Brother    Atrial fibrillation Brother    Social History   Socioeconomic History   Marital status: Married    Spouse name: Not on file   Number of children: 1   Years of education: Not on file   Highest education level: Not on file  Occupational History   Occupation: Production designer, theatre/television/film  Tobacco Use   Smoking status: Never   Smokeless tobacco: Never  Vaping Use   Vaping status: Never Used  Substance and Sexual Activity   Alcohol use: Not Currently   Drug use: No   Sexual activity: Yes    Partners: Female  Other Topics Concern   Not on file  Social History Narrative   Not on file   Social Determinants of Health   Financial Resource Strain: Low Risk  (02/03/2023)   Overall Financial Resource Strain (CARDIA)    Difficulty of Paying Living Expenses: Not hard at all  Food Insecurity: No Food Insecurity (02/03/2023)   Hunger Vital Sign    Worried About Running Out of Food in the Last Year: Never true    Ran Out of Food in the Last Year: Never true  Transportation Needs: No Transportation Needs (02/03/2023)   PRAPARE - Administrator, Civil Service (Medical): No    Lack of Transportation (Non-Medical): No   Physical Activity: Insufficiently Active (02/03/2023)   Exercise Vital Sign    Days of Exercise per Week: 2 days    Minutes of Exercise per Session: 40 min  Stress: No Stress Concern Present (02/03/2023)   [employer] of Occupational  Health - Occupational Stress Questionnaire    Feeling of Stress : Not at all  Social Connections: Moderately Isolated (02/03/2023)   Social Connection and Isolation Panel [NHANES]    Frequency of Communication with Friends and Family: More than three times a week    Frequency of Social Gatherings with Friends and Family: More than three times a week    Attends Religious Services: Never    Database administrator or Organizations: No    Attends Banker Meetings: Never    Marital Status: Married    Objective:  There were no vitals taken for this visit.     02/03/2023    9:25 AM 01/06/2023    7:27 AM 11/22/2022    2:47 PM  BP/Weight  Systolic BP 136 138 126  Diastolic BP 80 72 70  Wt. (Lbs) 277 270.6 273  BMI 40.91 kg/m2 39.96 kg/m2 40.32 kg/m2    Physical Exam  Diabetic Foot Exam - Simple   No data filed      Lab Results  Component Value Date   WBC 5.7 01/06/2023   HGB 15.4 01/06/2023   HCT 45.3 01/06/2023   PLT 163 01/06/2023   GLUCOSE 144 (H) 01/06/2023   CHOL 123 01/06/2023   TRIG 114 01/06/2023   HDL 40 01/06/2023   LDLCALC 62 01/06/2023   ALT 41 01/06/2023   AST 27 01/06/2023   NA 141 01/06/2023   K 4.6 01/06/2023   CL 101 01/06/2023   CREATININE 1.01 01/06/2023   BUN 8 01/06/2023   CO2 25 01/06/2023   TSH 2.170 02/17/2023   HGBA1C 6.3 (H) 01/06/2023   MICROALBUR 30 07/29/2020      Assessment & Plan:    There are no diagnoses linked to this encounter.   No orders of the defined types were placed in this encounter.   No orders of the defined types were placed in this encounter.    Follow-up: No follow-ups on file.   I,Marla I Leal-Borjas,acting as a scribe for Blane Ohara, MD.,have documented all  relevant documentation on the behalf of Blane Ohara, MD,as directed by  Blane Ohara, MD while in the presence of Blane Ohara, MD.   An After Visit Summary was printed and given to the patient.  Blane Ohara, MD [facility] [phone]

## 2023-05-08 ENCOUNTER — Encounter: Payer: Self-pay | Admitting: Family Medicine

## 2023-05-08 ENCOUNTER — Ambulatory Visit: Payer: Managed Care, Other (non HMO) | Admitting: Family Medicine

## 2023-05-08 VITALS — BP 132/78 | HR 62 | Temp 98.0°F | Resp 16 | Ht 69.0 in | Wt 278.1 lb

## 2023-05-08 DIAGNOSIS — J0100 Acute maxillary sinusitis, unspecified: Secondary | ICD-10-CM

## 2023-05-08 DIAGNOSIS — Z6841 Body Mass Index (BMI) 40.0 and over, adult: Secondary | ICD-10-CM

## 2023-05-08 DIAGNOSIS — I25118 Atherosclerotic heart disease of native coronary artery with other forms of angina pectoris: Secondary | ICD-10-CM

## 2023-05-08 DIAGNOSIS — E039 Hypothyroidism, unspecified: Secondary | ICD-10-CM

## 2023-05-08 DIAGNOSIS — E782 Mixed hyperlipidemia: Secondary | ICD-10-CM

## 2023-05-08 DIAGNOSIS — I119 Hypertensive heart disease without heart failure: Secondary | ICD-10-CM

## 2023-05-08 DIAGNOSIS — E1121 Type 2 diabetes mellitus with diabetic nephropathy: Secondary | ICD-10-CM | POA: Diagnosis not present

## 2023-05-08 DIAGNOSIS — E662 Morbid (severe) obesity with alveolar hypoventilation: Secondary | ICD-10-CM

## 2023-05-08 DIAGNOSIS — E66813 Obesity, class 3: Secondary | ICD-10-CM | POA: Insufficient documentation

## 2023-05-08 MED ORDER — AZITHROMYCIN 250 MG PO TABS: ORAL_TABLET | ORAL | 0 refills | Status: DC

## 2023-05-08 MED ORDER — FLUTICASONE PROPIONATE 50 MCG/ACT NA SUSP: 2.0000 | Freq: Every day | NASAL | 6 refills | Status: DC

## 2023-05-08 MED ORDER — TIRZEPATIDE 2.5 MG/0.5ML ~~LOC~~ SOAJ: 2.5000 mg | SUBCUTANEOUS | 0 refills | Status: DC

## 2023-05-08 NOTE — Assessment & Plan Note (Signed)
Well controlled.  No changes to medicines. Telmisartan 80 mg once daily, coreg 6.25 mg one twice daily.  Continue to work on eating a healthy diet and exercise.  Labs drawn today.

## 2023-05-08 NOTE — Patient Instructions (Signed)
Start on mounjaro 2.5 mg weekly. Call in 3 weeks if tolerating and I will increase dose.

## 2023-05-08 NOTE — Assessment & Plan Note (Signed)
Control: Well controlled Recommend check feet daily. Recommend annual eye exams. Medicines: start mounjaro 2.5 mg weekly Continue to work on eating a healthy diet and exercise.  Labs drawn today.

## 2023-05-08 NOTE — Assessment & Plan Note (Signed)
Previously well controlled Continue Synthroid at current dose  Recheck TSH and adjust Synthroid as indicated   

## 2023-05-08 NOTE — Assessment & Plan Note (Signed)
Well-controlled. Continue carvedilol, aspirin, Lipitor. Follow-up with cardiology.

## 2023-05-08 NOTE — Assessment & Plan Note (Signed)
Zpack and flonase prescribed.

## 2023-05-08 NOTE — Assessment & Plan Note (Signed)
Continue lipitor 80 mg daily. Heart healthy diet. Labs drawn.

## 2023-05-08 NOTE — Assessment & Plan Note (Signed)
Start mounjaro.  Recommend continue to work on eating healthy diet and exercise.

## 2023-05-09 LAB — COMPREHENSIVE METABOLIC PANEL
ALT: 56 [IU]/L — ABNORMAL HIGH (ref 0–44)
AST: 38 [IU]/L (ref 0–40)
Albumin: 4.3 g/dL (ref 3.9–4.9)
Alkaline Phosphatase: 109 [IU]/L (ref 44–121)
BUN/Creatinine Ratio: 13 (ref 10–24)
BUN: 11 mg/dL (ref 8–27)
Bilirubin Total: 0.8 mg/dL (ref 0.0–1.2)
CO2: 23 mmol/L (ref 20–29)
Calcium: 9 mg/dL (ref 8.6–10.2)
Chloride: 100 mmol/L (ref 96–106)
Creatinine, Ser: 0.87 mg/dL (ref 0.76–1.27)
Globulin, Total: 2 g/dL (ref 1.5–4.5)
Glucose: 189 mg/dL — ABNORMAL HIGH (ref 70–99)
Potassium: 4.5 mmol/L (ref 3.5–5.2)
Sodium: 139 mmol/L (ref 134–144)
Total Protein: 6.3 g/dL (ref 6.0–8.5)
eGFR: 98 mL/min/{1.73_m2} (ref >59)

## 2023-05-09 LAB — HEMOGLOBIN A1C
Est. average glucose Bld gHb Est-mCnc: 166 mg/dL
Hgb A1c MFr Bld: 7.4 % — ABNORMAL HIGH (ref 4.8–5.6)

## 2023-05-09 LAB — CBC WITH DIFFERENTIAL/PLATELET
Basophils Absolute: 0 10*3/uL (ref 0.0–0.2)
Basos: 0 %
EOS (ABSOLUTE): 0.1 10*3/uL (ref 0.0–0.4)
Eos: 1 %
Hematocrit: 43.7 % (ref 37.5–51.0)
Hemoglobin: 14.7 g/dL (ref 13.0–17.7)
Immature Grans (Abs): 0.1 10*3/uL (ref 0.0–0.1)
Immature Granulocytes: 1 %
Lymphocytes Absolute: 1.9 10*3/uL (ref 0.7–3.1)
Lymphs: 33 %
MCH: 30.9 pg (ref 26.6–33.0)
MCHC: 33.6 g/dL (ref 31.5–35.7)
MCV: 92 fL (ref 79–97)
Monocytes Absolute: 0.5 10*3/uL (ref 0.1–0.9)
Monocytes: 9 %
Neutrophils Absolute: 3.2 10*3/uL (ref 1.4–7.0)
Neutrophils: 56 %
Platelets: 182 10*3/uL (ref 150–450)
RBC: 4.76 x10E6/uL (ref 4.14–5.80)
RDW: 12.2 % (ref 11.6–15.4)
WBC: 5.8 10*3/uL (ref 3.4–10.8)

## 2023-05-09 LAB — TSH: TSH: 1.48 u[IU]/mL (ref 0.450–4.500)

## 2023-05-09 LAB — LIPID PANEL
Chol/HDL Ratio: 2.9 {ratio} (ref 0.0–5.0)
Cholesterol, Total: 110 mg/dL (ref 100–199)
HDL: 38 mg/dL — ABNORMAL LOW (ref >39)
LDL Chol Calc (NIH): 54 mg/dL (ref 0–99)
Triglycerides: 92 mg/dL (ref 0–149)
VLDL Cholesterol Cal: 18 mg/dL (ref 5–40)

## 2023-05-09 LAB — T4, FREE: Free T4: 0.99 ng/dL (ref 0.82–1.77)

## 2023-05-23 ENCOUNTER — Encounter: Payer: Self-pay | Admitting: Family Medicine

## 2023-05-23 NOTE — Telephone Encounter (Signed)
 Care team updated and letter sent for eye exam notes.

## 2023-06-05 ENCOUNTER — Other Ambulatory Visit: Payer: Self-pay

## 2023-06-05 MED ORDER — LANCETS MISC. MISC: 1.0000 | Freq: Three times a day (TID) | 11 refills | Status: DC

## 2023-06-05 MED ORDER — BLOOD GLUCOSE MONITORING SUPPL DEVI: 1.0000 | Freq: Three times a day (TID) | 0 refills | Status: AC

## 2023-06-05 MED ORDER — LANCET DEVICE MISC
1.0000 | Freq: Three times a day (TID) | 0 refills | Status: AC
Start: 2023-06-05 — End: 2023-07-05

## 2023-06-05 MED ORDER — BLOOD GLUCOSE TEST VI STRP
1.0000 | ORAL_STRIP | Freq: Three times a day (TID) | 11 refills | Status: DC
Start: 2023-06-05 — End: 2024-07-09

## 2023-06-05 NOTE — Telephone Encounter (Signed)
 Copied from CRM [id number]. Topic: Clinical - Prescription Issue >> Jun 05, 2023 11:41 AM Latavia C wrote: Reason for CRM: Patient is needing additional info on getting a script started for his DME supplies: One touch ultra 2 patient needs monitor, test strips and also possible an activator. Patient would like a call back [phone].

## 2023-06-06 ENCOUNTER — Other Ambulatory Visit: Payer: Self-pay

## 2023-06-06 DIAGNOSIS — I119 Hypertensive heart disease without heart failure: Secondary | ICD-10-CM

## 2023-06-06 DIAGNOSIS — J0100 Acute maxillary sinusitis, unspecified: Secondary | ICD-10-CM

## 2023-06-06 DIAGNOSIS — I152 Hypertension secondary to endocrine disorders: Secondary | ICD-10-CM

## 2023-06-06 DIAGNOSIS — I252 Old myocardial infarction: Secondary | ICD-10-CM

## 2023-06-06 DIAGNOSIS — E782 Mixed hyperlipidemia: Secondary | ICD-10-CM

## 2023-06-06 DIAGNOSIS — F330 Major depressive disorder, recurrent, mild: Secondary | ICD-10-CM

## 2023-06-06 DIAGNOSIS — I25118 Atherosclerotic heart disease of native coronary artery with other forms of angina pectoris: Secondary | ICD-10-CM

## 2023-06-06 MED ORDER — CITALOPRAM HYDROBROMIDE 20 MG PO TABS: 20.0000 mg | ORAL_TABLET | Freq: Every day | ORAL | 3 refills | Status: DC

## 2023-06-06 MED ORDER — LANCETS MISC. MISC: 1.0000 | Freq: Three times a day (TID) | 11 refills | Status: AC

## 2023-06-06 MED ORDER — ATORVASTATIN CALCIUM 80 MG PO TABS: 80.0000 mg | ORAL_TABLET | Freq: Every day | ORAL | 3 refills | Status: DC

## 2023-06-06 MED ORDER — LEVOTHYROXINE SODIUM 137 MCG PO TABS: 137.0000 ug | ORAL_TABLET | Freq: Every day | ORAL | 3 refills | Status: DC

## 2023-06-06 MED ORDER — BLOOD GLUCOSE TEST VI STRP: 1.0000 | ORAL_STRIP | Freq: Three times a day (TID) | 11 refills | Status: AC

## 2023-06-06 MED ORDER — TELMISARTAN 80 MG PO TABS: 80.0000 mg | ORAL_TABLET | Freq: Every day | ORAL | 1 refills | Status: DC

## 2023-06-06 MED ORDER — FLUTICASONE PROPIONATE 50 MCG/ACT NA SUSP: 2.0000 | Freq: Every day | NASAL | 6 refills | Status: AC

## 2023-06-06 MED ORDER — CARVEDILOL 6.25 MG PO TABS: 6.2500 mg | ORAL_TABLET | Freq: Two times a day (BID) | ORAL | 3 refills | Status: DC

## 2023-06-06 NOTE — Telephone Encounter (Signed)
 Copied from CRM [id number]. Topic: Clinical - Prescription Issue >> Jun 06, 2023  9:29 AM Wyona SQUIBB wrote: Reason for CRM: Pt has a new pharmacy with [facility]/ [facility]. Pt is wanting to send any medication refills or new prescription to these pharmacies. Customer callback [phone]

## 2023-06-12 NOTE — Telephone Encounter (Signed)
 Copied from CRM [id number]. Topic: Clinical - Prescription Issue >> Jun 12, 2023  1:18 PM Quynette C wrote: Reason for CRM: Patient called in stating the medication lamoTRIgine  (LAMICTAL ) 150 MG tablet was not included in the new shipping address of below. Patient does not have further information on the home delivery service states the Physician has all the details. Can you please look into this and have this medication sent please. Thank you patient can be contacted at [phone].  [facility]

## 2023-06-20 ENCOUNTER — Other Ambulatory Visit: Payer: Self-pay

## 2023-06-20 DIAGNOSIS — F330 Major depressive disorder, recurrent, mild: Secondary | ICD-10-CM

## 2023-06-20 MED ORDER — TIRZEPATIDE 5 MG/0.5ML ~~LOC~~ SOAJ: 5.0000 mg | SUBCUTANEOUS | 0 refills | Status: DC

## 2023-06-20 MED ORDER — LAMOTRIGINE 150 MG PO TABS: 300.0000 mg | ORAL_TABLET | Freq: Every day | ORAL | 3 refills | Status: DC

## 2023-06-20 NOTE — Telephone Encounter (Signed)
Patient need refill on mounjaro and is ready to go to next does, stated he is not having any issues with medication

## 2023-06-21 ENCOUNTER — Other Ambulatory Visit: Payer: Self-pay | Admitting: Family Medicine

## 2023-06-21 DIAGNOSIS — F330 Major depressive disorder, recurrent, mild: Secondary | ICD-10-CM

## 2023-06-21 MED ORDER — LAMOTRIGINE 150 MG PO TABS: 300.0000 mg | ORAL_TABLET | Freq: Every day | ORAL | 3 refills | Status: DC

## 2023-06-21 MED ORDER — TIRZEPATIDE 5 MG/0.5ML ~~LOC~~ SOAJ: 5.0000 mg | SUBCUTANEOUS | 0 refills | Status: DC

## 2023-07-21 ENCOUNTER — Telehealth: Payer: Self-pay

## 2023-07-21 NOTE — Telephone Encounter (Signed)
THIS WAS PUT IN [facility]. [facility] BOX

## 2023-07-26 ENCOUNTER — Ambulatory Visit: Payer: Managed Care, Other (non HMO) | Admitting: Physician Assistant

## 2023-07-26 VITALS — BP 132/88 | HR 60 | Temp 97.6°F | Ht 69.0 in | Wt 267.2 lb

## 2023-07-26 DIAGNOSIS — R3121 Asymptomatic microscopic hematuria: Secondary | ICD-10-CM

## 2023-07-26 DIAGNOSIS — K521 Toxic gastroenteritis and colitis: Secondary | ICD-10-CM | POA: Diagnosis not present

## 2023-07-26 DIAGNOSIS — N3289 Other specified disorders of bladder: Secondary | ICD-10-CM | POA: Diagnosis not present

## 2023-07-26 DIAGNOSIS — N200 Calculus of kidney: Secondary | ICD-10-CM

## 2023-07-26 LAB — POCT URINALYSIS DIP (CLINITEK)
Bilirubin, UA: NEGATIVE
Blood, UA: NEGATIVE
Glucose, UA: NEGATIVE mg/dL
Ketones, POC UA: NEGATIVE mg/dL
Leukocytes, UA: NEGATIVE
Nitrite, UA: NEGATIVE
POC PROTEIN,UA: NEGATIVE
Spec Grav, UA: 1.025 (ref 1.010–1.025)
Urobilinogen, UA: 0.2 U/dL
pH, UA: 6 (ref 5.0–8.0)

## 2023-07-26 NOTE — Progress Notes (Signed)
 Subjective:  Patient ID: Gerald Jordan, male    DOB: 29-Jun-1960  Age: 63 y.o. MRN: [id number]  Chief Complaint  Patient presents with   Hospitalization Follow-up    HPI   Patient is here today for a hospital follow up and states they took him off Mounjaro. Patient states he has had diarrhea .  Discussed the use of AI scribe software for clinical note transcription with the patient, who gave verbal consent to proceed.  History of Present Illness   The patient, with a history of diabetes, presents with excessive diarrhea, having fifteen plus bowel movements a day. The diarrhea started shortly after increasing the dose of Mounjaro to 5mg . The patient became excessively dehydrated and was admitted to the hospital for IV fluid rehydration. Blood work at the hospital revealed very low magnesium levels, likely secondary to the excessive diarrhea, and the patient was started on supplemental magnesium.  A CT of the abdomen revealed a 2mm kidney stone in the right kidney, which the patient states has been present for years without causing issues. The patient has a history of kidney stones since the age of 1 and has previously been treated by a urologist. The CT also showed moderate severity diffuse urinary bladder wall thickening with a mild amount of adjacent anterior inflammatory fat stranding, which may represent sequela associated with acute cystitis. A urinalysis done in the hospital showed red blood cells but no signs of white blood cells or bacteria.  However, due to the recent bout of diarrhea, the patient has discontinued the use of Mounjaro until symptoms resolve.          05/08/2023    8:13 AM 01/06/2023    7:30 AM 10/20/2022    8:50 AM 12/09/2021    8:38 AM 06/10/2021    9:24 AM  Depression screen PHQ 2/9  Decreased Interest 0 0 0 0 0  Down, Depressed, Hopeless 0 0 0 0 0  PHQ - 2 Score 0 0 0 0 0  Altered sleeping 0 0 0 0   Tired, decreased energy 1 0 1 0   Change in appetite 1  0 3 0   Feeling bad or failure about yourself  0 0 0 0   Trouble concentrating 0 0 0 0   Moving slowly or fidgety/restless 0 0 0 0   Suicidal thoughts 0 0 0 0   PHQ-9 Score 2 0 4 0   Difficult doing work/chores Not difficult at all Not difficult at all Not difficult at all Not difficult at all         05/08/2023    8:13 AM  Fall Risk   Falls in the past year? 0  Number falls in past yr: 0  Injury with Fall? 0  Risk for fall due to : No Fall Risks  Follow up Falls evaluation completed    Patient Care Team: Blane Ohara, MD as PCP - General (Family Medicine) Ablott, Claris Gower (Optometry)   Review of Systems  Constitutional:  Negative for chills, fatigue and fever.  HENT:  Negative for congestion, ear pain, sinus pressure and sore throat.   Respiratory:  Negative for cough.   Cardiovascular:  Negative for chest pain.  Gastrointestinal:  Positive for abdominal pain and diarrhea. Negative for constipation, nausea and vomiting.  Genitourinary:  Negative for dysuria and frequency.  Musculoskeletal:  Negative for arthralgias, back pain and myalgias.  Neurological:  Negative for dizziness and headaches.  Psychiatric/Behavioral:  Negative for dysphoric mood. The  patient is not nervous/anxious.     Current Outpatient Medications on File Prior to Visit  Medication Sig Dispense Refill   aspirin 81 MG tablet Take 81 mg by mouth daily.     atorvastatin (LIPITOR) 80 MG tablet Take 1 tablet (80 mg total) by mouth daily. 90 tablet 3   Blood Glucose Monitoring Suppl DEVI 1 each by Does not apply route in the morning, at noon, and at bedtime. May substitute to any manufacturer covered by patient's insurance. 1 each 0   carvedilol (COREG) 6.25 MG tablet Take 1 tablet (6.25 mg total) by mouth 2 (two) times daily. 180 tablet 3   citalopram (CELEXA) 20 MG tablet Take 1 tablet (20 mg total) by mouth daily. 90 tablet 3   fluticasone (FLONASE) 50 MCG/ACT nasal spray Place 2 sprays into both nostrils  daily. 16 g 6   Glucose Blood (BLOOD GLUCOSE TEST STRIPS) STRP 1 each by In Vitro route in the morning, at noon, and at bedtime. May substitute to any manufacturer covered by patient's insurance. 100 each 11   lamoTRIgine (LAMICTAL) 150 MG tablet Take 2 tablets (300 mg total) by mouth daily. 180 tablet 3   levothyroxine (SYNTHROID) 137 MCG tablet Take 1 tablet (137 mcg total) by mouth daily before breakfast. 90 tablet 3   nitroGLYCERIN (NITROSTAT) 0.4 MG SL tablet Place 1 tablet (0.4 mg total) under the tongue every 5 (five) minutes as needed for chest pain. 25 tablet 0   telmisartan (MICARDIS) 80 MG tablet Take 1 tablet (80 mg total) by mouth daily. 90 tablet 1   latanoprost (XALATAN) 0.005 % ophthalmic solution Place 1 drop into both eyes nightly. 2.5 mL 2   No current facility-administered medications on file prior to visit.   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   Type 2 diabetes mellitus (HCC)    Unspecified hearing loss, bilateral    Ventricular fibrillation [facility])    Past Surgical History:  Procedure Laterality Date   ANTERIOR CERVICAL DECOMP/DISCECTOMY FUSION N/A 02/17/2022   Procedure: ANTERIOR CERVICAL DECOMPRESSION/DISCECTOMY FUSION  CERVICAL FIVE-SEVEN;  Surgeon: Venita Lick, MD;  Location: [facility] OR;  Service: Orthopedics;  Laterality: N/A;   arm  surgery  05/2014   CARDIAC CATHETERIZATION  2019   one stent placed   CHOLECYSTECTOMY  02/2016   COLONOSCOPY  2022   HERNIA REPAIR     1999-umbilical   NASAL SINUS SURGERY  2000   tendon repair 11/2014      Family History  Problem Relation Age of Onset   Breast cancer Mother    Melanoma Father    Diabetes Sister    Arrhythmia Brother    Congestive Heart Failure Brother    Heart attack Brother    Atrial fibrillation Brother  Social History   Socioeconomic History   Marital status: Married    Spouse name: Not on file   Number of children: 1   Years of education: Not on file   Highest education level: Not on file  Occupational History   Occupation: Production designer, theatre/television/film  Tobacco Use   Smoking status: Never   Smokeless tobacco: Never  Vaping Use   Vaping status: Never Used  Substance and Sexual Activity   Alcohol use: Not Currently   Drug use: No   Sexual activity: Yes    Partners: Female  Other Topics Concern   Not on file  Social History Narrative   Not on file   Social Drivers of Health   Financial Resource Strain: Low Risk  (02/03/2023)   Overall Financial Resource Strain (CARDIA)    Difficulty of Paying Living Expenses: Not hard at all  Food Insecurity: No Food Insecurity (02/03/2023)   Hunger Vital Sign    Worried About Running Out of Food in the Last Year: Never true    Ran Out of Food in the Last Year: Never true  Transportation Needs: No Transportation Needs (02/03/2023)   PRAPARE - Administrator, Civil Service (Medical): No    Lack of Transportation (Non-Medical): No  Physical Activity: Insufficiently Active (02/03/2023)   Exercise Vital Sign    Days of Exercise per Week: 2 days    Minutes of Exercise per Session: 40 min  Stress: No Stress Concern Present (02/03/2023)   [employer] of Occupational Health - Occupational Stress Questionnaire    Feeling of Stress : Not at all  Social Connections: Moderately Isolated (02/03/2023)   Social Connection and  Isolation Panel [NHANES]    Frequency of Communication with Friends and Family: More than three times a week    Frequency of Social Gatherings with Friends and Family: More than three times a week    Attends Religious Services: Never    Database administrator or Organizations: No    Attends Engineer, structural: Never    Marital Status: Married    Objective:  BP 132/88   Pulse 60   Temp 97.6 F (36.4 C)   Ht 5\' 9"  (1.753 m)   Wt 267 lb 3.2 oz (121.2 kg)   SpO2 98%   BMI 39.46 kg/m      07/26/2023    1:36 PM 05/08/2023    8:04 AM 02/03/2023    9:25 AM  BP/Weight  Systolic BP 132 132 136  Diastolic BP 88 78 80  Wt. (Lbs) 267.2 278.1 277  BMI 39.46 kg/m2 41.07 kg/m2 40.91 kg/m2    Physical Exam Vitals reviewed.  Constitutional:      Appearance: Normal appearance.  Cardiovascular:     Rate and Rhythm: Normal rate and regular rhythm.     Heart sounds: Normal heart sounds.  Pulmonary:     Effort: Pulmonary effort is normal.     Breath sounds: Normal breath sounds.  Abdominal:     General: Bowel sounds are normal.     Palpations: Abdomen is soft.     Tenderness: There is no abdominal tenderness.  Neurological:     Mental Status: He is alert and oriented to person, place, and time.  Psychiatric:        Mood and Affect: Mood normal.        Behavior: Behavior normal.     Diabetic Foot Exam - Simple   No data filed      Lab  Results  Component Value Date   WBC 5.8 05/08/2023   HGB 14.7 05/08/2023   HCT 43.7 05/08/2023   PLT 182 05/08/2023   GLUCOSE 189 (H) 05/08/2023   CHOL 110 05/08/2023   TRIG 92 05/08/2023   HDL 38 (L) 05/08/2023   LDLCALC 54 05/08/2023   ALT 56 (H) 05/08/2023   AST 38 05/08/2023   NA 139 05/08/2023   K 4.5 05/08/2023   CL 100 05/08/2023   CREATININE 0.87 05/08/2023   BUN 11 05/08/2023   CO2 23 05/08/2023   TSH 1.480 05/08/2023   HGBA1C 7.4 (H) 05/08/2023   MICROALBUR 30 07/29/2020      Assessment & Plan:    Diarrhea  due to drug Assessment & Plan: Recent hospitalization for excessive diarrhea and dehydration after increasing Mounjaro to 5mg . Diarrhea has resolved since discontinuing Mounjaro. -Discontinue Mounjaro until symptoms resolve. -Monitor blood sugars for potential adjustment of other diabetes medications.  Orders: -     Magnesium  Asymptomatic microscopic hematuria Assessment & Plan: Last PSA was normal in May of last year. -Check PSA today.   Orders: -     PSA -     POCT URINALYSIS DIP (CLINITEK) -     Ambulatory referral to Urology  Hypomagnesemia Assessment & Plan: Low magnesium levels identified during recent hospitalization, likely secondary to excessive diarrhea. Started on magnesium supplementation. -Check magnesium levels today.   Bladder wall thickening Assessment & Plan: Moderate severity diffuse urinary bladder wall thickening with mild anterior inflammatory fat stranding identified on recent CT. Recent urinalysis showed red blood cells but no white blood cells or bacteria. -Perform urinalysis today. -If still showing signs of blood, refer to urologist.   Nephrolithiasis Assessment & Plan: Known 2mm stone in right kidney identified on recent CT, asymptomatic. -No intervention needed at this time.      No orders of the defined types were placed in this encounter.   Orders Placed This Encounter  Procedures   Magnesium   PSA   Ambulatory referral to Urology   POCT URINALYSIS DIP (CLINITEK)       Follow-up: No follow-ups on file.   I,Candice Gribble,acting as a Neurosurgeon for [employer], PA.,have documented all relevant documentation on the behalf of Langley Gauss, PA,as directed by  Langley Gauss, PA while in the presence of Langley Gauss, [place].   An After Visit Summary was printed and given to the patient.  Langley Gauss, [place] [facility] [phone]

## 2023-07-27 LAB — MAGNESIUM: Magnesium: 2.1 mg/dL (ref 1.6–2.3)

## 2023-07-27 LAB — PSA: Prostate Specific Ag, Serum: 0.7 ng/mL (ref 0.0–4.0)

## 2023-07-28 ENCOUNTER — Encounter: Payer: Self-pay | Admitting: Physician Assistant

## 2023-07-31 ENCOUNTER — Encounter: Payer: Self-pay | Admitting: Physician Assistant

## 2023-07-31 DIAGNOSIS — N200 Calculus of kidney: Secondary | ICD-10-CM | POA: Insufficient documentation

## 2023-07-31 DIAGNOSIS — K521 Toxic gastroenteritis and colitis: Secondary | ICD-10-CM | POA: Insufficient documentation

## 2023-07-31 DIAGNOSIS — R3129 Other microscopic hematuria: Secondary | ICD-10-CM | POA: Insufficient documentation

## 2023-07-31 DIAGNOSIS — N3289 Other specified disorders of bladder: Secondary | ICD-10-CM | POA: Insufficient documentation

## 2023-07-31 DIAGNOSIS — R3121 Asymptomatic microscopic hematuria: Secondary | ICD-10-CM | POA: Insufficient documentation

## 2023-07-31 HISTORY — DX: Calculus of kidney: N20.0

## 2023-07-31 NOTE — Assessment & Plan Note (Signed)
 Low magnesium levels identified during recent hospitalization, likely secondary to excessive diarrhea. Started on magnesium supplementation. -Check magnesium levels today.

## 2023-07-31 NOTE — Assessment & Plan Note (Signed)
 Recent hospitalization for excessive diarrhea and dehydration after increasing Mounjaro to 5mg . Diarrhea has resolved since discontinuing Mounjaro. -Discontinue Mounjaro until symptoms resolve. -Monitor blood sugars for potential adjustment of other diabetes medications.

## 2023-07-31 NOTE — Assessment & Plan Note (Signed)
 Last PSA was normal in May of last year. -Check PSA today.

## 2023-07-31 NOTE — Assessment & Plan Note (Signed)
 Known 2mm stone in right kidney identified on recent CT, asymptomatic. -No intervention needed at this time.

## 2023-07-31 NOTE — Assessment & Plan Note (Signed)
 Moderate severity diffuse urinary bladder wall thickening with mild anterior inflammatory fat stranding identified on recent CT. Recent urinalysis showed red blood cells but no white blood cells or bacteria. -Perform urinalysis today. -If still showing signs of blood, refer to urologist.

## 2023-08-03 ENCOUNTER — Encounter: Payer: Self-pay | Admitting: Urology

## 2023-08-03 ENCOUNTER — Ambulatory Visit: Payer: Managed Care, Other (non HMO) | Admitting: Urology

## 2023-08-03 VITALS — BP 149/77 | HR 57 | Ht 69.0 in | Wt 260.0 lb

## 2023-08-03 DIAGNOSIS — Z87898 Personal history of other specified conditions: Secondary | ICD-10-CM

## 2023-08-03 DIAGNOSIS — R3129 Other microscopic hematuria: Secondary | ICD-10-CM

## 2023-08-03 DIAGNOSIS — N200 Calculus of kidney: Secondary | ICD-10-CM | POA: Diagnosis not present

## 2023-08-03 LAB — URINALYSIS, ROUTINE W REFLEX MICROSCOPIC
Bilirubin, UA: NEGATIVE
Ketones, UA: NEGATIVE
Leukocytes,UA: NEGATIVE
Nitrite, UA: NEGATIVE
Protein,UA: NEGATIVE
RBC, UA: NEGATIVE
Specific Gravity, UA: 1.02 (ref 1.005–1.030)
Urobilinogen, Ur: 1 mg/dL (ref 0.2–1.0)
pH, UA: 7 (ref 5.0–7.5)

## 2023-08-03 LAB — MICROSCOPIC EXAMINATION

## 2023-08-03 NOTE — Progress Notes (Signed)
 Assessment: 1. Microscopic hematuria   2. Nephrolithiasis     Plan: I personally reviewed the patient's chart including provider notes, imaging and lab results. Today I had a discussion with the patient regarding the findings of microscopic hematuria including the implications and differential diagnoses associated with it.  I also discussed recommendations for further evaluation including the rationale for upper tract imaging and cystoscopy.  I discussed the nature of these procedures including potential risk and complications.  The patient expressed an understanding of these issues. His urinalysis is negative for microscopic hematuria today. He has a very small right renal calculus which has been stable in size and location. Given the setting of his prior abnormal urinalysis with associated dehydration, I think it be reasonable to consider a repeat urinalysis in 3-4 weeks.  Chief Complaint:  Chief Complaint  Patient presents with   Hematuria    History of Present Illness:  Gerald Jordan is a 63 y.o. male who is seen in consultation from Blane Ohara, MD for evaluation of microscopic hematuria. She was recently seen at [facility] for dehydration secondary to diarrhea from [facility].  During his evaluation, laboratory testing showed evidence of microscopic hematuria. Urinalysis from 07/18/2023 showed 3-5 RBCs. PSA 2/25:  0.7 CT showed a 2 mm nonobstructing right renal calculus, poor distention of the urinary bladder with moderate bladder wall thickening and mild amount of adjacent inflammatory fat stranding. He has not had any recent flank pain, dysuria, or gross hematuria.  He does not have any lower urinary tract symptoms other than nocturia x 1. No history of tobacco use.  He has a history of nephrolithiasis and has been treated with ureteroscopy and shockwave lithotripsy in the past.  His last stone episode was approximately 10 years ago.  He was followed by  urology in [place]. CT imaging from 12/23 showed a 2 mm right renal calculus without obstruction.  Past Medical History:  Past Medical History:  Diagnosis Date   Anxiety    Atherosclerotic heart disease of native coronary artery without angina pectoris 2019   Ventricular fibrillation - cardioverted x 3 in ambulance. 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   Type 2 diabetes mellitus (HCC)    Unspecified hearing loss, bilateral    Ventricular fibrillation [facility])     Past Surgical History:  Past Surgical History:  Procedure Laterality Date   ANTERIOR CERVICAL DECOMP/DISCECTOMY FUSION N/A 02/17/2022   Procedure: ANTERIOR CERVICAL DECOMPRESSION/DISCECTOMY FUSION  CERVICAL FIVE-SEVEN;  Surgeon: Venita Lick, MD;  Location: [facility] OR;  Service: Orthopedics;  Laterality: N/A;   arm surgery  05/2014   CARDIAC CATHETERIZATION  2019   one stent placed   CHOLECYSTECTOMY  02/2016   COLONOSCOPY  2022   HERNIA REPAIR     1999-umbilical   NASAL SINUS SURGERY  2000   tendon repair 11/2014      Allergies:  Allergies  Allergen Reactions   Penicillins Nausea And Vomiting  Mouth Ulcers - tolerated Augmentin 09/2012     Lisinopril Cough   Ozempic (0.25 Or 0.5 Mg-Dose) [Semaglutide(0.25 Or 0.5mg -Dos)] Nausea Only and Other (See Comments)    Severe nausea, upper abdominal discomfort    Family History:  Family History  Problem Relation Age of Onset   Breast cancer Mother    Melanoma Father    Diabetes Sister    Arrhythmia Brother    Congestive Heart Failure  Brother    Heart attack Brother    Atrial fibrillation Brother     Social History:  Social History   Tobacco Use   Smoking status: Never   Smokeless tobacco: Never  Vaping Use   Vaping status: Never Used  Substance Use Topics   Alcohol use: Not Currently   Drug use: No    Review of symptoms:  Constitutional:  Negative for unexplained weight loss, night sweats, fever, chills ENT:  Negative for nose bleeds, sinus pain, painful swallowing CV:  Negative for chest pain, shortness of breath, exercise intolerance, palpitations, loss of consciousness Resp:  Negative for cough, wheezing, shortness of breath GI:  Negative for nausea, vomiting, diarrhea, bloody stools GU:  Positives noted in HPI; otherwise negative for gross hematuria, dysuria, urinary incontinence Neuro:  Negative for seizures, poor balance, limb weakness, slurred speech Psych:  Negative for lack of energy, depression, anxiety Endocrine:  Negative for polydipsia, polyuria, symptoms of hypoglycemia (dizziness, hunger, sweating) Hematologic:  Negative for anemia, purpura, petechia, prolonged or excessive bleeding, use of anticoagulants  Allergic:  Negative for difficulty breathing or choking as a result of exposure to anything; no shellfish allergy; no allergic response (rash/itch) to materials, foods  Physical exam: BP (!) 149/77   Pulse (!) 57   Ht 5\' 9"  (1.753 m)   Wt 260 lb (117.9 kg)   BMI 38.40 kg/m  GENERAL APPEARANCE:  Well appearing, well developed, well nourished, NAD HEENT: Atraumatic, Normocephalic, oropharynx clear. NECK: Supple without lymphadenopathy or thyromegaly. LUNGS: Clear to auscultation bilaterally. HEART: Regular Rate and Rhythm without murmurs, gallops, or rubs. ABDOMEN: Soft, non-tender, No Masses. EXTREMITIES: Moves all extremities well.  Without clubbing, cyanosis, or edema. NEUROLOGIC:  Alert and oriented x 3, normal gait, CN II-XII grossly intact.  MENTAL STATUS:  Appropriate. BACK:   Non-tender to palpation.  No CVAT SKIN:  Warm, dry and intact.    Results: U/A:  0-5 WBC, 0-2 RBC, few bacteria

## 2023-08-07 ENCOUNTER — Encounter: Payer: Self-pay | Admitting: Physician Assistant

## 2023-08-07 DIAGNOSIS — I700 Atherosclerosis of aorta: Secondary | ICD-10-CM | POA: Insufficient documentation

## 2023-08-07 DIAGNOSIS — K5790 Diverticulosis of intestine, part unspecified, without perforation or abscess without bleeding: Secondary | ICD-10-CM

## 2023-08-07 HISTORY — DX: Diverticulosis of intestine, part unspecified, without perforation or abscess without bleeding: K57.90

## 2023-08-09 NOTE — Progress Notes (Unsigned)
 Subjective:  Patient ID: Gerald Jordan, male    DOB: 30-Mar-1961  Age: 63 y.o. MRN: [id number]  Chief Complaint  Patient presents with   Medical Management of Chronic Issues    Discussed the use of AI scribe software for clinical note transcription with the patient, who gave verbal consent to proceed.  History of Present Illness   The patient, with a history of diabetes, presents after a recent hospitalization due to severe diarrhea and dehydration. The patient reports that the symptoms started after increasing the dose of Mounjaro, a medication he was taking for weight loss. The patient had previously tried Ozempic for weight loss, but it led to constipation. The patient is seeking alternatives for weight loss as he has been struggling with cravings and overeating. The patient also reports a history of anxiety, for which he is on Lamictal. The patient has a kidney stone and a history of ventricular fibrillation.        Diabetes:  Complications: CAD Glucose checking: 130-135 mg/dl Hypoglycemia:no Most recent A1C: 7.4 Current medications: none. Last Eye Exam: 03/2023. Going two times a year Foot checks: daily  Hyperlipidemia: Current medications: lipitor 80 mg once daily  Hypertension: Current medications:telmisartan 80 mg once daily, coreg 6.25 mg one twice daily.  Diet: healthy.  Exercise: very active  Hypothyroidism: on synthroid 137 mcg once daily in am.   GAD: lamictal 150 mg twice daily, citalopram 20 mg daily.  Coronary artery disease: Patient is currently on Lipitor, carvedilol, and aspirin.       08/10/2023    9:04 AM 05/08/2023    8:13 AM 01/06/2023    7:30 AM 10/20/2022    8:50 AM 12/09/2021    8:38 AM  Depression screen PHQ 2/9  Decreased Interest 0 0 0 0 0  Down, Depressed, Hopeless 0 0 0 0 0  PHQ - 2 Score 0 0 0 0 0  Altered sleeping 0 0 0 0 0  Tired, decreased energy 1 1 0 1 0  Change in appetite 0 1 0 3 0  Feeling bad or failure about yourself  0 0 0  0 0  Trouble concentrating 0 0 0 0 0  Moving slowly or fidgety/restless 0 0 0 0 0  Suicidal thoughts 0 0 0 0 0  PHQ-9 Score 1 2 0 4 0  Difficult doing work/chores Not difficult at all Not difficult at all Not difficult at all Not difficult at all Not difficult at all        05/08/2023    8:13 AM  Fall Risk   Falls in the past year? 0  Number falls in past yr: 0  Injury with Fall? 0  Risk for fall due to : No Fall Risks  Follow up Falls evaluation completed    Patient Care Team: Blane Ohara, MD as PCP - General (Family Medicine) Ablott, Claris Gower (Optometry)   Review of Systems  Constitutional:  Negative for chills, fatigue, fever and unexpected weight change.  HENT:  Negative for congestion, ear pain, sinus pain and sore throat.   Respiratory:  Negative for cough and shortness of breath.   Cardiovascular:  Negative for chest pain and palpitations.  Gastrointestinal:  Negative for abdominal pain, blood in stool, constipation, diarrhea, nausea and vomiting.  Endocrine: Negative for polydipsia.  Genitourinary:  Negative for dysuria.  Musculoskeletal:  Negative for back pain.  Skin:  Negative for rash.  Neurological:  Negative for headaches.    Current Outpatient Medications on File Prior  to Visit  Medication Sig Dispense Refill   aspirin 81 MG tablet Take 81 mg by mouth daily.     atorvastatin (LIPITOR) 80 MG tablet Take 1 tablet (80 mg total) by mouth daily. 90 tablet 3   Blood Glucose Monitoring Suppl DEVI 1 each by Does not apply route in the morning, at noon, and at bedtime. May substitute to any manufacturer covered by patient's insurance. 1 each 0   carvedilol (COREG) 6.25 MG tablet Take 1 tablet (6.25 mg total) by mouth 2 (two) times daily. 180 tablet 3   citalopram (CELEXA) 20 MG tablet Take 1 tablet (20 mg total) by mouth daily. 90 tablet 3   fluticasone (FLONASE) 50 MCG/ACT nasal spray Place 2 sprays into both nostrils daily. 16 g 6   Glucose Blood (BLOOD GLUCOSE  TEST STRIPS) STRP 1 each by In Vitro route in the morning, at noon, and at bedtime. May substitute to any manufacturer covered by patient's insurance. 100 each 11   lamoTRIgine (LAMICTAL) 150 MG tablet Take 2 tablets (300 mg total) by mouth daily. 180 tablet 3   latanoprost (XALATAN) 0.005 % ophthalmic solution Place 1 drop into both eyes nightly. 2.5 mL 2   levothyroxine (SYNTHROID) 137 MCG tablet Take 1 tablet (137 mcg total) by mouth daily before breakfast. 90 tablet 3   nitroGLYCERIN (NITROSTAT) 0.4 MG SL tablet Place 1 tablet (0.4 mg total) under the tongue every 5 (five) minutes as needed for chest pain. 25 tablet 0   telmisartan (MICARDIS) 80 MG tablet Take 1 tablet (80 mg total) by mouth daily. 90 tablet 1   No current facility-administered medications on file prior to visit.   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   Type 2 diabetes mellitus (HCC)    Unspecified hearing loss, bilateral    Ventricular fibrillation [facility])    Past Surgical History:  Procedure Laterality Date   ANTERIOR CERVICAL DECOMP/DISCECTOMY FUSION N/A 02/17/2022   Procedure: ANTERIOR CERVICAL DECOMPRESSION/DISCECTOMY FUSION  CERVICAL FIVE-SEVEN;  Surgeon: Venita Lick, MD;  Location: [facility] OR;  Service: Orthopedics;  Laterality: N/A;   arm surgery  05/2014   CARDIAC CATHETERIZATION  2019    one stent placed   CHOLECYSTECTOMY  02/2016   COLONOSCOPY  2022   HERNIA REPAIR     1999-umbilical   NASAL SINUS SURGERY  2000   tendon repair 11/2014      Family History  Problem Relation Age of Onset   Breast cancer Mother    Melanoma Father    Diabetes Sister    Arrhythmia Brother    Congestive Heart Failure Brother    Heart attack Brother    Atrial fibrillation Brother    Social History   Socioeconomic History   Marital status: Married  Spouse name: Not on file   Number of children: 1   Years of education: Not on file   Highest education level: Not on file  Occupational History   Occupation: Production designer, theatre/television/film  Tobacco Use   Smoking status: Never   Smokeless tobacco: Never  Vaping Use   Vaping status: Never Used  Substance and Sexual Activity   Alcohol use: Not Currently   Drug use: No   Sexual activity: Yes    Partners: Female  Other Topics Concern   Not on file  Social History Narrative   Not on file   Social Drivers of Health   Financial Resource Strain: Low Risk  (02/03/2023)   Overall Financial Resource Strain (CARDIA)    Difficulty of Paying Living Expenses: Not hard at all  Food Insecurity: No Food Insecurity (02/03/2023)   Hunger Vital Sign    Worried About Running Out of Food in the Last Year: Never true    Ran Out of Food in the Last Year: Never true  Transportation Needs: No Transportation Needs (02/03/2023)   PRAPARE - Administrator, Civil Service (Medical): No    Lack of Transportation (Non-Medical): No  Physical Activity: Insufficiently Active (02/03/2023)   Exercise Vital Sign    Days of Exercise per Week: 2 days    Minutes of Exercise per Session: 40 min  Stress: No Stress Concern Present (02/03/2023)   [employer] of Occupational Health - Occupational Stress Questionnaire    Feeling of Stress : Not at all  Social Connections: Moderately Isolated (02/03/2023)   Social Connection and Isolation Panel [NHANES]    Frequency of  Communication with Friends and Family: More than three times a week    Frequency of Social Gatherings with Friends and Family: More than three times a week    Attends Religious Services: Never    Database administrator or Organizations: No    Attends Engineer, structural: Never    Marital Status: Married    Objective:  BP 130/80   Pulse (!) 55   Temp 98.5 F (36.9 C)   Resp 14   Ht 5\' 9"  (1.753 m)   Wt 269 lb (122 kg)   SpO2 95%   BMI 39.72 kg/m      08/10/2023    8:13 AM 08/03/2023    9:44 AM 07/26/2023    1:36 PM  BP/Weight  Systolic BP 130 149 132  Diastolic BP 80 77 88  Wt. (Lbs) 269 260 267.2  BMI 39.72 kg/m2 38.4 kg/m2 39.46 kg/m2    Physical Exam Vitals reviewed.  Constitutional:      Appearance: Normal appearance. He is obese.  Neck:     Vascular: No carotid bruit.  Cardiovascular:     Rate and Rhythm: Normal rate and regular rhythm.     Pulses: Normal pulses.     Heart sounds: Normal heart sounds.  Pulmonary:     Effort: Pulmonary effort is normal.     Breath sounds: Normal breath sounds. No wheezing, rhonchi or rales.  Abdominal:     General: Bowel sounds are normal.     Palpations: Abdomen is soft.     Tenderness: There is no abdominal tenderness.  Neurological:     Mental Status: He is alert and oriented to person, place, and time.  Psychiatric:        Mood and Affect: Mood normal.        Behavior: Behavior normal.     Diabetic Foot  Exam - Simple   Simple Foot Form Diabetic Foot exam was performed with the following findings: Yes 08/10/2023  8:59 AM  Visual Inspection No deformities, no ulcerations, no other skin breakdown bilaterally: Yes Sensation Testing Intact to touch and monofilament testing bilaterally: Yes Pulse Check Posterior Tibialis and Dorsalis pulse intact bilaterally: Yes Comments      Lab Results  Component Value Date   WBC 5.0 08/10/2023   HGB 14.6 08/10/2023   HCT 42.3 08/10/2023   PLT 177 08/10/2023    GLUCOSE 145 (H) 08/10/2023   CHOL 112 08/10/2023   TRIG 81 08/10/2023   HDL 40 08/10/2023   LDLCALC 56 08/10/2023   ALT 43 08/10/2023   AST 33 08/10/2023   NA 140 08/10/2023   K 4.6 08/10/2023   CL 101 08/10/2023   CREATININE 0.79 08/10/2023   BUN 8 08/10/2023   CO2 24 08/10/2023   TSH 1.480 05/08/2023   HGBA1C 6.1 (H) 08/10/2023   MICROALBUR 30 07/29/2020      Assessment & Plan:  Assessment and Plan    Weight management Experienced adverse effects from Mounjaro and Ozempic. Contrave may worsen anxiety. Phentermine and Topamax contraindicated. Marcelline Deist or Jardiance considered for weight and diabetes management pending labs. - Refer to dietician or nutritionist. - Consider Marcelline Deist or Jardiance after lab review.  Diabetes Blood sugar elevated, 130-135 mg/dL. Adjustments pending lab results. Marcelline Deist or Jardiance considered for management and weight loss. - Order blood work. - Consider Marcelline Deist or Jardiance after lab review.  Hypertension Managed with telmisartan and carvedilol.  Hyperlipidemia Managed with atorvastatin.  Anxiety Managed with Lamictal and citalopram. Wellbutrin in Contrave may impact anxiety.  Kidney stone Right kidney stone under urologist care. Recent urinalysis showed no blood. - Follow up with urologist for repeat urinalysis.  General Health Maintenance Aware of need for lifestyle modifications for weight and health. - Encourage healthier meal choices and food preparation. - Advise on healthier options when eating out. - Encourage increased physical activity.  Follow-up Scheduled for follow-up appointments and tests. - Schedule follow-up after lab results. - Follow up with urologist for repeat urinalysis.         Coronary artery disease of native artery of native heart with stable angina pectoris [facility]) Assessment & Plan: Well-controlled. Continue carvedilol, aspirin, Lipitor. Follow-up with cardiology.   Hypertensive heart disease  without heart failure Assessment & Plan: Well controlled.  No changes to medicines. Telmisartan 80 mg once daily, coreg 6.25 mg one twice daily.  Continue to work on eating a healthy diet and exercise.  Labs drawn today.    Orders: -     CBC with Differential/Platelet -     Comprehensive metabolic panel  OSA on CPAP Assessment & Plan: Using CPAP.   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 Recommend annual eye exams. Medicines: none Continue to work on eating a healthy diet and exercise.  Labs drawn today.      Orders: -     Hemoglobin A1c -     Microalbumin / creatinine urine ratio  Mixed hyperlipidemia Assessment & Plan: Continue lipitor 80 mg daily. Heart healthy diet. Labs drawn.    Orders: -     Lipid panel  Encounter for immunization -     Pneumococcal conjugate vaccine 20-valent     No orders of the defined types were placed in this  encounter.   Orders Placed This Encounter  Procedures   Pneumococcal conjugate vaccine 20-valent   CBC with Differential/Platelet   Comprehensive metabolic panel   Hemoglobin A1c   Lipid panel   Microalbumin / creatinine urine ratio     Follow-up: Return in about 3 months (around 11/10/2023) for chronic follow up.  I,Marla I Leal-Borjas,acting as a scribe for Blane Ohara, MD.,have documented all relevant documentation on the behalf of Blane Ohara, MD,as directed by  Blane Ohara, MD while in the presence of Blane Ohara, MD.   An After Visit Summary was printed and given to the patient.  Blane Ohara, MD [facility] [phone]

## 2023-08-10 ENCOUNTER — Ambulatory Visit: Payer: Managed Care, Other (non HMO) | Admitting: Family Medicine

## 2023-08-10 ENCOUNTER — Encounter: Payer: Self-pay | Admitting: Family Medicine

## 2023-08-10 VITALS — BP 130/80 | HR 55 | Temp 98.5°F | Resp 14 | Ht 69.0 in | Wt 269.0 lb

## 2023-08-10 DIAGNOSIS — Z23 Encounter for immunization: Secondary | ICD-10-CM

## 2023-08-10 DIAGNOSIS — E039 Hypothyroidism, unspecified: Secondary | ICD-10-CM

## 2023-08-10 DIAGNOSIS — I119 Hypertensive heart disease without heart failure: Secondary | ICD-10-CM | POA: Diagnosis not present

## 2023-08-10 DIAGNOSIS — I25118 Atherosclerotic heart disease of native coronary artery with other forms of angina pectoris: Secondary | ICD-10-CM | POA: Diagnosis not present

## 2023-08-10 DIAGNOSIS — E1121 Type 2 diabetes mellitus with diabetic nephropathy: Secondary | ICD-10-CM

## 2023-08-10 DIAGNOSIS — G4733 Obstructive sleep apnea (adult) (pediatric): Secondary | ICD-10-CM | POA: Diagnosis not present

## 2023-08-10 DIAGNOSIS — E782 Mixed hyperlipidemia: Secondary | ICD-10-CM

## 2023-08-11 LAB — COMPREHENSIVE METABOLIC PANEL
ALT: 43 IU/L (ref 0–44)
AST: 33 IU/L (ref 0–40)
Albumin: 4.4 g/dL (ref 3.9–4.9)
Alkaline Phosphatase: 107 IU/L (ref 44–121)
BUN/Creatinine Ratio: 10 (ref 10–24)
BUN: 8 mg/dL (ref 8–27)
Bilirubin Total: 0.8 mg/dL (ref 0.0–1.2)
CO2: 24 mmol/L (ref 20–29)
Calcium: 9.1 mg/dL (ref 8.6–10.2)
Chloride: 101 mmol/L (ref 96–106)
Creatinine, Ser: 0.79 mg/dL (ref 0.76–1.27)
Globulin, Total: 1.8 g/dL (ref 1.5–4.5)
Glucose: 145 mg/dL — ABNORMAL HIGH (ref 70–99)
Potassium: 4.6 mmol/L (ref 3.5–5.2)
Sodium: 140 mmol/L (ref 134–144)
Total Protein: 6.2 g/dL (ref 6.0–8.5)
eGFR: 100 mL/min/{1.73_m2} (ref >59)

## 2023-08-11 LAB — LIPID PANEL
Chol/HDL Ratio: 2.8 ratio (ref 0.0–5.0)
Cholesterol, Total: 112 mg/dL (ref 100–199)
HDL: 40 mg/dL (ref >39)
LDL Chol Calc (NIH): 56 mg/dL (ref 0–99)
Triglycerides: 81 mg/dL (ref 0–149)
VLDL Cholesterol Cal: 16 mg/dL (ref 5–40)

## 2023-08-11 LAB — CBC WITH DIFFERENTIAL/PLATELET
Basophils Absolute: 0 10*3/uL (ref 0.0–0.2)
Basos: 0 %
EOS (ABSOLUTE): 0.1 10*3/uL (ref 0.0–0.4)
Eos: 2 %
Hematocrit: 42.3 % (ref 37.5–51.0)
Hemoglobin: 14.6 g/dL (ref 13.0–17.7)
Immature Grans (Abs): 0 10*3/uL (ref 0.0–0.1)
Immature Granulocytes: 0 %
Lymphocytes Absolute: 1.7 10*3/uL (ref 0.7–3.1)
Lymphs: 35 %
MCH: 32 pg (ref 26.6–33.0)
MCHC: 34.5 g/dL (ref 31.5–35.7)
MCV: 93 fL (ref 79–97)
Monocytes Absolute: 0.6 10*3/uL (ref 0.1–0.9)
Monocytes: 11 %
Neutrophils Absolute: 2.5 10*3/uL (ref 1.4–7.0)
Neutrophils: 52 %
Platelets: 177 10*3/uL (ref 150–450)
RBC: 4.56 x10E6/uL (ref 4.14–5.80)
RDW: 13 % (ref 11.6–15.4)
WBC: 5 10*3/uL (ref 3.4–10.8)

## 2023-08-11 LAB — HEMOGLOBIN A1C
Est. average glucose Bld gHb Est-mCnc: 128 mg/dL
Hgb A1c MFr Bld: 6.1 % — ABNORMAL HIGH (ref 4.8–5.6)

## 2023-08-11 LAB — MICROALBUMIN / CREATININE URINE RATIO
Creatinine, Urine: 149.1 mg/dL
Microalb/Creat Ratio: 7 mg/g{creat} (ref 0–29)
Microalbumin, Urine: 9.7 ug/mL

## 2023-08-13 ENCOUNTER — Encounter: Payer: Self-pay | Admitting: Family Medicine

## 2023-08-13 NOTE — Assessment & Plan Note (Signed)
 Control: Well controlled Recommend check feet daily. Recommend annual eye exams. Medicines: none Continue to work on eating a healthy diet and exercise.  Labs drawn today.

## 2023-08-13 NOTE — Assessment & Plan Note (Signed)
Well controlled.  No changes to medicines. Telmisartan 80 mg once daily, coreg 6.25 mg one twice daily.  Continue to work on eating a healthy diet and exercise.  Labs drawn today.

## 2023-08-13 NOTE — Assessment & Plan Note (Signed)
Continue lipitor 80 mg daily. Heart healthy diet. Labs drawn.

## 2023-08-13 NOTE — Assessment & Plan Note (Signed)
 Previously well controlled Continue Synthroid at current dose

## 2023-08-13 NOTE — Assessment & Plan Note (Signed)
Using CPAP 

## 2023-08-13 NOTE — Assessment & Plan Note (Signed)
Well-controlled. Continue carvedilol, aspirin, Lipitor. Follow-up with cardiology.

## 2023-08-22 NOTE — Progress Notes (Signed)
 Acute Office Visit  Subjective:    Patient ID: Gerald Jordan, male    DOB: 04/22/1961, 62 y.o.   MRN: [id number]  Chief Complaint  Patient presents with   RUQ abdominal pain with diarrhea    HPI: Patient is in today for abdominal pain  Discussed the use of AI scribe software for clinical note transcription with the patient, who gave verbal consent to proceed.  History of Present Illness   The patient, with a history of fatty liver, presents with ongoing right upper quadrant pain and recent diarrhea. The pain, described as 'uncomfortable' and 'sore,' has occurred about four times in the past month, with one episode severe enough to wake him from sleep. The pain episodes last about a minute to a minute and a half. He also reports intermittent loss of appetite, with periods of not feeling like eating, which is unusual for him. He denies any blood in his stool, but notes abnormal stools with mucus during the recent episode of diarrhea.       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   Type 2 diabetes mellitus (HCC)    Unspecified hearing loss, bilateral    Ventricular fibrillation [facility])     Past Surgical History:  Procedure Laterality Date   ANTERIOR CERVICAL DECOMP/DISCECTOMY FUSION N/A 02/17/2022   Procedure: ANTERIOR CERVICAL DECOMPRESSION/DISCECTOMY  FUSION  CERVICAL FIVE-SEVEN;  Surgeon: Venita Lick, MD;  Location: [facility] OR;  Service: Orthopedics;  Laterality: N/A;   arm surgery  05/2014   CARDIAC CATHETERIZATION  2019   one stent placed   CHOLECYSTECTOMY  02/2016   COLONOSCOPY  2022   HERNIA REPAIR     1999-umbilical   NASAL SINUS SURGERY  2000   tendon repair 11/2014      Family History  Problem Relation Age of Onset   Breast cancer Mother    Melanoma Father    Diabetes Sister    Arrhythmia Brother    Congestive Heart Failure Brother    Heart attack Brother    Atrial fibrillation Brother     Social History   Socioeconomic History   Marital status: Married    Spouse name: Not on file   Number of children: 1   Years of education: Not on file   Highest education level: Not on file  Occupational History   Occupation: Production designer, theatre/television/film  Tobacco Use   Smoking status: Never   Smokeless tobacco: Never  Vaping Use   Vaping status: Never Used  Substance and Sexual Activity   Alcohol use: Not Currently   Drug use: No   Sexual activity: Yes    Partners: Female  Other Topics Concern   Not on file  Social History Narrative   Not on file   Social Drivers of  Health   Financial Resource Strain: Low Risk  (02/03/2023)   Overall Financial Resource Strain (CARDIA)    Difficulty of Paying Living Expenses: Not hard at all  Food Insecurity: No Food Insecurity (02/03/2023)   Hunger Vital Sign    Worried About Running Out of Food in the Last Year: Never true    Ran Out of Food in the Last Year: Never true  Transportation Needs: No Transportation Needs (02/03/2023)   PRAPARE - Administrator, Civil Service (Medical): No    Lack of Transportation (Non-Medical): No  Physical Activity: Insufficiently Active (02/03/2023)   Exercise Vital Sign    Days of Exercise per Week: 2 days    Minutes of Exercise per Session: 40 min  Stress: No Stress Concern Present (02/03/2023)   [employer] of Occupational Health - Occupational Stress  Questionnaire    Feeling of Stress : Not at all  Social Connections: Moderately Isolated (02/03/2023)   Social Connection and Isolation Panel [NHANES]    Frequency of Communication with Friends and Family: More than three times a week    Frequency of Social Gatherings with Friends and Family: More than three times a week    Attends Religious Services: Never    Database administrator or Organizations: No    Attends Banker Meetings: Never    Marital Status: Married  Catering manager Violence: Not At Risk (02/03/2023)   Humiliation, Afraid, Rape, and Kick questionnaire    Fear of Current or Ex-Partner: No    Emotionally Abused: No    Physically Abused: No    Sexually Abused: No    Outpatient Medications Prior to Visit  Medication Sig Dispense Refill   aspirin 81 MG tablet Take 81 mg by mouth daily.     atorvastatin (LIPITOR) 80 MG tablet Take 1 tablet (80 mg total) by mouth daily. 90 tablet 3   Blood Glucose Monitoring Suppl DEVI 1 each by Does not apply route in the morning, at noon, and at bedtime. May substitute to any manufacturer covered by patient's insurance. 1 each 0   carvedilol (COREG) 6.25 MG tablet Take 1 tablet (6.25 mg total) by mouth 2 (two) times daily. 180 tablet 3   citalopram (CELEXA) 20 MG tablet Take 1 tablet (20 mg total) by mouth daily. 90 tablet 3   fluticasone (FLONASE) 50 MCG/ACT nasal spray Place 2 sprays into both nostrils daily. 16 g 6   Glucose Blood (BLOOD GLUCOSE TEST STRIPS) STRP 1 each by In Vitro route in the morning, at noon, and at bedtime. May substitute to any manufacturer covered by patient's insurance. 100 each 11   lamoTRIgine (LAMICTAL) 150 MG tablet Take 2 tablets (300 mg total) by mouth daily. 180 tablet 3   latanoprost (XALATAN) 0.005 % ophthalmic solution Place 1 drop into both eyes nightly. 2.5 mL 2   levothyroxine (SYNTHROID) 137 MCG tablet Take 1 tablet (137 mcg total) by mouth daily before breakfast. 90 tablet 3   nitroGLYCERIN  (NITROSTAT) 0.4 MG SL tablet Place 1 tablet (0.4 mg total) under the tongue every 5 (five) minutes as needed for chest pain. 25 tablet 0   telmisartan (MICARDIS) 80 MG tablet Take 1 tablet (80 mg total) by mouth daily. 90 tablet 1   MOUNJARO 5 MG/0.5ML Pen INJECT THE CONTENTS OF ONE PEN  SUBCUTANEOUSLY WEEKLY AS  DIRECTED 6 mL 3   No facility-administered medications prior to visit.    Allergies  Allergen Reactions   Mounjaro [Tirzepatide]  Diarrhea   Penicillins Nausea And Vomiting    Mouth Ulcers - tolerated Augmentin 09/2012     Lisinopril Cough   Ozempic (0.25 Or 0.5 Mg-Dose) [Semaglutide(0.25 Or 0.5mg -Dos)] Nausea Only and Other (See Comments)    Severe nausea, upper abdominal discomfort    Review of Systems  Constitutional:  Negative for appetite change, fatigue and fever.  HENT:  Negative for congestion, ear pain, sinus pressure and sore throat.   Respiratory:  Negative for cough, chest tightness, shortness of breath and wheezing.   Cardiovascular:  Negative for chest pain and palpitations.  Gastrointestinal:  Positive for abdominal pain (RUQ abdominal pain), diarrhea, nausea and vomiting. Negative for constipation.       Bloating/belching  Genitourinary:  Negative for dysuria and hematuria.  Musculoskeletal:  Negative for arthralgias, back pain, joint swelling and myalgias.  Skin:  Negative for rash.  Neurological:  Negative for dizziness, weakness and headaches.  Psychiatric/Behavioral:  Negative for dysphoric mood. The patient is not nervous/anxious.        Objective:        08/23/2023    9:31 AM 08/10/2023    8:13 AM 08/03/2023    9:44 AM  Vitals with BMI  Height 5\' 9"  5\' 9"  5\' 9"   Weight 269 lbs 269 lbs 260 lbs  BMI 39.71 39.71 38.38  Systolic 122 130 161  Diastolic 62 80 77  Pulse 55 55 57    No data found.    Physical Exam Vitals reviewed.  Constitutional:      Appearance: Normal appearance.  Cardiovascular:     Rate and Rhythm: Normal rate and  regular rhythm.     Heart sounds: Normal heart sounds.  Pulmonary:     Effort: Pulmonary effort is normal.     Breath sounds: Normal breath sounds.  Abdominal:     General: Bowel sounds are normal.     Palpations: Abdomen is soft.     Tenderness: There is no abdominal tenderness.  Neurological:     Mental Status: He is alert and oriented to person, place, and time.  Psychiatric:        Mood and Affect: Mood normal.        Behavior: Behavior normal.     Health Maintenance Due  Topic Date Due   OPHTHALMOLOGY EXAM  03/10/2022    There are no preventive care reminders to display for this patient.   Lab Results  Component Value Date   TSH 1.480 05/08/2023   Lab Results  Component Value Date   WBC 5.6 08/23/2023   HGB 13.6 08/23/2023   HCT 40.3 08/23/2023   MCV 91 08/23/2023   PLT 172 08/23/2023   Lab Results  Component Value Date   NA 139 08/23/2023   K 4.2 08/23/2023   CO2 24 08/23/2023   GLUCOSE 120 (H) 08/23/2023   BUN 9 08/23/2023   CREATININE 0.73 (L) 08/23/2023   BILITOT 1.1 08/23/2023   ALKPHOS 95 08/23/2023   AST 45 (H) 08/23/2023   ALT 39 08/23/2023   PROT 5.9 (L) 08/23/2023   ALBUMIN 4.3 08/23/2023   CALCIUM 8.8 08/23/2023   ANIONGAP 7 02/11/2022   EGFR 103 08/23/2023   Lab Results  Component Value Date   CHOL 112 08/10/2023   Lab Results  Component Value Date   HDL 40 08/10/2023   Lab Results  Component Value Date   LDLCALC 56 08/10/2023   Lab Results  Component Value Date   TRIG 81 08/10/2023   Lab  Results  Component Value Date   CHOLHDL 2.8 08/10/2023   Lab Results  Component Value Date   HGBA1C 6.1 (H) 08/10/2023       Assessment & Plan:  Fatty liver Assessment & Plan: Confirmed by CT. Discussed potential progression to liver scarring and role of cholesterol medications. Consideration of Repatha and stronger statin. - Refer to liver specialist. - Discuss Repatha with gastroenterology. - Consider stronger statin if  recommended.  Orders: -     Ambulatory referral to Gastroenterology -     Comprehensive metabolic panel with GFR -     CBC with Differential/Platelet -     Acute Viral Hepatitis (HAV, HBV, HCV)  RUQ pain Assessment & Plan: Intermittent pain with differential diagnosis including fatty liver disease, hepatitis, and pancreatic issues. CT showed fatty liver without tumors. - Refer to gastroenterology for evaluation and potential liver biopsy. - Order hepatitis panel. - Order complete blood count. - Order amylase and lipase.  Orders: -     Ambulatory referral to Gastroenterology -     Comprehensive metabolic panel with GFR -     CBC with Differential/Platelet -     Acute Viral Hepatitis (HAV, HBV, HCV) -     Lipase -     Amylase  Other orders -     Interpretation:      No orders of the defined types were placed in this encounter.   Orders Placed This Encounter  Procedures   Comprehensive metabolic panel   CBC with Differential/Platelet   Acute Viral Hepatitis (HAV, HBV, HCV)   Lipase   Amylase   Interpretation:   Ambulatory referral to Gastroenterology  Assessment and Plan    Follow-up: No follow-ups on file.  An After Visit Summary was printed and given to the patient.  I,Lauren M Auman,acting as a Neurosurgeon for [employer], [employer].,have documented all relevant documentation on the behalf of Langley Gauss, PA,as directed by  Langley Gauss, PA while in the presence of Langley Gauss, [place].    Langley Gauss, [place] [facility] [phone]

## 2023-08-23 ENCOUNTER — Other Ambulatory Visit: Payer: Self-pay | Admitting: Family Medicine

## 2023-08-23 ENCOUNTER — Ambulatory Visit: Admitting: Physician Assistant

## 2023-08-23 ENCOUNTER — Encounter: Payer: Self-pay | Admitting: Physician Assistant

## 2023-08-23 VITALS — BP 122/62 | HR 55 | Temp 98.2°F | Ht 69.0 in | Wt 269.0 lb

## 2023-08-23 DIAGNOSIS — K760 Fatty (change of) liver, not elsewhere classified: Secondary | ICD-10-CM | POA: Diagnosis not present

## 2023-08-23 DIAGNOSIS — R1011 Right upper quadrant pain: Secondary | ICD-10-CM | POA: Diagnosis not present

## 2023-08-24 LAB — COMPREHENSIVE METABOLIC PANEL WITH GFR
ALT: 39 IU/L (ref 0–44)
AST: 45 IU/L — ABNORMAL HIGH (ref 0–40)
Albumin: 4.3 g/dL (ref 3.9–4.9)
Alkaline Phosphatase: 95 IU/L (ref 44–121)
BUN/Creatinine Ratio: 12 (ref 10–24)
BUN: 9 mg/dL (ref 8–27)
Bilirubin Total: 1.1 mg/dL (ref 0.0–1.2)
CO2: 24 mmol/L (ref 20–29)
Calcium: 8.8 mg/dL (ref 8.6–10.2)
Chloride: 101 mmol/L (ref 96–106)
Creatinine, Ser: 0.73 mg/dL — ABNORMAL LOW (ref 0.76–1.27)
Globulin, Total: 1.6 g/dL (ref 1.5–4.5)
Glucose: 120 mg/dL — ABNORMAL HIGH (ref 70–99)
Potassium: 4.2 mmol/L (ref 3.5–5.2)
Sodium: 139 mmol/L (ref 134–144)
Total Protein: 5.9 g/dL — ABNORMAL LOW (ref 6.0–8.5)
eGFR: 103 mL/min/{1.73_m2} (ref >59)

## 2023-08-24 LAB — CBC WITH DIFFERENTIAL/PLATELET
Basophils Absolute: 0 10*3/uL (ref 0.0–0.2)
Basos: 0 %
EOS (ABSOLUTE): 0.2 10*3/uL (ref 0.0–0.4)
Eos: 4 %
Hematocrit: 40.3 % (ref 37.5–51.0)
Hemoglobin: 13.6 g/dL (ref 13.0–17.7)
Immature Grans (Abs): 0 10*3/uL (ref 0.0–0.1)
Immature Granulocytes: 0 %
Lymphocytes Absolute: 2.3 10*3/uL (ref 0.7–3.1)
Lymphs: 41 %
MCH: 30.8 pg (ref 26.6–33.0)
MCHC: 33.7 g/dL (ref 31.5–35.7)
MCV: 91 fL (ref 79–97)
Monocytes Absolute: 0.7 10*3/uL (ref 0.1–0.9)
Monocytes: 13 %
Neutrophils Absolute: 2.3 10*3/uL (ref 1.4–7.0)
Neutrophils: 42 %
Platelets: 172 10*3/uL (ref 150–450)
RBC: 4.42 x10E6/uL (ref 4.14–5.80)
RDW: 12.9 % (ref 11.6–15.4)
WBC: 5.6 10*3/uL (ref 3.4–10.8)

## 2023-08-24 LAB — HCV INTERPRETATION

## 2023-08-24 LAB — ACUTE VIRAL HEPATITIS (HAV, HBV, HCV)
HCV Ab: NONREACTIVE
Hep A IgM: NEGATIVE
Hep B C IgM: NEGATIVE
Hepatitis B Surface Ag: NEGATIVE

## 2023-08-24 LAB — LIPASE: Lipase: 37 U/L (ref 13–78)

## 2023-08-24 LAB — AMYLASE: Amylase: 50 U/L (ref 31–110)

## 2023-08-28 ENCOUNTER — Other Ambulatory Visit: Payer: Self-pay

## 2023-08-28 DIAGNOSIS — R3129 Other microscopic hematuria: Secondary | ICD-10-CM

## 2023-08-30 DIAGNOSIS — R1011 Right upper quadrant pain: Secondary | ICD-10-CM | POA: Insufficient documentation

## 2023-08-30 NOTE — Assessment & Plan Note (Signed)
 Intermittent pain with differential diagnosis including fatty liver disease, hepatitis, and pancreatic issues. CT showed fatty liver without tumors. - Refer to gastroenterology for evaluation and potential liver biopsy. - Order hepatitis panel. - Order complete blood count. - Order amylase and lipase.

## 2023-08-30 NOTE — Assessment & Plan Note (Signed)
 Confirmed by CT. Discussed potential progression to liver scarring and role of cholesterol medications. Consideration of Repatha and stronger statin. - Refer to liver specialist. - Discuss Repatha with gastroenterology. - Consider stronger statin if recommended.

## 2023-09-01 ENCOUNTER — Other Ambulatory Visit

## 2023-09-01 ENCOUNTER — Other Ambulatory Visit: Payer: Self-pay

## 2023-09-01 DIAGNOSIS — R3129 Other microscopic hematuria: Secondary | ICD-10-CM

## 2023-09-01 LAB — URINALYSIS
Bilirubin, UA: NEGATIVE
Ketones, UA: NEGATIVE
Leukocytes,UA: NEGATIVE
Nitrite, UA: NEGATIVE
Protein,UA: NEGATIVE
RBC, UA: NEGATIVE
Specific Gravity, UA: 1.023 (ref 1.005–1.030)
Urobilinogen, Ur: 0.2 mg/dL (ref 0.2–1.0)
pH, UA: 5.5 (ref 5.0–7.5)

## 2023-09-04 ENCOUNTER — Encounter: Payer: Self-pay | Admitting: Urology

## 2023-09-05 ENCOUNTER — Encounter: Payer: Self-pay | Admitting: Gastroenterology

## 2023-09-13 ENCOUNTER — Ambulatory Visit: Admitting: Gastroenterology

## 2023-09-13 ENCOUNTER — Encounter: Payer: Self-pay | Admitting: Gastroenterology

## 2023-09-13 ENCOUNTER — Other Ambulatory Visit (INDEPENDENT_AMBULATORY_CARE_PROVIDER_SITE_OTHER)

## 2023-09-13 VITALS — BP 130/80 | HR 61 | Ht 69.0 in | Wt 273.0 lb

## 2023-09-13 DIAGNOSIS — R7989 Other specified abnormal findings of blood chemistry: Secondary | ICD-10-CM | POA: Diagnosis not present

## 2023-09-13 DIAGNOSIS — R195 Other fecal abnormalities: Secondary | ICD-10-CM | POA: Diagnosis not present

## 2023-09-13 DIAGNOSIS — K760 Fatty (change of) liver, not elsewhere classified: Secondary | ICD-10-CM | POA: Diagnosis not present

## 2023-09-13 DIAGNOSIS — R1011 Right upper quadrant pain: Secondary | ICD-10-CM

## 2023-09-13 LAB — COMPREHENSIVE METABOLIC PANEL WITH GFR
ALT: 39 U/L (ref 0–53)
AST: 28 U/L (ref 0–37)
Albumin: 4.5 g/dL (ref 3.5–5.2)
Alkaline Phosphatase: 112 U/L (ref 39–117)
BUN: 7 mg/dL (ref 6–23)
CO2: 29 meq/L (ref 19–32)
Calcium: 9.1 mg/dL (ref 8.4–10.5)
Chloride: 103 meq/L (ref 96–112)
Creatinine, Ser: 0.79 mg/dL (ref 0.40–1.50)
GFR: 94.87 mL/min (ref >60.00)
Glucose, Bld: 157 mg/dL — ABNORMAL HIGH (ref 70–99)
Potassium: 3.9 meq/L (ref 3.5–5.1)
Sodium: 139 meq/L (ref 135–145)
Total Bilirubin: 0.9 mg/dL (ref 0.2–1.2)
Total Protein: 6.6 g/dL (ref 6.0–8.3)

## 2023-09-13 LAB — CBC WITH DIFFERENTIAL/PLATELET
Basophils Absolute: 0 10*3/uL (ref 0.0–0.1)
Basophils Relative: 0.3 % (ref 0.0–3.0)
Eosinophils Absolute: 0.1 10*3/uL (ref 0.0–0.7)
Eosinophils Relative: 1.4 % (ref 0.0–5.0)
HCT: 43.4 % (ref 39.0–52.0)
Hemoglobin: 14.8 g/dL (ref 13.0–17.0)
Lymphocytes Relative: 38.4 % (ref 12.0–46.0)
Lymphs Abs: 2.3 10*3/uL (ref 0.7–4.0)
MCHC: 34.2 g/dL (ref 30.0–36.0)
MCV: 91.4 fl (ref 78.0–100.0)
Monocytes Absolute: 0.7 10*3/uL (ref 0.1–1.0)
Monocytes Relative: 12.2 % — ABNORMAL HIGH (ref 3.0–12.0)
Neutro Abs: 2.8 10*3/uL (ref 1.4–7.7)
Neutrophils Relative %: 47.7 % (ref 43.0–77.0)
Platelets: 170 10*3/uL (ref 150.0–400.0)
RBC: 4.74 Mil/uL (ref 4.22–5.81)
RDW: 13.8 % (ref 11.5–15.5)
WBC: 5.9 10*3/uL (ref 4.0–10.5)

## 2023-09-13 LAB — IBC + FERRITIN
Ferritin: 161.9 ng/mL (ref 22.0–322.0)
Iron: 93 ug/dL (ref 42–165)
Saturation Ratios: 30.3 % (ref 20.0–50.0)
TIBC: 306.6 ug/dL (ref 250.0–450.0)
Transferrin: 219 mg/dL (ref 212.0–360.0)

## 2023-09-13 LAB — TSH: TSH: 4.59 u[IU]/mL (ref 0.35–5.50)

## 2023-09-13 LAB — PROTIME-INR
INR: 1.2 ratio — ABNORMAL HIGH (ref 0.8–1.0)
Prothrombin Time: 13 s (ref 9.6–13.1)

## 2023-09-13 NOTE — Progress Notes (Signed)
 Reviewed. You need to find this guy's relatively recent colonoscopy report. He must have some earthly idea where it was done. Thanks

## 2023-09-13 NOTE — Progress Notes (Signed)
 Chief Complaint: Intermittent diarrhea Primary GI MD: Gentry Fitz  HPI: 63 year old male history of diabetes, fatty liver, OSA, MI (2019), presents for evaluation of intermittent diarrhea.  Patient states since February he has had 3 bouts of diarrhea that have lasted a few days at a time.  He also has noted intermittent RUQ pain.  RUQ pain is not associated with diarrhea, eating, or movement.  He states it comes on randomly and can even come on at nighttime and wake him from his sleep.  It does not coincide with his diarrhea.  The only new medication he has started over the last few months is Mounjaro and since stopping this medication he still had at least 1 more bout of diarrhea.  Labs 08/23/2023: AST 45/ ALT 39, CBC normal, hepatitis panel negative CT abdomen pelvis with contrast for abdominal pain 07/18/2023: Diffuse fatty infiltration of liver.  S/p cholecystectomy.  Sigmoid diverticulosis.  Acute cystitis.  He reports a colonoscopy in 2022 but unsure where he got it done.  He was told to come back in 10 years.  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   Type 2 diabetes mellitus (HCC)    Unspecified hearing loss, bilateral    Ventricular fibrillation [facility])     Past Surgical History:  Procedure Laterality Date   ANTERIOR CERVICAL DECOMP/DISCECTOMY FUSION  N/A 02/17/2022   Procedure: ANTERIOR CERVICAL DECOMPRESSION/DISCECTOMY FUSION  CERVICAL FIVE-SEVEN;  Surgeon: Venita Lick, MD;  Location: [facility] OR;  Service: Orthopedics;  Laterality: N/A;   arm surgery  05/2014   CARDIAC CATHETERIZATION  2019   one stent placed   CHOLECYSTECTOMY  02/2016   COLONOSCOPY  2022   HERNIA REPAIR     1999-umbilical   NASAL SINUS SURGERY  2000   tendon repair 11/2014      Current Outpatient Medications  Medication Sig Dispense Refill   aspirin 81 MG tablet Take 81 mg by mouth daily.     atorvastatin (LIPITOR) 80 MG tablet Take 1 tablet (80 mg total) by mouth daily. 90 tablet 3   Blood Glucose Monitoring Suppl DEVI 1 each by Does not apply route in the morning, at noon, and at bedtime. May substitute to any manufacturer covered by patient's insurance. 1 each 0   carvedilol (COREG) 6.25 MG tablet Take 1 tablet (6.25 mg total) by mouth 2 (two) times daily. 180 tablet 3   citalopram (CELEXA) 20 MG tablet Take 1 tablet (20 mg total) by mouth daily. 90 tablet 3   fluticasone (FLONASE) 50 MCG/ACT nasal spray Place 2 sprays into both nostrils daily. 16 g 6   Glucose Blood (BLOOD GLUCOSE TEST STRIPS) STRP 1 each by In Vitro route in the morning, at noon, and at bedtime. May substitute to any manufacturer covered by patient's  insurance. 100 each 11   lamoTRIgine (LAMICTAL) 150 MG tablet Take 2 tablets (300 mg total) by mouth daily. 180 tablet 3   latanoprost (XALATAN) 0.005 % ophthalmic solution Place 1 drop into both eyes nightly. 2.5 mL 2   levothyroxine (SYNTHROID) 137 MCG tablet Take 1 tablet (137 mcg total) by mouth daily before breakfast. 90 tablet 3   nitroGLYCERIN (NITROSTAT) 0.4 MG SL tablet Place 1 tablet (0.4 mg total) under the tongue every 5 (five) minutes as needed for chest pain. 25 tablet 0   telmisartan (MICARDIS) 80 MG tablet Take 1 tablet (80 mg total) by mouth daily. 90 tablet 1   No current facility-administered medications for this visit.     Allergies as of 09/13/2023 - Review Complete 09/13/2023  Allergen Reaction Noted   Mounjaro [tirzepatide] Diarrhea 08/10/2023   Penicillins Nausea And Vomiting 01/10/2011   Lisinopril Cough 03/17/2016   Ozempic (0.25 or 0.5 mg-dose) [semaglutide(0.25 or 0.5mg -dos)] Nausea Only and Other (See Comments) 09/16/2022    Family History  Problem Relation Age of Onset   Breast cancer Mother    Melanoma Father    Diabetes Sister    Arrhythmia Brother    Congestive Heart Failure Brother    Heart attack Brother    Atrial fibrillation Brother    Colon cancer Neg Hx    Stomach cancer Neg Hx    Esophageal cancer Neg Hx     Social History   Socioeconomic History   Marital status: Married    Spouse name: Not on file   Number of children: 1   Years of education: Not on file   Highest education level: Not on file  Occupational History   Occupation: Production designer, theatre/television/film   Occupation: Transport planner  Tobacco Use   Smoking status: Never   Smokeless tobacco: Never  Vaping Use   Vaping status: Never Used  Substance and Sexual Activity   Alcohol use: Not Currently   Drug use: No   Sexual activity: Yes    Partners: Female  Other Topics Concern   Not on file  Social History Narrative   Not on file   Social Drivers of Health   Financial Resource Strain: Low Risk  (02/03/2023)   Overall Financial Resource Strain (CARDIA)    Difficulty of Paying Living Expenses: Not hard at all  Food Insecurity: No Food Insecurity (02/03/2023)   Hunger Vital Sign    Worried About Running Out of Food in the Last Year: Never true    Ran Out of Food in the Last Year: Never true  Transportation Needs: No Transportation Needs (02/03/2023)   PRAPARE - Administrator, Civil Service (Medical): No    Lack of Transportation (Non-Medical): No  Physical Activity: Insufficiently Active (02/03/2023)   Exercise Vital Sign    Days of Exercise per Week: 2 days    Minutes of Exercise per Session: 40 min  Stress: No  Stress Concern Present (02/03/2023)   [employer] of Occupational Health - Occupational Stress Questionnaire    Feeling of Stress : Not at all  Social Connections: Moderately Isolated (02/03/2023)   Social Connection and Isolation Panel [NHANES]    Frequency of Communication with Friends and Family: More than three times a week    Frequency of Social Gatherings with Friends and Family: More than three times a week    Attends Religious Services: Never    Database administrator or Organizations: No    Attends Banker Meetings: Never  Marital Status: Married  Catering manager Violence: Not At Risk (02/03/2023)   Humiliation, Afraid, Rape, and Kick questionnaire    Fear of Current or Ex-Partner: No    Emotionally Abused: No    Physically Abused: No    Sexually Abused: No    Review of Systems:    Constitutional: No weight loss, fever, chills, weakness or fatigue HEENT: Eyes: No change in vision               Ears, Nose, Throat:  No change in hearing or congestion Skin: No rash or itching Cardiovascular: No chest pain, chest pressure or palpitations   Respiratory: No SOB or cough Gastrointestinal: See HPI and otherwise negative Genitourinary: No dysuria or change in urinary frequency Neurological: No headache, dizziness or syncope Musculoskeletal: No new muscle or joint pain Hematologic: No bleeding or bruising Psychiatric: No history of depression or anxiety    Physical Exam:  Vital signs: BP 130/80   Pulse 61   Ht 5\' 9"  (1.753 m)   Wt 273 lb (123.8 kg)   BMI 40.32 kg/m   Constitutional: NAD, alert and cooperative Head:  Normocephalic and atraumatic. Eyes:   PEERL, EOMI. No icterus. Conjunctiva pink. Respiratory: Respirations even and unlabored. Lungs clear to auscultation bilaterally.   No wheezes, crackles, or rhonchi.  Cardiovascular:  Regular rate and rhythm. No peripheral edema, cyanosis or pallor.  Gastrointestinal:  Soft, nondistended, nontender. No  rebound or guarding. Normal bowel sounds. No appreciable masses or hepatomegaly. Rectal:  Declines Msk:  Symmetrical without gross deformities. Without edema, no deformity or joint abnormality.  Neurologic:  Alert and  oriented x4;  grossly normal neurologically.  Skin:   Dry and intact without significant lesions or rashes. Psychiatric: Oriented to person, place and time. Demonstrates good judgement and reason without abnormal affect or behaviors.  RELEVANT LABS AND IMAGING: CBC    Component Value Date/Time   WBC 5.6 08/23/2023 1031   WBC 7.6 02/11/2022 0958   RBC 4.42 08/23/2023 1031   RBC 4.60 02/11/2022 0958   HGB 13.6 08/23/2023 1031   HCT 40.3 08/23/2023 1031   PLT 172 08/23/2023 1031   MCV 91 08/23/2023 1031   MCH 30.8 08/23/2023 1031   MCH 31.7 02/11/2022 0958   MCHC 33.7 08/23/2023 1031   MCHC 34.7 02/11/2022 0958   RDW 12.9 08/23/2023 1031   LYMPHSABS 2.3 08/23/2023 1031   EOSABS 0.2 08/23/2023 1031   BASOSABS 0.0 08/23/2023 1031    CMP     Component Value Date/Time   NA 139 08/23/2023 1031   K 4.2 08/23/2023 1031   CL 101 08/23/2023 1031   CO2 24 08/23/2023 1031   GLUCOSE 120 (H) 08/23/2023 1031   GLUCOSE 108 (H) 02/11/2022 0958   BUN 9 08/23/2023 1031   CREATININE 0.73 (L) 08/23/2023 1031   CALCIUM 8.8 08/23/2023 1031   PROT 5.9 (L) 08/23/2023 1031   ALBUMIN 4.3 08/23/2023 1031   AST 45 (H) 08/23/2023 1031   ALT 39 08/23/2023 1031   ALKPHOS 95 08/23/2023 1031   BILITOT 1.1 08/23/2023 1031   GFRNONAA >60 02/11/2022 0958   GFRAA 110 02/11/2020 0927     Assessment/Plan:   RUQ pain Intermittent diarrhea 3 bouts of intermittent diarrhea since February coinciding with starting on Mounjaro but having 1 bowel after cessation of Mounjaro with intermittent RUQ pain not associated with eating, movement, bowel movements.  History of cholecystectomy years ago.  No alcohol use but noted to have AST 45/ALT 39 and CT  showing hepatic steatosis.  Suspect his symptoms  could be side effect of his Mounjaro still lingering even after stopping the medication.  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  Benign CT scan is reassuring.  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  CTAP showing hepatic steatosis. 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  Must exclude other chronic causes of hepatocellular inflammation that can mimic fatty liver on ultrasound -- Labs to include: iron, ferritin, TIBC,  IgG, ANA, Antismooth muscle antibody, AMA, celiac, thyroid, alpha-one-antitrypsin level, CBC, CMP, PT/INR -- consider elastography based on FIB 4 score (to be calculated) -- need LFTs and CBC monitored every 6 months, revaluation every 2-3 years.  -- Continue to work on risk factor modification including diet exercise and control of risk factors including blood sugars. - Educated patient on hepatic steatosis and provided patient education handouts -- consider elastography based on FIB 4 score.  Colon cancer screening Reported colonoscopy in 2022 with repeat 2032, unsure where he had this done.  No family history of colon cancer  Assigned to Dr. Elvin Hammer today  Suzanna Erp, PA-C [facility] 09/13/2023, 2:16 PM  Cc: Odilia Bennett, PA

## 2023-09-13 NOTE — Patient Instructions (Addendum)
 A high fiber diet with plenty of fluids (up to 8 glasses of water daily) is suggested to relieve these symptoms.  benefiber, 1 tablespoon once or twice daily can be used to keep bowels regular if needed.   Your provider has requested that you go to the basement level for lab work before leaving today. Press "B" on the elevator. The lab is located at the first door on the left as you exit the elevator. Please call if symptoms worsen or increase  Due to recent changes in healthcare laws, you may see the results of your imaging and laboratory studies on MyChart before your provider has had a chance to review them.  We understand that in some cases there may be results that are confusing or concerning to you. Not all laboratory results come back in the same time frame and the provider may be waiting for multiple results in order to interpret others.  Please give us  48 hours in order for your provider to thoroughly review all the results before contacting the office for clarification of your results.   _______________________________________________________  If your blood pressure at your visit was 140/90 or greater, please contact your primary care physician to follow up on this.  _______________________________________________________  If you are age 23 or older, your body mass index should be between 23-30. Your Body mass index is 40.32 kg/m. If this is out of the aforementioned range listed, please consider follow up with your Primary Care Provider.  If you are age 10 or younger, your body mass index should be between 19-25. Your Body mass index is 40.32 kg/m. If this is out of the aformentioned range listed, please consider follow up with your Primary Care Provider.   ________________________________________________________  The [facility] providers would like to encourage you to use MYCHART to communicate with providers for non-urgent requests or questions.  Due to long hold times on the  telephone, sending your provider a message by [facility] may be a faster and more efficient way to get a response.  Please allow 48 business hours for a response.  Please remember that this is for non-urgent requests.  _______________________________________________________ Thank you for trusting me with your gastrointestinal care!   Suzanna Erp, PA

## 2023-09-19 ENCOUNTER — Other Ambulatory Visit: Payer: Self-pay

## 2023-09-19 DIAGNOSIS — R7989 Other specified abnormal findings of blood chemistry: Secondary | ICD-10-CM

## 2023-09-19 DIAGNOSIS — K760 Fatty (change of) liver, not elsewhere classified: Secondary | ICD-10-CM

## 2023-09-19 LAB — CERULOPLASMIN: Ceruloplasmin: 22 mg/dL (ref 14–30)

## 2023-09-19 LAB — ANTI-SMOOTH MUSCLE ANTIBODY, IGG: Actin (Smooth Muscle) Antibody (IGG): <20 U (ref <20)

## 2023-09-19 LAB — ALPHA-1-ANTITRYPSIN: A-1 Antitrypsin, Ser: 145 mg/dL (ref 83–199)

## 2023-09-19 LAB — MITOCHONDRIAL ANTIBODIES: Mitochondrial M2 Ab, IgG: <=20 U (ref <=20.0)

## 2023-09-19 LAB — ANA: Anti Nuclear Antibody (ANA): NEGATIVE

## 2023-09-26 ENCOUNTER — Ambulatory Visit (HOSPITAL_BASED_OUTPATIENT_CLINIC_OR_DEPARTMENT_OTHER)
Admission: RE | Admit: 2023-09-26 | Discharge: 2023-09-26 | Disposition: A | Discharge status: Home / Self Care | Source: Ambulatory Visit | Attending: Gastroenterology | Admitting: Radiology

## 2023-09-26 DIAGNOSIS — R7989 Other specified abnormal findings of blood chemistry: Secondary | ICD-10-CM

## 2023-09-26 DIAGNOSIS — K760 Fatty (change of) liver, not elsewhere classified: Secondary | ICD-10-CM | POA: Diagnosis not present

## 2023-10-17 ENCOUNTER — Ambulatory Visit

## 2023-10-17 VITALS — BP 138/78 | HR 64 | Temp 98.0°F | Ht 69.0 in | Wt 270.6 lb

## 2023-10-17 DIAGNOSIS — H4089 Other specified glaucoma: Secondary | ICD-10-CM | POA: Diagnosis not present

## 2023-10-17 DIAGNOSIS — J069 Acute upper respiratory infection, unspecified: Secondary | ICD-10-CM | POA: Insufficient documentation

## 2023-10-17 MED ORDER — LATANOPROST 0.005 % OP SOLN: OPHTHALMIC | 2 refills | Status: DC

## 2023-10-17 NOTE — Patient Instructions (Signed)
  VISIT SUMMARY: Today, you were seen for a cough and drainage that has been bothering you for the past few days, especially at night. We also reviewed your diabetes management.  YOUR PLAN: COUGH WITH DRAINAGE: You have been experiencing a cough and drainage for the past 3-4 days, which worsens at night. This is likely due to sinus drainage rather than an infection. -Continue using Flonase  as maintenance therapy. -Consider taking Claritin or Zyrtec 10 mg once daily for drainage. -Consider using DayQuil or NyQuil for additional symptom relief. -Increase your fluid intake. -Contact our office if your symptoms persist or worsen by Friday for a possible steroid prescription.  TYPE 2 DIABETES MELLITUS WITHOUT COMPLICATIONS: Your diabetes is currently managed with diet. Your last A1c was 7.4 in December 2024, indicating that your blood sugar control needs improvement. -Continue to manage your diabetes with diet.                      Contains text generated by Abridge.                                 Contains text generated by Abridge.

## 2023-10-17 NOTE — Assessment & Plan Note (Signed)
 Cough with drainage Cough with drainage for 3-4 days, more pronounced at night due to post-nasal drainage. No fever, chest pain, or difficulty breathing. Lungs are clear, suggesting no pneumonia. Likely due to sinus drainage rather than bacterial infection. Nasal examination shows inflammation but no significant infection. Eardrum examination shows no infection, though fluid may be present behind the left eardrum. Flonase  is used as maintenance therapy. Steroids are considered a last resort due to potential side effects such as increased blood sugar and weight gain. - Continue Flonase  as maintenance therapy. - Consider Claritin or Zyrtec 10 mg once daily for drainage. - Consider DayQuil or NyQuil for additional symptom relief. - Increase fluid intake. - Contact office for possible steroid prescription if symptoms persist or worsen by Friday.

## 2023-10-17 NOTE — Progress Notes (Signed)
 Acute Office Visit  Subjective:    Patient ID: Gerald Jordan, male    DOB: Nov 16, 1960, 63 y.o.   MRN: [id number]  No chief complaint on file.   HPI: Discussed the use of AI scribe software for clinical note transcription with the patient, who gave verbal consent to proceed.  History of Present Illness   Gerald Jordan is a 63 year old male who presents with a cough and drainage.  He has been experiencing a cough and drainage for the past three to four days. The drainage is green or yellow in the mornings, and he is not congested. He has a history of sinus surgery, which has left his sinuses open, preventing congestion but resulting in drainage. He has not tried any over-the-counter medications for this issue, but he uses Flonase  daily as maintenance.  He has difficulty sleeping due to the cough, which worsens at night when he lies down. No fever, ear pain, or chest pain, but deep breathing triggers coughing. He has not experienced any trouble breathing.       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   Type 2 diabetes mellitus (HCC)    Unspecified hearing loss, bilateral    Ventricular fibrillation [facility])     Past Surgical History:  Procedure Laterality Date   ANTERIOR CERVICAL DECOMP/DISCECTOMY FUSION N/A 02/17/2022   Procedure: ANTERIOR CERVICAL  DECOMPRESSION/DISCECTOMY FUSION  CERVICAL FIVE-SEVEN;  Surgeon: Mort Ards, MD;  Location: [facility] OR;  Service: Orthopedics;  Laterality: N/A;   arm surgery  05/2014   CARDIAC CATHETERIZATION  2019   one stent placed   CHOLECYSTECTOMY  02/2016   COLONOSCOPY  2022   HERNIA REPAIR     1999-umbilical   NASAL SINUS SURGERY  2000   tendon repair 11/2014      Family History  Problem Relation Age of Onset   Breast cancer Mother    Melanoma Father    Diabetes Sister    Arrhythmia Brother    Congestive Heart Failure Brother    Heart attack Brother    Atrial fibrillation Brother    Colon cancer Neg Hx    Stomach cancer Neg Hx    Esophageal cancer Neg Hx     Social History   Socioeconomic History   Marital status: Married    Spouse name: Not on file   Number of children: 1   Years of education: Not on file   Highest education level: Not on file  Occupational History   Occupation: Production designer, theatre/television/film   Occupation: Transport planner  Tobacco Use   Smoking status: Never   Smokeless tobacco: Never  Vaping Use   Vaping status: Never Used  Substance and Sexual Activity   Alcohol use: Not Currently   Drug use: No  Sexual activity: Yes    Partners: Female  Other Topics Concern   Not on file  Social History Narrative   Not on file   Social Drivers of Health   Financial Resource Strain: Low Risk  (02/03/2023)   Overall Financial Resource Strain (CARDIA)    Difficulty of Paying Living Expenses: Not hard at all  Food Insecurity: No Food Insecurity (02/03/2023)   Hunger Vital Sign    Worried About Running Out of Food in the Last Year: Never true    Ran Out of Food in the Last Year: Never true  Transportation Needs: No Transportation Needs (02/03/2023)   PRAPARE - Administrator, Civil Service (Medical): No    Lack of Transportation (Non-Medical): No  Physical Activity: Insufficiently Active (02/03/2023)   Exercise Vital Sign    Days of Exercise per Week: 2 days    Minutes of Exercise  per Session: 40 min  Stress: No Stress Concern Present (02/03/2023)   [employer] of Occupational Health - Occupational Stress Questionnaire    Feeling of Stress : Not at all  Social Connections: Moderately Isolated (02/03/2023)   Social Connection and Isolation Panel [NHANES]    Frequency of Communication with Friends and Family: More than three times a week    Frequency of Social Gatherings with Friends and Family: More than three times a week    Attends Religious Services: Never    Database administrator or Organizations: No    Attends Banker Meetings: Never    Marital Status: Married  Catering manager Violence: Not At Risk (02/03/2023)   Humiliation, Afraid, Rape, and Kick questionnaire    Fear of Current or Ex-Partner: No    Emotionally Abused: No    Physically Abused: No    Sexually Abused: No    Outpatient Medications Prior to Visit  Medication Sig Dispense Refill   aspirin 81 MG tablet Take 81 mg by mouth daily.     atorvastatin  (LIPITOR ) 80 MG tablet Take 1 tablet (80 mg total) by mouth daily. 90 tablet 3   Blood Glucose Monitoring Suppl DEVI 1 each by Does not apply route in the morning, at noon, and at bedtime. May substitute to any manufacturer covered by patient's insurance. 1 each 0   carvedilol  (COREG ) 6.25 MG tablet Take 1 tablet (6.25 mg total) by mouth 2 (two) times daily. 180 tablet 3   citalopram  (CELEXA ) 20 MG tablet Take 1 tablet (20 mg total) by mouth daily. 90 tablet 3   fluticasone  (FLONASE ) 50 MCG/ACT nasal spray Place 2 sprays into both nostrils daily. 16 g 6   Glucose Blood (BLOOD GLUCOSE TEST STRIPS) STRP 1 each by In Vitro route in the morning, at noon, and at bedtime. May substitute to any manufacturer covered by patient's insurance. 100 each 11   lamoTRIgine  (LAMICTAL ) 150 MG tablet Take 2 tablets (300 mg total) by mouth daily. 180 tablet 3   levothyroxine  (SYNTHROID ) 137 MCG tablet Take 1 tablet (137 mcg total) by mouth daily before  breakfast. 90 tablet 3   nitroGLYCERIN  (NITROSTAT ) 0.4 MG SL tablet Place 1 tablet (0.4 mg total) under the tongue every 5 (five) minutes as needed for chest pain. 25 tablet 0   telmisartan  (MICARDIS ) 80 MG tablet Take 1 tablet (80 mg total) by mouth daily. 90 tablet 1   latanoprost  (XALATAN ) 0.005 % ophthalmic solution Place 1 drop into both eyes nightly. 2.5 mL 2   No facility-administered medications prior to visit.  Allergies  Allergen Reactions   Mounjaro [Tirzepatide ] Diarrhea   Penicillins Nausea And Vomiting    Mouth Ulcers - tolerated Augmentin  09/2012     Lisinopril Cough   Ozempic  (0.25 Or 0.5 Mg-Dose) [Semaglutide (0.25 Or 0.5mg -Dos)] Nausea Only and Other (See Comments)    Severe nausea, upper abdominal discomfort    Review of Systems  Constitutional:  Negative for chills, fatigue and fever.  HENT:  Positive for postnasal drip. Negative for congestion, ear pain and sinus pain.   Respiratory:  Positive for cough. Negative for shortness of breath.   Cardiovascular:  Negative for chest pain.  Gastrointestinal:  Negative for abdominal pain, constipation, diarrhea, nausea and vomiting.  Musculoskeletal:  Negative for myalgias.  Neurological:  Negative for headaches.  Psychiatric/Behavioral: Negative.         Objective:         10/17/2023   10:26 AM 09/13/2023    1:32 PM 08/23/2023    9:31 AM  Vitals with BMI  Height 5\' 9"  5\' 9"  5\' 9"   Weight 270 lbs 10 oz 273 lbs 269 lbs  BMI 39.94 40.3 39.71  Systolic 138 130 478  Diastolic 78 80 62  Pulse 64 61 55    No data found.   Physical Exam Vitals and nursing note reviewed.  Constitutional:      Appearance: He is obese.  HENT:     Head: Normocephalic and atraumatic.     Right Ear: Tympanic membrane normal.     Ears:     Comments: Mild cerumen in right ear canal, normal TM on right  Mild erythema of the left TM with mild effusion, but NORMAL LIGHT REFLEX NOTED No cerumen    Nose: Rhinorrhea present.  Eyes:      Pupils: Pupils are equal, round, and reactive to light.  Cardiovascular:     Rate and Rhythm: Normal rate and regular rhythm.  Pulmonary:     Effort: Pulmonary effort is normal.     Breath sounds: Normal breath sounds.  Musculoskeletal:        General: Normal range of motion.     Cervical back: Normal range of motion.  Lymphadenopathy:     Cervical: No cervical adenopathy.  Neurological:     General: No focal deficit present.     Mental Status: He is alert.  Psychiatric:        Mood and Affect: Mood normal.     Health Maintenance Due  Topic Date Due   OPHTHALMOLOGY EXAM  03/10/2022    There are no preventive care reminders to display for this patient.   Lab Results  Component Value Date   TSH 4.59 09/13/2023   Lab Results  Component Value Date   WBC 5.9 09/13/2023   HGB 14.8 09/13/2023   HCT 43.4 09/13/2023   MCV 91.4 09/13/2023   PLT 170.0 09/13/2023   Lab Results  Component Value Date   NA 139 09/13/2023   K 3.9 09/13/2023   CO2 29 09/13/2023   GLUCOSE 157 (H) 09/13/2023   BUN 7 09/13/2023   CREATININE 0.79 09/13/2023   BILITOT 0.9 09/13/2023   ALKPHOS 112 09/13/2023   AST 28 09/13/2023   ALT 39 09/13/2023   PROT 6.6 09/13/2023   ALBUMIN 4.5 09/13/2023   CALCIUM  9.1 09/13/2023   ANIONGAP 7 02/11/2022   EGFR 103 08/23/2023   GFR 94.87 09/13/2023   Lab Results  Component Value Date   CHOL 112 08/10/2023   Lab Results  Component Value Date  HDL 40 08/10/2023   Lab Results  Component Value Date   LDLCALC 56 08/10/2023   Lab Results  Component Value Date   TRIG 81 08/10/2023   Lab Results  Component Value Date   CHOLHDL 2.8 08/10/2023   Lab Results  Component Value Date   HGBA1C 6.1 (H) 08/10/2023       Assessment & Plan:  Viral URI with cough Assessment & Plan: Cough with drainage Cough with drainage for 3-4 days, more pronounced at night due to post-nasal drainage. No fever, chest pain, or difficulty breathing. Lungs are  clear, suggesting no pneumonia. Likely due to sinus drainage rather than bacterial infection. Nasal examination shows inflammation but no significant infection. Eardrum examination shows no infection, though fluid may be present behind the left eardrum. Flonase  is used as maintenance therapy. Steroids are considered a last resort due to potential side effects such as increased blood sugar and weight gain. - Continue Flonase  as maintenance therapy. - Consider Claritin or Zyrtec 10 mg once daily for drainage. - Consider DayQuil or NyQuil for additional symptom relief. - Increase fluid intake. - Contact office for possible steroid prescription if symptoms persist or worsen by Friday.   Other glaucoma of both eyes -     Latanoprost ; Place 1 drop into both eyes nightly.  Dispense: 2.5 mL; Refill: 2     Meds ordered this encounter  Medications   latanoprost  (XALATAN ) 0.005 % ophthalmic solution    Sig: Place 1 drop into both eyes nightly.    Dispense:  2.5 mL    Refill:  2    No orders of the defined types were placed in this encounter.   Assessment and Plan           Follow-up: Return if symptoms worsen or fail to improve.  An After Visit Summary was printed and given to the patient.  Judas Mohammad, MD [facility] [phone]

## 2023-10-20 ENCOUNTER — Telehealth: Payer: Self-pay

## 2023-10-20 NOTE — Telephone Encounter (Signed)
 Per office note, "Contact office for possible steroid prescription if symptoms persist or worsen by Friday."   Copied from CRM [id number]. Topic: Clinical - Medical Advice >> Oct 20, 2023  8:04 AM Gerald Jordan wrote: Reason for CRM: Patient is still coughing and would like a prescription sent to [facility] as discussed in his appointment on 10/17/2023 with Dr Sirovol. Callback number is [phone]

## 2023-10-23 ENCOUNTER — Other Ambulatory Visit: Payer: Self-pay

## 2023-10-23 MED ORDER — PREDNISONE 20 MG PO TABS
40.0000 mg | ORAL_TABLET | Freq: Every day | ORAL | 0 refills | Status: AC
Start: 2023-10-23 — End: 2023-10-28

## 2023-10-24 ENCOUNTER — Ambulatory Visit: Payer: Self-pay | Admitting: Family Medicine

## 2023-10-24 LAB — HM DIABETES EYE EXAM

## 2023-11-08 ENCOUNTER — Ambulatory Visit: Admitting: Gastroenterology

## 2023-11-08 ENCOUNTER — Encounter: Payer: Self-pay | Admitting: Gastroenterology

## 2023-11-08 VITALS — BP 122/60 | HR 60 | Ht 67.5 in | Wt 270.5 lb

## 2023-11-08 DIAGNOSIS — R195 Other fecal abnormalities: Secondary | ICD-10-CM

## 2023-11-08 DIAGNOSIS — K760 Fatty (change of) liver, not elsewhere classified: Secondary | ICD-10-CM

## 2023-11-08 DIAGNOSIS — R1011 Right upper quadrant pain: Secondary | ICD-10-CM | POA: Diagnosis not present

## 2023-11-08 DIAGNOSIS — Z1211 Encounter for screening for malignant neoplasm of colon: Secondary | ICD-10-CM

## 2023-11-08 DIAGNOSIS — R197 Diarrhea, unspecified: Secondary | ICD-10-CM | POA: Diagnosis not present

## 2023-11-08 NOTE — Progress Notes (Signed)
 Noted

## 2023-11-08 NOTE — Progress Notes (Signed)
 Chief Complaint: Follow up Primary GI MD: Dr. Elvin Hammer  HPI: 63 year old male history of diabetes, fatty liver, OSA, MI (2019), presents for follow up   Discussed the use of AI scribe software for clinical note transcription with the patient, who gave verbal consent to proceed.  History of Present Illness He experiences intermittent right upper quadrant pain and diarrhea, which he associates with the initiation of Mounjaro. The pain was more frequent in the past but has decreased significantly, with the last episode occurring two to three weeks ago. The pain sometimes occurs while in bed and is not associated with eating or bowel movements. He has been taking Benefiber, which he believes may be contributing to the reduction in pain and diarrhea. No current diarrhea is reported, and there is a significant reduction in right upper quadrant pain.  He has a history of elevated liver enzymes, which were rechecked and found to be normal. A comprehensive workup, including elastography, was performed and the score was 6.1, which is within the normal range. He has a known diagnosis of fatty liver, identified via CT scan.  He underwent a colonoscopy in 2022, which was unremarkable, and he is not due for another until 2032. He cannot recall the exact location or practice where the procedure was performed, but he believes it was either in [place] or [place].  He lives in [place] and works in [place].    PREVIOUS GI WORKUP   He reports a colonoscopy in 2022 but unsure where he got it done. He was told to come back in 10 years.   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   Type 2 diabetes mellitus (HCC)    Unspecified hearing loss, bilateral    Ventricular fibrillation [facility])     Past Surgical History:  Procedure Laterality Date   ANTERIOR CERVICAL DECOMP/DISCECTOMY FUSION N/A 02/17/2022   Procedure: ANTERIOR CERVICAL DECOMPRESSION/DISCECTOMY FUSION  CERVICAL FIVE-SEVEN;  Surgeon: Mort Ards, MD;  Location: [facility] OR;  Service: Orthopedics;  Laterality: N/A;   arm surgery  05/2014   CARDIAC CATHETERIZATION  2019   one stent placed   CHOLECYSTECTOMY  02/2016   COLONOSCOPY  2022   HERNIA REPAIR     1999-umbilical   NASAL SINUS SURGERY  2000   tendon repair 11/2014      Current Outpatient Medications  Medication Sig Dispense Refill   aspirin 81 MG tablet Take 81 mg by mouth daily.     atorvastatin  (LIPITOR ) 80 MG tablet Take 1 tablet (80 mg total) by mouth daily. 90 tablet 3   Blood Glucose Monitoring Suppl DEVI 1 each by Does not apply route in the morning, at noon, and at bedtime. May substitute to any manufacturer covered by patient's insurance. 1 each 0   carvedilol  (  COREG ) 6.25 MG tablet Take 1 tablet (6.25 mg total) by mouth 2 (two) times daily. 180 tablet 3   citalopram  (CELEXA ) 20 MG tablet Take 1 tablet (20 mg total) by mouth daily. 90 tablet 3   fluticasone  (FLONASE ) 50 MCG/ACT nasal spray Place 2 sprays into both nostrils daily. 16 g 6   Glucose Blood (BLOOD GLUCOSE TEST STRIPS) STRP 1 each by In Vitro route in the morning, at noon, and at bedtime. May substitute to any manufacturer covered by patient's insurance. 100 each 11   lamoTRIgine  (LAMICTAL ) 150 MG tablet Take 2 tablets (300 mg total) by mouth daily. 180 tablet 3   latanoprost   (XALATAN ) 0.005 % ophthalmic solution Place 1 drop into both eyes nightly. 2.5 mL 2   levothyroxine  (SYNTHROID ) 137 MCG tablet Take 1 tablet (137 mcg total) by mouth daily before breakfast. 90 tablet 3   nitroGLYCERIN  (NITROSTAT ) 0.4 MG SL tablet Place 1 tablet (0.4 mg total) under the tongue every 5 (five) minutes as needed for chest pain. 25 tablet 0   telmisartan  (MICARDIS ) 80 MG tablet Take 1 tablet (80 mg total) by mouth daily. 90 tablet 1   No current facility-administered medications for this visit.    Allergies as of 11/08/2023 - Review Complete 11/08/2023  Allergen Reaction Noted   Mounjaro [tirzepatide ] Diarrhea 08/10/2023   Penicillins Nausea And Vomiting 01/10/2011   Lisinopril Cough 03/17/2016   Ozempic  (0.25 or 0.5 mg-dose) [semaglutide (0.25 or 0.5mg -dos)] Nausea Only and Other (See Comments) 09/16/2022    Family History  Problem Relation Age of Onset   Breast cancer Mother    Melanoma Father    Diabetes Sister    Arrhythmia Brother    Congestive Heart Failure Brother    Heart attack Brother    Atrial fibrillation Brother    Colon cancer Neg Hx    Stomach cancer Neg Hx    Esophageal cancer Neg Hx     Social History   Socioeconomic History   Marital status: Married    Spouse name: Not on file   Number of children: 1   Years of education: Not on file   Highest education level: Not on file  Occupational History   Occupation: Production designer, theatre/television/film   Occupation: Transport planner  Tobacco Use   Smoking status: Never   Smokeless tobacco: Never  Vaping Use   Vaping status: Never Used  Substance and Sexual Activity   Alcohol use: Not Currently   Drug use: No   Sexual activity: Yes    Partners: Female  Other Topics Concern   Not on file  Social History Narrative   Not on file   Social Drivers of Health   Financial Resource Strain: Low Risk  (02/03/2023)   Overall Financial Resource Strain (CARDIA)    Difficulty of Paying Living Expenses: Not hard at all  Food Insecurity:  Low Risk  (10/27/2023)   Received from [facility]   Hunger Vital Sign    Worried About Running Out of Food in the Last Year: Never true    Within the past 12 months, the food you bought just didn't last and you didn't have money to get more: Not on file  Transportation Needs: No Transportation Needs (02/03/2023)   PRAPARE - Administrator, Civil Service (Medical): No    Lack of Transportation (Non-Medical): No  Physical Activity: Insufficiently Active (02/03/2023)   Exercise Vital Sign    Days of Exercise per Week: 2 days    Minutes of Exercise  per Session: 40 min  Stress: No Stress Concern Present (02/03/2023)   [employer] of Occupational Health - Occupational Stress Questionnaire    Feeling of Stress : Not at all  Social Connections: Moderately Isolated (02/03/2023)   Social Connection and Isolation Panel [NHANES]    Frequency of Communication with Friends and Family: More than three times a week    Frequency of Social Gatherings with Friends and Family: More than three times a week    Attends Religious Services: Never    Database administrator or Organizations: No    Attends Banker Meetings: Never    Marital Status: Married  Catering manager Violence: Not At Risk (02/03/2023)   Humiliation, Afraid, Rape, and Kick questionnaire    Fear of Current or Ex-Partner: No    Emotionally Abused: No    Physically Abused: No    Sexually Abused: No    Review of Systems:    Constitutional: No weight loss, fever, chills, weakness or fatigue HEENT: Eyes: No change in vision               Ears, Nose, Throat:  No change in hearing or congestion Skin: No rash or itching Cardiovascular: No chest pain, chest pressure or palpitations   Respiratory: No SOB or cough Gastrointestinal: See HPI and otherwise negative Genitourinary: No dysuria or change in urinary frequency Neurological: No headache, dizziness or syncope Musculoskeletal: No new muscle or joint  pain Hematologic: No bleeding or bruising Psychiatric: No history of depression or anxiety    Physical Exam:  Vital signs: BP 122/60 (BP Location: Left Arm, Patient Position: Sitting, Cuff Size: Large)   Pulse 60   Ht 5' 7.5 (1.715 m) Comment: height measured without shoes  Wt 270 lb 8 oz (122.7 kg)   BMI 41.74 kg/m   Constitutional: NAD, alert and cooperative Head:  Normocephalic and atraumatic. Eyes:   PEERL, EOMI. No icterus. Conjunctiva pink. Respiratory: Respirations even and unlabored. Lungs clear to auscultation bilaterally.   No wheezes, crackles, or rhonchi.  Cardiovascular:  Regular rate and rhythm. No peripheral edema, cyanosis or pallor.  Gastrointestinal:  Soft, nondistended, nontender. No rebound or guarding. Normal bowel sounds. No appreciable masses or hepatomegaly. Rectal:  Declines Msk:  Symmetrical without gross deformities. Without edema, no deformity or joint abnormality.  Neurologic:  Alert and  oriented x4;  grossly normal neurologically.  Skin:   Dry and intact without significant lesions or rashes. Psychiatric: Oriented to person, place and time. Demonstrates good judgement and reason without abnormal affect or behaviors.   RELEVANT LABS AND IMAGING: CBC    Component Value Date/Time   WBC 5.9 09/13/2023 1409   RBC 4.74 09/13/2023 1409   HGB 14.8 09/13/2023 1409   HGB 13.6 08/23/2023 1031   HCT 43.4 09/13/2023 1409   HCT 40.3 08/23/2023 1031   PLT 170.0 09/13/2023 1409   PLT 172 08/23/2023 1031   MCV 91.4 09/13/2023 1409   MCV 91 08/23/2023 1031   MCH 30.8 08/23/2023 1031   MCH 31.7 02/11/2022 0958   MCHC 34.2 09/13/2023 1409   RDW 13.8 09/13/2023 1409   RDW 12.9 08/23/2023 1031   LYMPHSABS 2.3 09/13/2023 1409   LYMPHSABS 2.3 08/23/2023 1031   MONOABS 0.7 09/13/2023 1409   EOSABS 0.1 09/13/2023 1409   EOSABS 0.2 08/23/2023 1031   BASOSABS 0.0 09/13/2023 1409   BASOSABS 0.0 08/23/2023 1031    CMP     Component Value Date/Time   NA 139  09/13/2023 1409  NA 139 08/23/2023 1031   K 3.9 09/13/2023 1409   CL 103 09/13/2023 1409   CO2 29 09/13/2023 1409   GLUCOSE 157 (H) 09/13/2023 1409   BUN 7 09/13/2023 1409   BUN 9 08/23/2023 1031   CREATININE 0.79 09/13/2023 1409   CALCIUM  9.1 09/13/2023 1409   PROT 6.6 09/13/2023 1409   PROT 5.9 (L) 08/23/2023 1031   ALBUMIN 4.5 09/13/2023 1409   ALBUMIN 4.3 08/23/2023 1031   AST 28 09/13/2023 1409   ALT 39 09/13/2023 1409   ALKPHOS 112 09/13/2023 1409   BILITOT 0.9 09/13/2023 1409   BILITOT 1.1 08/23/2023 1031   GFRNONAA >60 02/11/2022 0958   GFRAA 110 02/11/2020 0927     Assessment/Plan:   RUQ pain Intermittent diarrhea 3 bouts of intermittent diarrhea since February coinciding with starting on Mounjaro but having 1 bowel after cessation of Mounjaro with intermittent RUQ pain not associated with eating, movement, bowel movements.  History of cholecystectomy years ago.  CT showing hepatic steatosis.  Suspect his symptoms could be side effect of his Mounjaro still lingering even after stopping the medication.  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  Benign CT scan is reassuring.  BMI 40. Recommended fiber supplement. Improvement in both diarrhea and pain since last visit - Continue fiber daily - Can follow-up as needed   Hepatic steatosis Recent normal LFTs, CBC normal, hepatitis panel negative.  CTAP showing hepatic steatosis. Elastography with kPa: 6.1. negative serologic workup. -- need LFTs and CBC monitored every 6 months, revaluation every 2-3 years.  -- Continue to work on risk factor modification including diet exercise and control of risk factors including blood sugars. - Educated patient on hepatic steatosis and provided patient education handouts   Colon cancer screening Reported colonoscopy in 2022 with repeat 2032, unsure where he had this done.  No family history of colon cancer. Reports possibly [place] but unable to tell us   where.   Gigi Kyle [facility] 11/08/2023, 9:45 AM  Cc: Mercy Stall, MD

## 2023-11-12 ENCOUNTER — Emergency Department (HOSPITAL_COMMUNITY)

## 2023-11-12 ENCOUNTER — Other Ambulatory Visit: Payer: Self-pay

## 2023-11-12 ENCOUNTER — Emergency Department (HOSPITAL_COMMUNITY)
Admission: EM | Admit: 2023-11-12 | Discharge: 2023-11-13 | Disposition: A | Discharge status: Home / Self Care | Attending: Emergency Medicine | Admitting: Emergency Medicine

## 2023-11-12 ENCOUNTER — Encounter (HOSPITAL_COMMUNITY): Payer: Self-pay

## 2023-11-12 DIAGNOSIS — E119 Type 2 diabetes mellitus without complications: Secondary | ICD-10-CM | POA: Insufficient documentation

## 2023-11-12 DIAGNOSIS — S52502A Unspecified fracture of the lower end of left radius, initial encounter for closed fracture: Secondary | ICD-10-CM | POA: Insufficient documentation

## 2023-11-12 DIAGNOSIS — M25522 Pain in left elbow: Secondary | ICD-10-CM | POA: Insufficient documentation

## 2023-11-12 DIAGNOSIS — I10 Essential (primary) hypertension: Secondary | ICD-10-CM | POA: Diagnosis not present

## 2023-11-12 DIAGNOSIS — Z7982 Long term (current) use of aspirin: Secondary | ICD-10-CM | POA: Diagnosis not present

## 2023-11-12 DIAGNOSIS — Z79899 Other long term (current) drug therapy: Secondary | ICD-10-CM | POA: Diagnosis not present

## 2023-11-12 DIAGNOSIS — W19XXXA Unspecified fall, initial encounter: Secondary | ICD-10-CM | POA: Insufficient documentation

## 2023-11-12 DIAGNOSIS — M25512 Pain in left shoulder: Secondary | ICD-10-CM | POA: Insufficient documentation

## 2023-11-12 DIAGNOSIS — S6992XA Unspecified injury of left wrist, hand and finger(s), initial encounter: Secondary | ICD-10-CM | POA: Diagnosis present

## 2023-11-12 NOTE — ED Triage Notes (Signed)
 Pt arrived via POV  s/p mechanical fall in which he caught himself with left arm at 1700 today. Noted deformity to left distal forearm. Pt c/o left shoulder 10/10. Left upper arm 10/10. Left forearm 10/10

## 2023-11-12 NOTE — ED Notes (Signed)
 Patient transported to X-ray

## 2023-11-13 MED ORDER — KETOROLAC TROMETHAMINE 15 MG/ML IJ SOLN
15.0000 mg | Freq: Once | INTRAMUSCULAR | Status: AC
  Administered 2023-11-13: 15 mg via INTRAMUSCULAR
  Filled 2023-11-13: qty 1

## 2023-11-13 MED ORDER — HYDROCODONE-ACETAMINOPHEN 5-325 MG PO TABS: 1.0000 | ORAL_TABLET | Freq: Four times a day (QID) | ORAL | 0 refills | Status: DC | PRN

## 2023-11-13 MED ORDER — ACETAMINOPHEN 500 MG PO TABS
1000.0000 mg | ORAL_TABLET | Freq: Once | ORAL | Status: AC
  Filled 2023-11-13: qty 2

## 2023-11-13 NOTE — Discharge Instructions (Signed)
 It was a pleasure taking part in your care.  As discussed, you have a radial fracture in your left wrist.  Please remain in splint until seen by orthopedics.  Please follow-up with Dr. Bartholome Boron or your orthopedic doctor.  Please take pain medication to include ibuprofen every 6 hours as needed.  You may take hydrocodone  pain medication as needed for pain.  Do not drive or operate heavy machinery while taking this medication.  Do not mix with alcohol.  Return to the ED with any new or worsening symptoms.

## 2023-11-13 NOTE — ED Provider Notes (Signed)
 [place] EMERGENCY DEPARTMENT AT [facility] Provider Note   CSN: [id number] Arrival date & time: 11/12/23  2220     Patient presents with: Arm Injury and Fall   ABDULRAHEEM Jordan is a 63 y.o. male with medical history of type 2 diabetes, nonalcoholic fatty liver disease, hypertension, hyperlipidemia, anxiety.  Patient presents to the ED for evaluation of fall.  States that this evening he was in his garage when he turned around.  He reports that his son-in-law had placed a weed Wacker in a place that did not typically go and he fell over this weed [employer].  He reports he fell onto his left side.  He denies hitting his head or losing consciousness, denies blood thinners.  Reports that he had pain immediately in his left wrist, left shoulder, left elbow.  He denies any history of surgery to his left elbow, wrist or elbow.  Denies medications prior to arrival.    Arm Injury Fall       Prior to Admission medications   Medication Sig Start Date End Date Taking? Authorizing Provider  HYDROcodone -acetaminophen  (NORCO/VICODIN) 5-325 MG tablet Take 1 tablet by mouth every 6 (six) hours as needed. 11/13/23  Yes Adel Aden, PA-C  aspirin 81 MG tablet Take 81 mg by mouth daily.    [provider]  atorvastatin  (LIPITOR ) 80 MG tablet Take 1 tablet (80 mg total) by mouth daily. 06/06/23   CoxBurleigh Carp, MD  Blood Glucose Monitoring Suppl DEVI 1 each by Does not apply route in the morning, at noon, and at bedtime. May substitute to any manufacturer covered by patient's insurance. 06/05/23   CoxBurleigh Carp, MD  carvedilol  (COREG ) 6.25 MG tablet Take 1 tablet (6.25 mg total) by mouth 2 (two) times daily. 06/06/23   CoxBurleigh Carp, MD  citalopram  (CELEXA ) 20 MG tablet Take 1 tablet (20 mg total) by mouth daily. 06/06/23   CoxBurleigh Carp, MD  fluticasone  (FLONASE ) 50 MCG/ACT nasal spray Place 2 sprays into both nostrils daily. 06/06/23   Cox, Burleigh Carp, MD  Glucose Blood (BLOOD GLUCOSE TEST  STRIPS) STRP 1 each by In Vitro route in the morning, at noon, and at bedtime. May substitute to any manufacturer covered by patient's insurance. 06/06/23 07/10/24  Mercy Stall, MD  lamoTRIgine  (LAMICTAL ) 150 MG tablet Take 2 tablets (300 mg total) by mouth daily. 06/21/23   CoxBurleigh Carp, MD  latanoprost  (XALATAN ) 0.005 % ophthalmic solution Place 1 drop into both eyes nightly. 10/17/23   Sirivol, Mamatha, MD  levothyroxine  (SYNTHROID ) 137 MCG tablet Take 1 tablet (137 mcg total) by mouth daily before breakfast. 06/06/23   Cox, Burleigh Carp, MD  nitroGLYCERIN  (NITROSTAT ) 0.4 MG SL tablet Place 1 tablet (0.4 mg total) under the tongue every 5 (five) minutes as needed for chest pain. 11/06/19   CoxBurleigh Carp, MD  telmisartan  (MICARDIS ) 80 MG tablet Take 1 tablet (80 mg total) by mouth daily. 06/06/23   CoxBurleigh Carp, MD    Allergies: Mounjaro  [tirzepatide ], Penicillins, Lisinopril, and Ozempic  (0.25 or 0.5 mg-dose) [semaglutide (0.25 or 0.5mg -dos)]    Review of Systems  Musculoskeletal:  Positive for arthralgias and myalgias.    Updated Vital Signs BP (!) 162/79   Pulse 68   Temp 98 F (36.7 C)   Resp 18   Ht 5' 9 (1.753 m)   Wt 117.9 kg   SpO2 97%   BMI 38.40 kg/m   Physical Exam Vitals and nursing note reviewed.  Constitutional:      General: He  is not in acute distress.    Appearance: He is well-developed.  HENT:     Head: Normocephalic and atraumatic.   Eyes:     Conjunctiva/sclera: Conjunctivae normal.    Cardiovascular:     Rate and Rhythm: Normal rate and regular rhythm.     Heart sounds: No murmur heard. Pulmonary:     Effort: Pulmonary effort is normal. No respiratory distress.     Breath sounds: Normal breath sounds.  Abdominal:     Palpations: Abdomen is soft.     Tenderness: There is no abdominal tenderness.   Musculoskeletal:        General: No swelling.     Cervical back: Neck supple.     Comments: Swelling, reduced range of motion to the left wrist.  No obvious  deformity.  2+ radial pulse.  Brisk cap refill.  Neurovascularly intact.   Skin:    General: Skin is warm and dry.     Capillary Refill: Capillary refill takes less than 2 seconds.   Neurological:     Mental Status: He is alert and oriented to person, place, and time. Mental status is at baseline.   Psychiatric:        Mood and Affect: Mood normal.     (all labs ordered are listed, but only abnormal results are displayed) Labs Reviewed - No data to display  EKG: None  Radiology: DG Shoulder Left Result Date: 11/12/2023 EXAM: 3 VIEW XRAY OF THE LEFT SHOULDER 11/12/2023 11:26:20 PM COMPARISON: None available. CLINICAL HISTORY: Fall. Mechanical fall in which he caught himself with left arm at 1700 today. Noted deformity to left distal forearm. Pt c/o left shoulder, left upper arm, left forearm. FINDINGS: BONES AND JOINTS: Mild glenohumeral degenerative changes. No acute fracture or dislocation. The [facility] joint is unremarkable in appearance. SOFT TISSUES: No abnormal calcifications. Visualized lung is unremarkable. Cervical spine fixation hardware. IMPRESSION: 1. No acute fracture or dislocation. 2. Mild glenohumeral degenerative changes. Electronically signed by: Zadie Herter MD 11/12/2023 11:37 PM EDT RP Workstation: [id number]   DG Humerus Left Result Date: 11/12/2023 EXAM: 2 VIEW(S) XRAY OF THE LEFT HUMERUS 11/12/2023 11:26:20 PM COMPARISON: None available. CLINICAL HISTORY: Fall. Mechanical fall in which he caught himself with left arm at 1700 today. Noted deformity to left distal forearm. Pt c/o left shoulder, left upper arm, and left forearm. FINDINGS: BONES AND JOINTS: No acute fracture. No focal osseous lesion. No joint dislocation. SOFT TISSUES: The soft tissues are unremarkable. IMPRESSION: 1. No significant abnormality. Electronically signed by: Zadie Herter MD 11/12/2023 11:36 PM EDT RP Workstation: [id number]   DG Elbow Complete Left Result Date: 11/12/2023 EXAM: 3 VIEW(S)  XRAY OF THE LEFT ELBOW COMPARISON: None available. CLINICAL HISTORY: Fall. Mechanical fall in which he caught himself with left arm at 1700 today. Noted deformity to left distal forearm. Pt c/o left shoulder, left upper arm, and left forearm. FINDINGS: BONES AND JOINTS: No acute fracture. No focal osseous lesion. No joint dislocation. SOFT TISSUES: The soft tissues are unremarkable. IMPRESSION: 1. No acute abnormality. Electronically signed by: Zadie Herter MD 11/12/2023 11:36 PM EDT RP Workstation: [id number]   DG Wrist Complete Left Result Date: 11/12/2023 EXAM: 4 VIEW(S) XRAY OF THE LEFT WRIST 11/12/2023 11:26:20 PM COMPARISON: None available. CLINICAL HISTORY: Fall. Mechanical fall in which he caught himself with left arm at 1700 today. Noted deformity to left distal forearm. Pt c/o left shoulder, left upper arm, and left forearm. FINDINGS: BONES AND JOINTS: Mildly comminuted, nondisplaced intraarticular distal radial  fracture. SOFT TISSUES: Mild dorsal soft tissue swelling overlying the wrist. IMPRESSION: 1. Mildly comminuted, nondisplaced intraarticular distal radial fracture. Electronically signed by: Zadie Herter MD 11/12/2023 11:36 PM EDT RP Workstation: [id number]     Procedures   Medications Ordered in the ED  ketorolac (TORADOL) 15 MG/ML injection 15 mg (15 mg Intramuscular Given 11/13/23 0110)  acetaminophen  (TYLENOL ) tablet 1,000 mg (1,000 mg Oral Given 11/13/23 0110)     Medical Decision Making Amount and/or Complexity of Data Reviewed Radiology: ordered.  Risk OTC drugs. Prescription drug management.   63 year old male presents for evaluation of fall.  Please see HPI for further details.  On examination the patient is afebrile and nontachycardic.  Lung sounds are clear bilaterally, not hypoxic.  Abdomen soft and compressible.  Neurological examination at baseline.  Left wrist with swelling, reduced range of motion secondary to swelling.  2+ radial pulse and brisk cap  refill.  Neurovascularly intact.  Compartments of arm soft and compressible.  Patient imaging ordered in triage includes plain film imaging of left shoulder, left humerus, left elbow and left wrist.  Patient imaging of left shoulder, left humerus and left elbow unremarkable.  Plain film imaging of left wrist shows mildly comminuted and nondisplaced intra-articular distal radial fracture.  Patient placed in ulnar gutter splint.  Provided with Toradol, Tylenol .  Patient will be discharged home with pain medication and advised to follow-up with orthopedics.  He had all of his questions answered to his satisfaction.  Stable to discharge.    Final diagnoses:  Fall, initial encounter  Closed fracture of distal end of left radius, unspecified fracture morphology, initial encounter    ED Discharge Orders          Ordered    HYDROcodone -acetaminophen  (NORCO/VICODIN) 5-325 MG tablet  Every 6 hours PRN        11/13/23 0135               Adel Aden, PA-C 11/13/23 0136    Ballard Bongo, MD 11/15/23 0001

## 2023-11-13 NOTE — Progress Notes (Unsigned)
 Subjective:  Patient ID: Gerald Jordan, male    DOB: Oct 23, 1960  Age: 63 y.o. MRN: [id number]  No chief complaint on file.   HPI:  Diabetes:  Complications: CAD Glucose checking: 130-135 mg/dl Hypoglycemia:no Most recent A1C: 7.4 Current medications: none. Last Eye Exam: 03/2023. Going two times a year Foot checks: daily  Hyperlipidemia: Current medications: lipitor  80 mg once daily  Hypertension: Current medications:telmisartan  80 mg once daily, coreg  6.25 mg one twice daily.  Diet: healthy.  Exercise: very active  Hypothyroidism: on synthroid  137 mcg once daily in am.   GAD: lamictal  150 mg twice daily, citalopram  20 mg daily.  Coronary artery disease: Patient is currently on Lipitor , carvedilol , and aspirin.     10/17/2023   10:27 AM 08/10/2023    9:04 AM 05/08/2023    8:13 AM 01/06/2023    7:30 AM 10/20/2022    8:50 AM  Depression screen PHQ 2/9  Decreased Interest 0 0 0 0 0  Down, Depressed, Hopeless 0 0 0 0 0  PHQ - 2 Score 0 0 0 0 0  Altered sleeping  0 0 0 0  Tired, decreased energy  1 1 0 1  Change in appetite  0 1 0 3  Feeling bad or failure about yourself   0 0 0 0  Trouble concentrating  0 0 0 0  Moving slowly or fidgety/restless  0 0 0 0  Suicidal thoughts  0 0 0 0  PHQ-9 Score  1 2 0 4  Difficult doing work/chores  Not difficult at all Not difficult at all Not difficult at all Not difficult at all        10/17/2023   10:27 AM  Fall Risk   Falls in the past year? 0  Number falls in past yr: 0  Injury with Fall? 0  Risk for fall due to : No Fall Risks    Patient Care Team: Mercy Stall, MD as PCP - General (Family Medicine) Ablott, Soyla Duverney (Optometry)   Review of Systems  Current Outpatient Medications on File Prior to Visit  Medication Sig Dispense Refill   aspirin 81 MG tablet Take 81 mg by mouth daily.     atorvastatin  (LIPITOR ) 80 MG tablet Take 1 tablet (80 mg total) by mouth daily. 90 tablet 3   Blood Glucose Monitoring Suppl  DEVI 1 each by Does not apply route in the morning, at noon, and at bedtime. May substitute to any manufacturer covered by patient's insurance. 1 each 0   carvedilol  (COREG ) 6.25 MG tablet Take 1 tablet (6.25 mg total) by mouth 2 (two) times daily. 180 tablet 3   citalopram  (CELEXA ) 20 MG tablet Take 1 tablet (20 mg total) by mouth daily. 90 tablet 3   fluticasone  (FLONASE ) 50 MCG/ACT nasal spray Place 2 sprays into both nostrils daily. 16 g 6   Glucose Blood (BLOOD GLUCOSE TEST STRIPS) STRP 1 each by In Vitro route in the morning, at noon, and at bedtime. May substitute to any manufacturer covered by patient's insurance. 100 each 11   HYDROcodone -acetaminophen  (NORCO/VICODIN) 5-325 MG tablet Take 1 tablet by mouth every 6 (six) hours as needed. 15 tablet 0   lamoTRIgine  (LAMICTAL ) 150 MG tablet Take 2 tablets (300 mg total) by mouth daily. 180 tablet 3   latanoprost  (XALATAN ) 0.005 % ophthalmic solution Place 1 drop into both eyes nightly. 2.5 mL 2   levothyroxine  (SYNTHROID ) 137 MCG tablet Take 1 tablet (137 mcg total) by mouth daily before  breakfast. 90 tablet 3   nitroGLYCERIN  (NITROSTAT ) 0.4 MG SL tablet Place 1 tablet (0.4 mg total) under the tongue every 5 (five) minutes as needed for chest pain. 25 tablet 0   telmisartan  (MICARDIS ) 80 MG tablet Take 1 tablet (80 mg total) by mouth daily. 90 tablet 1   No current facility-administered medications on file prior to visit.   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   Type 2 diabetes mellitus (HCC)    Unspecified hearing loss, bilateral    Ventricular fibrillation [facility])    Past Surgical History:  Procedure Laterality Date   ANTERIOR CERVICAL DECOMP/DISCECTOMY FUSION N/A 02/17/2022   Procedure: ANTERIOR CERVICAL DECOMPRESSION/DISCECTOMY FUSION  CERVICAL FIVE-SEVEN;  Surgeon: Mort Ards, MD;  Location: [facility] OR;  Service: Orthopedics;  Laterality: N/A;   arm surgery  05/2014   CARDIAC CATHETERIZATION  2019   one stent placed   CHOLECYSTECTOMY  02/2016   COLONOSCOPY  2022   HERNIA REPAIR     1999-umbilical   NASAL SINUS SURGERY  2000   tendon repair 11/2014      Family History  Problem Relation Age of Onset   Breast cancer Mother    Melanoma Father    Diabetes Sister    Arrhythmia Brother    Congestive Heart Failure Brother    Heart attack Brother    Atrial fibrillation Brother    Colon cancer Neg Hx    Stomach cancer Neg Hx    Esophageal cancer Neg Hx    Social History   Socioeconomic History   Marital status: Married    Spouse name: Not on file   Number of children: 1   Years of education: Not on file   Highest education level: Not on file  Occupational History   Occupation: Production designer, theatre/television/film   Occupation: Transport planner  Tobacco Use   Smoking status: Never   Smokeless tobacco: Never  Vaping Use   Vaping status: Never Used  Substance and Sexual Activity   Alcohol use: Not Currently   Drug use: No   Sexual activity: Yes    Partners: Female  Other Topics Concern   Not on file  Social History Narrative   Not on file   Social Drivers of Health   Financial Resource Strain: Low Risk  (02/03/2023)   Overall Financial Resource Strain (CARDIA)    Difficulty of Paying Living Expenses: Not hard at all  Food Insecurity: Low Risk  (10/27/2023)   Received from [facility]   Hunger Vital Sign     Within the past 12 months, you worried that your food would run out before you got money to buy more: Never true    Within the past 12 months, the food you bought just didn't last and you didn't  have money to get more: Not on file  Transportation Needs: No Transportation Needs (02/03/2023)   PRAPARE - Administrator, Civil Service (Medical): No    Lack of Transportation (Non-Medical): No  Physical Activity: Insufficiently Active (02/03/2023)   Exercise Vital Sign    Days of Exercise per Week: 2 days    Minutes of Exercise per Session: 40 min  Stress: No Stress Concern Present (02/03/2023)   [employer] of Occupational Health - Occupational Stress Questionnaire    Feeling of Stress : Not at all  Social Connections: Moderately Isolated (02/03/2023)   Social Connection and Isolation Panel    Frequency of Communication with Friends and Family: More than three times a week    Frequency of Social Gatherings with Friends and Family: More than three times a week    Attends Religious Services: Never    Database administrator or Organizations: No    Attends Banker Meetings: Never    Marital Status: Married    Objective:  There were no vitals taken for this visit.     11/13/2023    2:02 AM 11/12/2023   10:54 PM 11/12/2023   10:44 PM  BP/Weight  Systolic BP 154 162   Diastolic BP 75 79   Wt. (Lbs)   260  BMI   38.4 kg/m2    Physical Exam  {Perform Simple Foot Exam  Perform Detailed exam:1} {Insert foot Exam (Optional):30965}   Lab Results  Component Value Date   WBC 5.9 09/13/2023   HGB 14.8 09/13/2023   HCT 43.4 09/13/2023   PLT 170.0 09/13/2023   GLUCOSE 157 (H) 09/13/2023   CHOL 112 08/10/2023   TRIG 81 08/10/2023   HDL 40 08/10/2023   LDLCALC 56 08/10/2023   ALT 39 09/13/2023   AST 28 09/13/2023   NA 139 09/13/2023   K 3.9 09/13/2023   CL 103 09/13/2023   CREATININE 0.79 09/13/2023   BUN 7 09/13/2023   CO2 29 09/13/2023   TSH 4.59  09/13/2023   INR 1.2 (H) 09/13/2023   HGBA1C 6.1 (H) 08/10/2023   MICROALBUR 30 07/29/2020      Assessment & Plan:  Hypertensive heart disease without heart failure  OSA on CPAP  Diabetic glomerulopathy (HCC)  Mixed hyperlipidemia     No orders of the defined types were placed in this encounter.   No orders of the defined types were placed in this encounter.    Follow-up: No follow-ups on file.   I,Marla I Leal-Borjas,acting as a scribe for Mercy Stall, MD.,have documented all relevant documentation on the behalf of Mercy Stall, MD,as directed by  Mercy Stall, MD while in the presence of Mercy Stall, MD.   An After Visit Summary was printed and given to the patient.  Mercy Stall, MD [facility] [phone]

## 2023-11-13 NOTE — Progress Notes (Signed)
 Orthopedic Tech Progress Note Patient Details:  Gerald Jordan 1961-04-25 [id number]  Ortho Devices Type of Ortho Device: Arm sling, Sugartong splint Ortho Device/Splint Location: lue Ortho Device/Splint Interventions: Ordered, Adjustment, Application  I spoke with the dr before seeing the pt asking if I could do a sugartong instead since it would treat the fx better. The dr said that would be ok. I then went to the pt and applied the splint. Post Interventions Patient Tolerated: Well Instructions Provided: Care of device, Adjustment of device  Terryann Fiddler 11/13/2023, 1:43 AM

## 2023-11-14 ENCOUNTER — Ambulatory Visit: Admitting: Family Medicine

## 2023-11-14 ENCOUNTER — Encounter: Payer: Self-pay | Admitting: Family Medicine

## 2023-11-14 VITALS — BP 130/72 | HR 60 | Temp 98.3°F | Ht 69.0 in | Wt 275.0 lb

## 2023-11-14 DIAGNOSIS — F3181 Bipolar II disorder: Secondary | ICD-10-CM

## 2023-11-14 DIAGNOSIS — Z6841 Body Mass Index (BMI) 40.0 and over, adult: Secondary | ICD-10-CM

## 2023-11-14 DIAGNOSIS — K760 Fatty (change of) liver, not elsewhere classified: Secondary | ICD-10-CM

## 2023-11-14 DIAGNOSIS — I25118 Atherosclerotic heart disease of native coronary artery with other forms of angina pectoris: Secondary | ICD-10-CM

## 2023-11-14 DIAGNOSIS — E038 Other specified hypothyroidism: Secondary | ICD-10-CM

## 2023-11-14 DIAGNOSIS — S62102A Fracture of unspecified carpal bone, left wrist, initial encounter for closed fracture: Secondary | ICD-10-CM | POA: Insufficient documentation

## 2023-11-14 DIAGNOSIS — I119 Hypertensive heart disease without heart failure: Secondary | ICD-10-CM | POA: Diagnosis not present

## 2023-11-14 DIAGNOSIS — E1121 Type 2 diabetes mellitus with diabetic nephropathy: Secondary | ICD-10-CM | POA: Diagnosis not present

## 2023-11-14 DIAGNOSIS — I700 Atherosclerosis of aorta: Secondary | ICD-10-CM

## 2023-11-14 DIAGNOSIS — E782 Mixed hyperlipidemia: Secondary | ICD-10-CM | POA: Diagnosis not present

## 2023-11-14 DIAGNOSIS — G4733 Obstructive sleep apnea (adult) (pediatric): Secondary | ICD-10-CM | POA: Diagnosis not present

## 2023-11-14 DIAGNOSIS — E66813 Obesity, class 3: Secondary | ICD-10-CM

## 2023-11-14 LAB — CBC WITH DIFFERENTIAL/PLATELET
Basophils Absolute: 0 10*3/uL (ref 0.0–0.2)
Basos: 0 %
EOS (ABSOLUTE): 0.1 10*3/uL (ref 0.0–0.4)
Eos: 2 %
Hematocrit: 42 % (ref 37.5–51.0)
Hemoglobin: 14 g/dL (ref 13.0–17.7)
Immature Grans (Abs): 0 10*3/uL (ref 0.0–0.1)
Immature Granulocytes: 0 %
Lymphocytes Absolute: 1.6 10*3/uL (ref 0.7–3.1)
Lymphs: 31 %
MCH: 31 pg (ref 26.6–33.0)
MCHC: 33.3 g/dL (ref 31.5–35.7)
MCV: 93 fL (ref 79–97)
Monocytes Absolute: 0.7 10*3/uL (ref 0.1–0.9)
Monocytes: 13 %
Neutrophils Absolute: 2.8 10*3/uL (ref 1.4–7.0)
Neutrophils: 54 %
Platelets: 140 10*3/uL — ABNORMAL LOW (ref 150–450)
RBC: 4.51 x10E6/uL (ref 4.14–5.80)
RDW: 12.6 % (ref 11.6–15.4)
WBC: 5.2 10*3/uL (ref 3.4–10.8)

## 2023-11-14 LAB — LIPID PANEL
Chol/HDL Ratio: 2.9 ratio (ref 0.0–5.0)
Cholesterol, Total: 103 mg/dL (ref 100–199)
HDL: 36 mg/dL — ABNORMAL LOW (ref >39)
LDL Chol Calc (NIH): 50 mg/dL (ref 0–99)
Triglycerides: 83 mg/dL (ref 0–149)
VLDL Cholesterol Cal: 17 mg/dL (ref 5–40)

## 2023-11-14 LAB — COMPREHENSIVE METABOLIC PANEL WITH GFR
ALT: 29 IU/L (ref 0–44)
AST: 20 IU/L (ref 0–40)
Albumin: 4.3 g/dL (ref 3.9–4.9)
Alkaline Phosphatase: 94 IU/L (ref 44–121)
BUN/Creatinine Ratio: 13 (ref 10–24)
BUN: 10 mg/dL (ref 8–27)
Bilirubin Total: 1.5 mg/dL — ABNORMAL HIGH (ref 0.0–1.2)
CO2: 22 mmol/L (ref 20–29)
Calcium: 8.6 mg/dL (ref 8.6–10.2)
Chloride: 101 mmol/L (ref 96–106)
Creatinine, Ser: 0.79 mg/dL (ref 0.76–1.27)
Globulin, Total: 1.6 g/dL (ref 1.5–4.5)
Glucose: 173 mg/dL — ABNORMAL HIGH (ref 70–99)
Potassium: 4.3 mmol/L (ref 3.5–5.2)
Sodium: 139 mmol/L (ref 134–144)
Total Protein: 5.9 g/dL — ABNORMAL LOW (ref 6.0–8.5)
eGFR: 100 mL/min/{1.73_m2} (ref >59)

## 2023-11-14 LAB — HEMOGLOBIN A1C
Est. average glucose Bld gHb Est-mCnc: 146 mg/dL
Hgb A1c MFr Bld: 6.7 % — ABNORMAL HIGH (ref 4.8–5.6)

## 2023-11-14 MED ORDER — NALTREXONE-BUPROPION HCL ER 8-90 MG PO TB12: ORAL_TABLET | ORAL | 0 refills | Status: DC

## 2023-11-14 NOTE — Assessment & Plan Note (Signed)
 Control: well controlled Recommend check feet daily. Recommend annual eye exams. Medicines:  none Continue to work on eating a healthy diet and exercise.  Labs drawn today.

## 2023-11-14 NOTE — Assessment & Plan Note (Signed)
 Keep appt with Dr. Ortman

## 2023-11-14 NOTE — Assessment & Plan Note (Signed)
Continue lipitor 80 mg daily. Heart healthy diet. Labs drawn.

## 2023-11-14 NOTE — Assessment & Plan Note (Signed)
Recommend continue to work on eating healthy diet and exercise. Continue contrave Comorbidities include diabetes, and coronary artery disease

## 2023-11-14 NOTE — Assessment & Plan Note (Signed)
 The current medical regimen is effective;  continue present plan and medications. Continue lamictal  and citalopram .

## 2023-11-14 NOTE — Assessment & Plan Note (Signed)
Well-controlled. Continue carvedilol, aspirin, Lipitor. Follow-up with cardiology.

## 2023-11-14 NOTE — Assessment & Plan Note (Signed)
Continue lipitor and aspirin.  ?

## 2023-11-14 NOTE — Assessment & Plan Note (Signed)
Recommend continue to work on eating healthy diet and exercise. ?Recommend weight loss.  ?

## 2023-11-14 NOTE — Assessment & Plan Note (Signed)
 Previously well controlled Continue Synthroid at current dose

## 2023-11-14 NOTE — Patient Instructions (Addendum)
 Start CONTRAVE  as follows: Start 1 tablet every morning for 7 days, then 1 tablet twice daily for 7 days, then 2 tablets every morning and one in the evening, then 2 in am and 2 in pm.   Prescription ordered to send to your mail order on 11/28/2023. Please let us  know if you are not tolerating before this. If the cost is too much, also let us  know.   Start a stool softener to offset possible  constipation.

## 2023-11-14 NOTE — Assessment & Plan Note (Signed)
Well controlled.  No changes to medicines. Telmisartan 80 mg once daily, coreg 6.25 mg one twice daily.  Continue to work on eating a healthy diet and exercise.  Labs drawn today.

## 2023-11-14 NOTE — Assessment & Plan Note (Signed)
 Continued compliance and benefiting from the use of  CPAP.

## 2023-11-16 ENCOUNTER — Other Ambulatory Visit: Payer: Self-pay

## 2023-11-16 ENCOUNTER — Ambulatory Visit: Payer: Self-pay | Admitting: Family Medicine

## 2023-11-16 DIAGNOSIS — R7989 Other specified abnormal findings of blood chemistry: Secondary | ICD-10-CM

## 2023-12-11 ENCOUNTER — Other Ambulatory Visit

## 2023-12-11 DIAGNOSIS — R7989 Other specified abnormal findings of blood chemistry: Secondary | ICD-10-CM

## 2023-12-11 LAB — CBC WITH DIFFERENTIAL/PLATELET
Basophils Absolute: 0 x10E3/uL (ref 0.0–0.2)
Basos: 0 %
EOS (ABSOLUTE): 0.1 x10E3/uL (ref 0.0–0.4)
Eos: 1 %
Hematocrit: 45.6 % (ref 37.5–51.0)
Hemoglobin: 15.3 g/dL (ref 13.0–17.7)
Immature Grans (Abs): 0 x10E3/uL (ref 0.0–0.1)
Immature Granulocytes: 0 %
Lymphocytes Absolute: 1.7 x10E3/uL (ref 0.7–3.1)
Lymphs: 31 %
MCH: 31.5 pg (ref 26.6–33.0)
MCHC: 33.6 g/dL (ref 31.5–35.7)
MCV: 94 fL (ref 79–97)
Monocytes Absolute: 0.6 x10E3/uL (ref 0.1–0.9)
Monocytes: 11 %
Neutrophils Absolute: 3 x10E3/uL (ref 1.4–7.0)
Neutrophils: 56 %
Platelets: 173 x10E3/uL (ref 150–450)
RBC: 4.85 x10E6/uL (ref 4.14–5.80)
RDW: 13.1 % (ref 11.6–15.4)
WBC: 5.3 x10E3/uL (ref 3.4–10.8)

## 2023-12-12 ENCOUNTER — Ambulatory Visit: Payer: Self-pay | Admitting: Family Medicine

## 2023-12-18 ENCOUNTER — Other Ambulatory Visit: Payer: Self-pay

## 2023-12-18 DIAGNOSIS — H4089 Other specified glaucoma: Secondary | ICD-10-CM

## 2024-02-25 NOTE — Assessment & Plan Note (Signed)
 [name]

## 2024-02-25 NOTE — Progress Notes (Unsigned)
 Subjective:  Patient ID: Gerald Jordan, male    DOB: 04/17/1961  Age: 63 y.o. MRN: [id number]  No chief complaint on file.   HPI: Discussed the use of AI scribe software for clinical note transcription with the patient, who gave verbal consent to proceed.  History of Present Illness        11/14/2023    8:05 AM 10/17/2023   10:27 AM 08/10/2023    9:04 AM 05/08/2023    8:13 AM 01/06/2023    7:30 AM  Depression screen PHQ 2/9  Decreased Interest 0 0 0 0 0  Down, Depressed, Hopeless 0 0 0 0 0  PHQ - 2 Score 0 0 0 0 0  Altered sleeping 0  0 0 0  Tired, decreased energy 0  1 1 0  Change in appetite 0  0 1 0  Feeling bad or failure about yourself  0  0 0 0  Trouble concentrating 0  0 0 0  Moving slowly or fidgety/restless 0  0 0 0  Suicidal thoughts 0  0 0 0  PHQ-9 Score 0  1 2 0  Difficult doing work/chores Not difficult at all  Not difficult at all Not difficult at all Not difficult at all        10/17/2023   10:27 AM  Fall Risk   Falls in the past year? 0  Number falls in past yr: 0  Injury with Fall? 0  Risk for fall due to : No Fall Risks    Patient Care Team: Sherre Clapper, MD as PCP - General (Family Medicine) Ablott, Roselie (Optometry)   Review of Systems  Current Outpatient Medications on File Prior to Visit  Medication Sig Dispense Refill   aspirin 81 MG tablet Take 81 mg by mouth daily.     atorvastatin  (LIPITOR ) 80 MG tablet Take 1 tablet (80 mg total) by mouth daily. 90 tablet 3   Blood Glucose Monitoring Suppl DEVI 1 each by Does not apply route in the morning, at noon, and at bedtime. May substitute to any manufacturer covered by patient's insurance. 1 each 0   carvedilol  (COREG ) 6.25 MG tablet Take 1 tablet (6.25 mg total) by mouth 2 (two) times daily. 180 tablet 3   citalopram  (CELEXA ) 20 MG tablet Take 1 tablet (20 mg total) by mouth daily. 90 tablet 3   fluticasone  (FLONASE ) 50 MCG/ACT nasal spray Place 2 sprays into both nostrils daily. 16 g 6    Glucose Blood (BLOOD GLUCOSE TEST STRIPS) STRP 1 each by In Vitro route in the morning, at noon, and at bedtime. May substitute to any manufacturer covered by patient's insurance. 100 each 11   lamoTRIgine  (LAMICTAL ) 150 MG tablet Take 2 tablets (300 mg total) by mouth daily. 180 tablet 3   latanoprost  (XALATAN ) 0.005 % ophthalmic solution INSTILL 1 DROP INTO BOTH EYES  EVERY NIGHT 7.5 mL 3   levothyroxine  (SYNTHROID ) 137 MCG tablet Take 1 tablet (137 mcg total) by mouth daily before breakfast. 90 tablet 3   Naltrexone -buPROPion  HCl ER 8-90 MG TB12 2 oral twice daily. 360 tablet 0   nitroGLYCERIN  (NITROSTAT ) 0.4 MG SL tablet Place 1 tablet (0.4 mg total) under the tongue every 5 (five) minutes as needed for chest pain. 25 tablet 0   telmisartan  (MICARDIS ) 80 MG tablet Take 1 tablet (80 mg total) by mouth daily. 90 tablet 1   No current facility-administered medications on file prior to visit.   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   Type 2 diabetes mellitus (HCC)    Unspecified hearing loss, bilateral    Ventricular fibrillation [facility])    Past Surgical History:  Procedure Laterality Date   ANTERIOR CERVICAL DECOMP/DISCECTOMY FUSION N/A 02/17/2022   Procedure: ANTERIOR CERVICAL DECOMPRESSION/DISCECTOMY  FUSION  CERVICAL FIVE-SEVEN;  Surgeon: Burnetta Aures, MD;  Location: [facility] OR;  Service: Orthopedics;  Laterality: N/A;   arm surgery  05/2014   CARDIAC CATHETERIZATION  2019   one stent placed   CHOLECYSTECTOMY  02/2016   COLONOSCOPY  2022   HERNIA REPAIR     1999-umbilical   NASAL SINUS SURGERY  2000   tendon repair 11/2014      Family History  Problem Relation Age of Onset   Breast cancer Mother    Melanoma Father    Diabetes Sister    Arrhythmia Brother    Congestive Heart Failure Brother    Heart attack Brother    Atrial fibrillation Brother    Colon cancer Neg Hx    Stomach cancer Neg Hx    Esophageal cancer Neg Hx    Social History   Socioeconomic History   Marital status: Married    Spouse name: Not on file   Number of children: 1   Years of education: Not on file   Highest education level: Not on file  Occupational History   Occupation: Production designer, theatre/television/film   Occupation: Transport planner  Tobacco Use   Smoking status: Never   Smokeless tobacco: Never  Vaping Use   Vaping status: Never Used  Substance and Sexual Activity   Alcohol use: Not Currently   Drug use: No   Sexual activity: Yes    Partners: Female  Other Topics Concern   Not on file  Social History Narrative   Not on file   Social Drivers of Health   Financial Resource Strain: Low Risk  (02/03/2023)   Overall Financial Resource Strain (CARDIA)    Difficulty of Paying Living Expenses: Not hard at all  Food Insecurity: Low Risk  (10/27/2023)   Received from [facility]   Hunger Vital Sign    Within the past 12 months, you worried that your food would run out before you got money to buy more: Never true    Within the past 12 months, the food you bought just didn't last and you didn't have money to get more: Not on file  Transportation Needs: No Transportation Needs (11/14/2023)   PRAPARE - Administrator, Civil Service (Medical): No    Lack of Transportation (Non-Medical): No  Physical Activity:  Insufficiently Active (02/03/2023)   Exercise Vital Sign    Days of Exercise per Week: 2 days    Minutes  of Exercise per Session: 40 min  Stress: No Stress Concern Present (02/03/2023)   [employer] of Occupational Health - Occupational Stress Questionnaire    Feeling of Stress : Not at all  Social Connections: Moderately Isolated (02/03/2023)   Social Connection and Isolation Panel    Frequency of Communication with Friends and Family: More than three times a week    Frequency of Social Gatherings with Friends and Family: More than three times a week    Attends Religious Services: Never    Database administrator or Organizations: No    Attends Banker Meetings: Never    Marital Status: Married    Objective:  There were no vitals taken for this visit.     11/14/2023    8:04 AM 11/13/2023    2:02 AM 11/12/2023   10:54 PM  BP/Weight  Systolic BP 130 154 162  Diastolic BP 72 75 79  Wt. (Lbs) 275    BMI 40.61 kg/m2      Physical Exam  {Perform Simple Foot Exam  Perform Detailed exam:1} {Insert foot Exam (Optional):30965}   Lab Results  Component Value Date   WBC 5.3 12/11/2023   HGB 15.3 12/11/2023   HCT 45.6 12/11/2023   PLT 173 12/11/2023   GLUCOSE 173 (H) 11/14/2023   CHOL 103 11/14/2023   TRIG 83 11/14/2023   HDL 36 (L) 11/14/2023   LDLCALC 50 11/14/2023   ALT 29 11/14/2023   AST 20 11/14/2023   NA 139 11/14/2023   K 4.3 11/14/2023   CL 101 11/14/2023   CREATININE 0.79 11/14/2023   BUN 10 11/14/2023   CO2 22 11/14/2023   TSH 4.59 09/13/2023   INR 1.2 (H) 09/13/2023   HGBA1C 6.7 (H) 11/14/2023    Results for orders placed or performed in visit on 12/11/23  CBC with Differential/Platelet   Collection Time: 12/11/23  8:49 AM  Result Value Ref Range   WBC 5.3 3.4 - 10.8 x10E3/uL   RBC 4.85 4.14 - 5.80 x10E6/uL   Hemoglobin 15.3 13.0 - 17.7 g/dL   Hematocrit 54.3 62.4 - 51.0 %   MCV 94 79 - 97 fL   MCH 31.5 26.6 - 33.0 pg   MCHC 33.6 31.5 -  35.7 g/dL   RDW 86.8 88.3 - 84.5 %   Platelets 173 150 - 450 x10E3/uL   Neutrophils 56 Not Estab. %   Lymphs 31 Not Estab. %   Monocytes 11 Not Estab. %   Eos 1 Not Estab. %   Basos 0 Not Estab. %   Neutrophils Absolute 3.0 1.4 - 7.0 x10E3/uL   Lymphocytes Absolute 1.7 0.7 - 3.1 x10E3/uL   Monocytes Absolute 0.6 0.1 - 0.9 x10E3/uL   EOS (ABSOLUTE) 0.1 0.0 - 0.4 x10E3/uL   Basophils Absolute 0.0 0.0 - 0.2 x10E3/uL   Immature Granulocytes 0 Not Estab. %   Immature Grans (Abs) 0.0 0.0 - 0.1 x10E3/uL  .  Assessment & Plan:   Assessment & Plan Mixed hyperlipidemia     Diabetic glomerulopathy (HCC)     Secondary hypothyroidism     Coronary artery disease of native artery of native heart with stable angina pectoris     Fatty liver     OSA on CPAP     Bipolar 2 disorder, major depressive episode (HCC)     Hypertensive heart disease without heart failure     Class 3 severe obesity due to excess calories with serious comorbidity and body mass index (BMI)  of 40.0 to 44.9 in adult       There is no height or weight on file to calculate BMI.  Assessment and Plan Assessment & Plan      No orders of the defined types were placed in this encounter.   No orders of the defined types were placed in this encounter.      Follow-up: No follow-ups on file.  An After Visit Summary was printed and given to the patient.  Abigail Free, MD [facility] [phone]

## 2024-02-25 NOTE — Assessment & Plan Note (Signed)
 Gerald Jordan

## 2024-02-26 ENCOUNTER — Ambulatory Visit: Admitting: Family Medicine

## 2024-02-26 ENCOUNTER — Encounter: Payer: Self-pay | Admitting: Family Medicine

## 2024-02-26 VITALS — BP 132/70 | HR 61 | Temp 97.8°F | Resp 18 | Ht 69.0 in | Wt 276.0 lb

## 2024-02-26 DIAGNOSIS — E66813 Obesity, class 3: Secondary | ICD-10-CM

## 2024-02-26 DIAGNOSIS — E782 Mixed hyperlipidemia: Secondary | ICD-10-CM

## 2024-02-26 DIAGNOSIS — K760 Fatty (change of) liver, not elsewhere classified: Secondary | ICD-10-CM

## 2024-02-26 DIAGNOSIS — E1121 Type 2 diabetes mellitus with diabetic nephropathy: Secondary | ICD-10-CM

## 2024-02-26 DIAGNOSIS — F3181 Bipolar II disorder: Secondary | ICD-10-CM

## 2024-02-26 DIAGNOSIS — E038 Other specified hypothyroidism: Secondary | ICD-10-CM | POA: Diagnosis not present

## 2024-02-26 DIAGNOSIS — I25118 Atherosclerotic heart disease of native coronary artery with other forms of angina pectoris: Secondary | ICD-10-CM

## 2024-02-26 DIAGNOSIS — Z6841 Body Mass Index (BMI) 40.0 and over, adult: Secondary | ICD-10-CM

## 2024-02-26 DIAGNOSIS — Z23 Encounter for immunization: Secondary | ICD-10-CM | POA: Diagnosis not present

## 2024-02-26 DIAGNOSIS — I119 Hypertensive heart disease without heart failure: Secondary | ICD-10-CM

## 2024-02-26 DIAGNOSIS — G4733 Obstructive sleep apnea (adult) (pediatric): Secondary | ICD-10-CM

## 2024-02-26 LAB — CBC WITH DIFFERENTIAL/PLATELET
Basophils Absolute: 0 x10E3/uL (ref 0.0–0.2)
Basos: 0 %
EOS (ABSOLUTE): 0.1 x10E3/uL (ref 0.0–0.4)
Eos: 1 %
Hematocrit: 44.6 % (ref 37.5–51.0)
Hemoglobin: 14.9 g/dL (ref 13.0–17.7)
Immature Grans (Abs): 0 x10E3/uL (ref 0.0–0.1)
Immature Granulocytes: 0 %
Lymphocytes Absolute: 1.9 x10E3/uL (ref 0.7–3.1)
Lymphs: 33 %
MCH: 31.2 pg (ref 26.6–33.0)
MCHC: 33.4 g/dL (ref 31.5–35.7)
MCV: 94 fL (ref 79–97)
Monocytes Absolute: 0.6 x10E3/uL (ref 0.1–0.9)
Monocytes: 10 %
Neutrophils Absolute: 3.2 x10E3/uL (ref 1.4–7.0)
Neutrophils: 55 %
Platelets: 176 x10E3/uL (ref 150–450)
RBC: 4.77 x10E6/uL (ref 4.14–5.80)
RDW: 12.7 % (ref 11.6–15.4)
WBC: 5.8 x10E3/uL (ref 3.4–10.8)

## 2024-02-26 LAB — LIPID PANEL
Chol/HDL Ratio: 3 ratio (ref 0.0–5.0)
Cholesterol, Total: 105 mg/dL (ref 100–199)
HDL: 35 mg/dL — ABNORMAL LOW (ref >39)
LDL Chol Calc (NIH): 49 mg/dL (ref 0–99)
Triglycerides: 116 mg/dL (ref 0–149)
VLDL Cholesterol Cal: 21 mg/dL (ref 5–40)

## 2024-02-26 LAB — POCT GLYCOSYLATED HEMOGLOBIN (HGB A1C): HbA1c POC (<> result, manual entry): 7 % (ref 4.0–5.6)

## 2024-02-26 LAB — COMPREHENSIVE METABOLIC PANEL WITH GFR
ALT: 41 IU/L (ref 0–44)
AST: 29 IU/L (ref 0–40)
Albumin: 4.4 g/dL (ref 3.9–4.9)
Alkaline Phosphatase: 94 IU/L (ref 47–123)
BUN/Creatinine Ratio: 9 — ABNORMAL LOW (ref 10–24)
BUN: 8 mg/dL (ref 8–27)
Bilirubin Total: 1 mg/dL (ref 0.0–1.2)
CO2: 24 mmol/L (ref 20–29)
Calcium: 9.1 mg/dL (ref 8.6–10.2)
Chloride: 99 mmol/L (ref 96–106)
Creatinine, Ser: 0.85 mg/dL (ref 0.76–1.27)
Globulin, Total: 1.8 g/dL (ref 1.5–4.5)
Glucose: 188 mg/dL — ABNORMAL HIGH (ref 70–99)
Potassium: 4.3 mmol/L (ref 3.5–5.2)
Sodium: 139 mmol/L (ref 134–144)
Total Protein: 6.2 g/dL (ref 6.0–8.5)
eGFR: 98 mL/min/1.73 (ref >59)

## 2024-02-26 NOTE — Patient Instructions (Signed)
  VISIT SUMMARY: You visited us  today for wrist pain following a fall, and we discussed your diabetes management, weight gain, and overall health. We reviewed your current medications and made plans for further tests and lifestyle changes to improve your health.  YOUR PLAN: LEFT WRIST FRACTURE WITH NEUROPATHIC SYMPTOMS: You have a wrist fracture with burning sensations in your hand, especially when you use it. -We will schedule an MRI to check for any nerve involvement or complications.  TYPE 2 DIABETES MELLITUS WITH DIABETIC NEPHROPATHY: Your diabetes is currently under control with an A1c of 7%, but this has worsened.  -Focus on diet and exercise to help improve your A1c. -We may consider adding medication if lifestyle changes are not enough.  OBESITY, CLASS 3: Your weight has increased recently, and previous weight loss medications caused side effects. -Increase your physical activity, such as walking at [facility]. -Make dietary changes to reduce your intake of sweets.  ATHEROSCLEROTIC HEART DISEASE OF NATIVE CORONARY ARTERY WITH ANGINA: Your heart disease is managed with medications, and you have not had any recent episodes of angina. -Continue taking atorvastatin , telmisartan , and carvedilol  as prescribed.  HYPERTENSIVE HEART DISEASE WITHOUT HEART FAILURE: Your blood pressure is managed with medications, and you have no symptoms of heart failure. -Continue taking telmisartan  and carvedilol  as prescribed.  MIXED HYPERLIPIDEMIA: Your cholesterol is managed with atorvastatin , but we need to recheck your levels due to recent weight gain.  OTHER SPECIFIED HYPOTHYROIDISM: Your hypothyroidism is managed with levothyroxine . -Continue taking levothyroxine  137 mcg daily as prescribed.                      Contains text generated by [employer].                                 Contains text generated by [employer]bridge.

## 2024-02-27 ENCOUNTER — Ambulatory Visit: Payer: Self-pay | Admitting: Family Medicine

## 2024-04-30 ENCOUNTER — Other Ambulatory Visit: Payer: Self-pay

## 2024-04-30 DIAGNOSIS — I25118 Atherosclerotic heart disease of native coronary artery with other forms of angina pectoris: Secondary | ICD-10-CM

## 2024-04-30 DIAGNOSIS — I252 Old myocardial infarction: Secondary | ICD-10-CM

## 2024-04-30 DIAGNOSIS — E782 Mixed hyperlipidemia: Secondary | ICD-10-CM

## 2024-04-30 DIAGNOSIS — E1159 Type 2 diabetes mellitus with other circulatory complications: Secondary | ICD-10-CM

## 2024-04-30 DIAGNOSIS — I119 Hypertensive heart disease without heart failure: Secondary | ICD-10-CM

## 2024-04-30 DIAGNOSIS — F330 Major depressive disorder, recurrent, mild: Secondary | ICD-10-CM

## 2024-04-30 MED ORDER — CITALOPRAM HYDROBROMIDE 20 MG PO TABS: 20.0000 mg | ORAL_TABLET | Freq: Every day | ORAL | 3 refills | Status: DC

## 2024-04-30 MED ORDER — ATORVASTATIN CALCIUM 80 MG PO TABS: 80.0000 mg | ORAL_TABLET | Freq: Every day | ORAL | 3 refills | Status: DC

## 2024-04-30 MED ORDER — TELMISARTAN 80 MG PO TABS: 80.0000 mg | ORAL_TABLET | Freq: Every day | ORAL | 3 refills | Status: DC

## 2024-04-30 MED ORDER — LAMOTRIGINE 150 MG PO TABS: 300.0000 mg | ORAL_TABLET | Freq: Every day | ORAL | 3 refills | Status: DC

## 2024-04-30 MED ORDER — LEVOTHYROXINE SODIUM 137 MCG PO TABS: 137.0000 ug | ORAL_TABLET | Freq: Every day | ORAL | 3 refills | Status: DC

## 2024-04-30 MED ORDER — CARVEDILOL 6.25 MG PO TABS: 6.2500 mg | ORAL_TABLET | Freq: Two times a day (BID) | ORAL | 3 refills | Status: DC

## 2024-05-07 ENCOUNTER — Other Ambulatory Visit: Payer: Self-pay

## 2024-05-07 DIAGNOSIS — I152 Hypertension secondary to endocrine disorders: Secondary | ICD-10-CM

## 2024-05-07 DIAGNOSIS — E782 Mixed hyperlipidemia: Secondary | ICD-10-CM

## 2024-05-07 DIAGNOSIS — I119 Hypertensive heart disease without heart failure: Secondary | ICD-10-CM

## 2024-05-07 DIAGNOSIS — I25118 Atherosclerotic heart disease of native coronary artery with other forms of angina pectoris: Secondary | ICD-10-CM

## 2024-05-07 DIAGNOSIS — F330 Major depressive disorder, recurrent, mild: Secondary | ICD-10-CM

## 2024-05-07 DIAGNOSIS — I252 Old myocardial infarction: Secondary | ICD-10-CM

## 2024-05-07 MED ORDER — CARVEDILOL 6.25 MG PO TABS: 6.2500 mg | ORAL_TABLET | Freq: Two times a day (BID) | ORAL | 3 refills | Status: DC

## 2024-05-07 MED ORDER — LAMOTRIGINE 150 MG PO TABS: 300.0000 mg | ORAL_TABLET | Freq: Every day | ORAL | 3 refills | Status: AC

## 2024-05-07 MED ORDER — TELMISARTAN 80 MG PO TABS: 80.0000 mg | ORAL_TABLET | Freq: Every day | ORAL | 3 refills | Status: AC

## 2024-05-07 MED ORDER — CARVEDILOL 6.25 MG PO TABS: 6.2500 mg | ORAL_TABLET | Freq: Two times a day (BID) | ORAL | 0 refills | Status: DC

## 2024-05-07 MED ORDER — ATORVASTATIN CALCIUM 80 MG PO TABS: 80.0000 mg | ORAL_TABLET | Freq: Every day | ORAL | 3 refills | Status: AC

## 2024-05-07 MED ORDER — LEVOTHYROXINE SODIUM 137 MCG PO TABS: 137.0000 ug | ORAL_TABLET | Freq: Every day | ORAL | 3 refills | Status: AC

## 2024-05-07 MED ORDER — CITALOPRAM HYDROBROMIDE 20 MG PO TABS: 20.0000 mg | ORAL_TABLET | Freq: Every day | ORAL | 3 refills | Status: AC

## 2024-05-28 ENCOUNTER — Encounter: Payer: Self-pay | Admitting: Family Medicine

## 2024-05-28 ENCOUNTER — Ambulatory Visit: Admitting: Family Medicine

## 2024-05-28 ENCOUNTER — Telehealth: Payer: Self-pay

## 2024-05-28 VITALS — BP 142/78 | HR 66 | Temp 98.0°F | Resp 16 | Ht 69.0 in | Wt 277.3 lb

## 2024-05-28 DIAGNOSIS — E782 Mixed hyperlipidemia: Secondary | ICD-10-CM

## 2024-05-28 DIAGNOSIS — Z6841 Body Mass Index (BMI) 40.0 and over, adult: Secondary | ICD-10-CM | POA: Diagnosis not present

## 2024-05-28 DIAGNOSIS — F3181 Bipolar II disorder: Secondary | ICD-10-CM | POA: Diagnosis not present

## 2024-05-28 DIAGNOSIS — E66813 Obesity, class 3: Secondary | ICD-10-CM

## 2024-05-28 DIAGNOSIS — G4733 Obstructive sleep apnea (adult) (pediatric): Secondary | ICD-10-CM | POA: Diagnosis not present

## 2024-05-28 DIAGNOSIS — Z125 Encounter for screening for malignant neoplasm of prostate: Secondary | ICD-10-CM

## 2024-05-28 DIAGNOSIS — I119 Hypertensive heart disease without heart failure: Secondary | ICD-10-CM | POA: Diagnosis not present

## 2024-05-28 DIAGNOSIS — E1121 Type 2 diabetes mellitus with diabetic nephropathy: Secondary | ICD-10-CM | POA: Diagnosis not present

## 2024-05-28 DIAGNOSIS — I25118 Atherosclerotic heart disease of native coronary artery with other forms of angina pectoris: Secondary | ICD-10-CM

## 2024-05-28 DIAGNOSIS — K760 Fatty (change of) liver, not elsewhere classified: Secondary | ICD-10-CM

## 2024-05-28 DIAGNOSIS — E038 Other specified hypothyroidism: Secondary | ICD-10-CM | POA: Diagnosis not present

## 2024-05-28 LAB — POCT GLYCOSYLATED HEMOGLOBIN (HGB A1C): HbA1c POC (<> result, manual entry): 7.1 %

## 2024-05-28 MED ORDER — OZEMPIC (0.25 OR 0.5 MG/DOSE) 2 MG/3ML ~~LOC~~ SOPN: 0.5000 mg | PEN_INJECTOR | SUBCUTANEOUS | 0 refills | Status: DC

## 2024-05-28 NOTE — Assessment & Plan Note (Addendum)
 The current medical regimen is effective;  continue present plan and medications. Continue lamictal  and citalopram .

## 2024-05-28 NOTE — Telephone Encounter (Signed)
 Copied from CRM [id number]. Topic: General - Other >> May 28, 2024  1:04 PM Darshell M wrote: Reason for CRM: Patient requesting call back Dr. Stacy nurse. Patient CB# [phone]

## 2024-05-28 NOTE — Assessment & Plan Note (Addendum)
 Check labs Orders:   PSA

## 2024-05-28 NOTE — Assessment & Plan Note (Addendum)
 Obesity management complicated by adverse effects from weight loss medications. High activity level due to house building but lacks structured exercise. - Started Ozempic  as outlined for diabetes management, may aid weight loss. - Encouraged continued physical activity and healthy eating habits.

## 2024-05-28 NOTE — Patient Instructions (Addendum)
" °  VISIT SUMMARY: Today, we discussed your blood sugar and weight management, blood pressure concerns, and recent headaches. We also reviewed your history of skin cancer and resolved medication access issues.  YOUR PLAN: TYPE 2 DIABETES MELLITUS WITH DIABETIC NEPHROPATHY: Your blood sugar levels are higher than desired, and your A1c is at 7, indicating suboptimal control. -Start Ozempic  0.25 mg weekly for 4 weeks, then increase to 0.5 mg weekly. -Use the provided Ozempic  samples. -Take a stool softener if you experience constipation. -Increase your water intake. Recommend continue to work on eating healthy diet and exercise.   HYPERTENSIVE HEART DISEASE WITHOUT HEART FAILURE: Your blood pressure was elevated today, which is unusual except during hospital visits. -Recheck your blood pressure regularly. -Increase carvedilol  to 6.25 mg TWO twice daily.  CLASS 3 SEVERE OBESITY WITH COMORBIDITY:  - Trial on Ozempic  as outlined for diabetes management, which may also help with weight loss. -Maintain your physical activity and work on  healthy eating habits.  HISTORY OF SKIN CANCER SURVEILLANCE: You have a history of skin cancer treated four years ago and continue with annual dermatological exams. -Continue with your annual dermatological exams to monitor for any new issues.                      Contains text generated by Abridge.                                 Contains text generated by Abridge.   "

## 2024-05-28 NOTE — Assessment & Plan Note (Addendum)
 Continue lipitor  80 mg daily. Managed with atorvastatin .  Heart healthy diet. Increase exercise Orders:   CBC with Differential/Platelet   Comprehensive metabolic panel with GFR   Lipid panel   TSH

## 2024-05-28 NOTE — Progress Notes (Unsigned)
 "  Subjective:  Patient ID: Gerald Jordan, male    DOB: March 26, 1961  Age: 63 y.o. MRN: [id number]  Chief Complaint  Patient presents with   Medical Management of Chronic Issues    HPI: Discussed the use of AI scribe software for clinical note transcription with the patient, who gave verbal consent to proceed.  History of Present Illness Gerald Jordan is a 63 year old male with type 2 diabetes who presents for management of blood sugar and weight control.  Hyperglycemia and weight management - Difficulty managing blood glucose and weight - Expresses lack of willpower to control eating - Interested in medication to assist with weight and blood sugar control - Previously used Ozempic  at a lower dose, which reduced cravings - Experienced constipation with increased Ozempic  dose - Mounjaro  caused severe diarrhea - Last blood sugar measured at 150 mg/dL, higher than desired  CORONARY ARTERY DISEASE/HYPERTENSION - On aspirin 81 mg daily. atorvastatin , telmisartan , and carvedilol . Physical activity - Physically active due to involvement in building a house - Does not follow a structured exercise regimen - Attributes lack of regular exercise to personal motivation  DEPRESSION - citalopram  and lamictal .  - mood well controlled.  Headache and blood pressure concerns - Recent episode of pounding headache, resolved with ibuprofen - Not regularly checking blood pressure at home - No symptoms of blurry vision, chest pain, or shortness of breath - Blood pressure elevated today, which is unusual except during hospital visits  History of skin cancer surveillance - History of skin cancer treated four years ago - Continues annual dermatological exams - Most recent dermatology exam within the last month with no new issues identified  Medication access issues - Recent issue with medication supply due to change in pharmacy providers - Medication supply issue has been resolved        02/26/2024    8:04 AM 11/14/2023    8:05 AM 10/17/2023   10:27 AM 08/10/2023    9:04 AM 05/08/2023    8:13 AM  Depression screen PHQ 2/9  Decreased Interest 0 0 0 0 0  Down, Depressed, Hopeless 0 0 0 0 0  PHQ - 2 Score 0 0 0 0 0  Altered sleeping 1 0  0 0  Tired, decreased energy 1 0  1 1  Change in appetite 1 0  0 1  Feeling bad or failure about yourself  0 0  0 0  Trouble concentrating 0 0  0 0  Moving slowly or fidgety/restless 0 0  0 0  Suicidal thoughts 0 0  0 0  PHQ-9 Score 3  0   1  2   Difficult doing work/chores Not difficult at all Not difficult at all  Not difficult at all Not difficult at all     Data saved with a previous flowsheet row definition        02/26/2024    8:04 AM  Fall Risk   Falls in the past year? 1  Number falls in past yr: 0  Injury with Fall? 1   Risk for fall due to : No Fall Risks  Follow up Falls evaluation completed     Data saved with a previous flowsheet row definition    Patient Care Team: Sherre Clapper, MD as PCP - General (Family Medicine) Ablott, Roselie (Optometry)   Review of Systems  Constitutional:  Negative for chills, fatigue and fever.  HENT:  Negative for congestion, ear pain and sore throat.   Respiratory:  Negative for cough and shortness of breath.   Cardiovascular:  Negative for chest pain.  Gastrointestinal:  Negative for abdominal pain, constipation, diarrhea, nausea and vomiting.  Endocrine: Negative for polydipsia, polyphagia and polyuria.  Genitourinary:  Negative for dysuria and frequency.  Musculoskeletal:  Negative for arthralgias and myalgias.  Skin: Negative.   Neurological:  Negative for dizziness, light-headedness and headaches.  Hematological: Negative.   Psychiatric/Behavioral: Negative.  Negative for dysphoric mood.        No dysphoria    Medications Ordered Prior to Encounter[1] Past Medical History:  Diagnosis Date   Anxiety    Atherosclerotic heart disease of native coronary artery without  angina pectoris 2019   Ventricular fibrillation - cardioverted x 3 in ambulance. 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   Type 2 diabetes mellitus (HCC)    Unspecified hearing loss, bilateral    Ventricular fibrillation [facility])    Past Surgical History:  Procedure Laterality Date   ANTERIOR CERVICAL DECOMP/DISCECTOMY FUSION N/A 02/17/2022   Procedure: ANTERIOR CERVICAL DECOMPRESSION/DISCECTOMY FUSION  CERVICAL FIVE-SEVEN;  Surgeon: Burnetta Aures, MD;  Location: [facility] OR;  Service: Orthopedics;  Laterality: N/A;   arm surgery  05/2014   CARDIAC CATHETERIZATION  2019   one stent placed   CHOLECYSTECTOMY  02/2016   COLONOSCOPY  2022   HERNIA REPAIR     1999-umbilical   NASAL SINUS SURGERY  2000   tendon repair 11/2014      Family History  Problem Relation Age of Onset   Breast cancer Mother    Melanoma Father    Diabetes Sister    Arrhythmia Brother    Congestive Heart Failure Brother    Heart attack Brother    Atrial fibrillation Brother    Colon cancer Neg Hx    Stomach cancer Neg Hx    Esophageal cancer Neg Hx    Social History   Socioeconomic History   Marital status: Married    Spouse name: Not on file   Number of children: 1   Years of education: Not on file   Highest education level: Not on file  Occupational History   Occupation: production designer, theatre/television/film   Occupation: transport planner  Tobacco Use   Smoking status: Never   Smokeless tobacco: Never  Vaping Use   Vaping status: Never Used   Substance and Sexual Activity   Alcohol use: Not Currently   Drug use: No   Sexual activity: Yes    Partners: Female  Other Topics Concern   Not on file  Social History Narrative   Not on file   Social Drivers of Health   Tobacco Use: Low Risk (05/28/2024)   Patient History    Smoking Tobacco Use: Never    Smokeless Tobacco Use: Never    Passive Exposure: Not on file  Financial Resource Strain: Low Risk (02/03/2023)   Overall Financial Resource Strain (CARDIA)    Difficulty of Paying  Living Expenses: Not hard at all  Food Insecurity: Low Risk (10/27/2023)   Received from [facility]   Epic    Within the past 12 months, you worried that your food would run out before you got money to buy more: Never true    Within the past 12 months, the food you bought just didn't last and you didn't have money to get more: Not on file  Transportation Needs: No Transportation Needs (11/14/2023)   Epic    Lack of Transportation (Medical): No    Lack of Transportation (Non-Medical): No  Physical Activity: Insufficiently Active (02/03/2023)   Exercise Vital Sign    Days of Exercise per Week: 2 days    Minutes of Exercise per Session: 40 min  Stress: No Stress Concern Present (02/03/2023)   [employer] of Occupational Health - Occupational Stress Questionnaire    Feeling of Stress : Not at all  Social Connections: Moderately Isolated (02/03/2023)   Social Connection and Isolation Panel    Frequency of Communication with Friends and Family: More than three times a week    Frequency of Social Gatherings with Friends and Family: More than three times a week    Attends Religious Services: Never    Database Administrator or Organizations: No    Attends Banker Meetings: Never    Marital Status: Married  Depression (PHQ2-9): Low Risk (02/26/2024)   Depression (PHQ2-9)    PHQ-2 Score: 3  Alcohol Screen: Low Risk (06/10/2021)   Alcohol Screen    Last Alcohol Screening Score (AUDIT): 2   Housing: Low Risk (10/27/2023)   Received from [facility]   Epic    What is your living situation today?: I have a steady place to live    Think about the place you live. Do you have problems with any of the following? Choose all that apply:: Not on file  Utilities: Not At Risk (11/14/2023)   Epic    Threatened with loss of utilities: No  Health Literacy: Adequate Health Literacy (02/03/2023)   B1300 Health Literacy    Frequency of need for help with medical instructions: Never    Objective:  BP (!) 142/78   Pulse 66   Temp 98 F (36.7 C) (Temporal)   Resp 16   Ht 5' 9 (1.753 m)   Wt 277 lb 4.8 oz (125.8 kg)   SpO2 96%   BMI 40.95 kg/m      05/28/2024    9:09 AM 05/28/2024    8:18 AM 02/26/2024    8:02 AM  BP/Weight  Systolic BP 142 152 132  Diastolic BP 78 80 70  Wt. (Lbs)  277.3 276  BMI  40.95 kg/m2 40.76 kg/m2    Physical Exam Vitals reviewed.  Constitutional:      Appearance: Normal appearance. He is obese.  Neck:     Vascular: No carotid bruit.  Cardiovascular:     Rate and Rhythm: Normal rate and regular rhythm.     Pulses: Normal pulses.     Heart sounds: Normal heart sounds.  Pulmonary:     Effort: Pulmonary effort is normal.     Breath sounds: Normal breath sounds. No wheezing, rhonchi or rales.  Abdominal:     General: Bowel sounds are normal.     Palpations: Abdomen is soft.     Tenderness: There is no abdominal tenderness.  Neurological:     Mental Status: He is alert and oriented to person, place, and time.  Psychiatric:        Mood and Affect: Mood normal.        Behavior: Behavior normal.      Diabetic foot exam was performed with the following findings:   No deformities, ulcerations, or other skin breakdown Normal sensation of 10g monofilament Intact posterior tibialis and dorsalis pedis pulses      Lab Results  Component Value Date   WBC 5.8 02/26/2024   HGB 14.9 02/26/2024   HCT 44.6 02/26/2024   PLT 176 02/26/2024    GLUCOSE 188 (H) 02/26/2024   CHOL 105 02/26/2024   TRIG 116 02/26/2024   HDL 35 (L) 02/26/2024   LDLCALC 49 02/26/2024   ALT 41 02/26/2024   AST 29 02/26/2024   NA 139 02/26/2024   K 4.3 02/26/2024   CL 99 02/26/2024   CREATININE 0.85 02/26/2024   BUN 8 02/26/2024   CO2 24 02/26/2024   TSH 4.59 09/13/2023   INR 1.2 (H) 09/13/2023   HGBA1C 7.1 05/28/2024    Results for orders placed or performed in visit on 05/28/24  POCT glycosylated hemoglobin (Hb A1C)   Collection Time: 05/28/24 10:07 AM  Result Value Ref Range   Hemoglobin A1C     HbA1c POC (<> result, manual entry) 7.1 4.0 - 5.6 %   HbA1c, POC (prediabetic range)     HbA1c, POC (controlled diabetic range)    .  Assessment & Plan:   Assessment & Plan Mixed hyperlipidemia Continue lipitor  80 mg daily. Managed with atorvastatin .  Heart healthy diet. Increase exercise Orders:   CBC with Differential/Platelet   Comprehensive metabolic panel with GFR   Lipid panel   TSH   Diabetic glomerulopathy (HCC) HBA1C was 7.1 - Encourage regular blood sugar monitoring. - Emphasize importance of diet and exercise to improve A1c. - Retry ozempic . Control: worsened  Recommend check feet daily. Recommend annual eye exams.  Orders:   POCT glycosylated hemoglobin (Hb A1C)   Semaglutide ,0.25 or 0.5MG /DOS, (OZEMPIC , 0.25 OR 0.5 MG/DOSE,) 2 MG/3ML SOPN; Inject 0.5 mg into the skin once a week.   Secondary hypothyroidism Previously well controlled Continue Synthroid  at current dose  Managed with levothyroxine  137 mcg daily.     Coronary artery disease of native artery of native heart with stable angina pectoris Well-controlled. Follow-up with cardiology. Atherosclerotic heart disease managed with aspirin, atorvastatin , telmisartan , and carvedilol .       Fatty liver Recommend continue to work on eating healthy diet and exercise. Recommend weight loss.      OSA on CPAP Continued compliance and benefiting from the use  of  CPAP.     Bipolar 2 disorder, major depressive episode (HCC) The current medical regimen is effective;  continue present plan and medications. Continue lamictal  and citalopram .     Hypertensive heart disease without heart failure Not at goal  - Increased carvedilol  to 12.5 mg twice daily. Continue other bp medicines. .     Class 3 severe obesity due to excess calories with serious comorbidity and body mass index (BMI) of 40.0 to 44.9 in adult [facility]) Obesity management complicated by adverse effects from weight loss medications. High activity level due to house building but lacks structured exercise. - Started Ozempic  as outlined for diabetes management, may aid weight loss. - Encouraged continued physical activity and healthy eating habits.    Prostate cancer screening Check labs Orders:   PSA    Body mass index is 40.95 kg/m.    Meds ordered this encounter  Medications  Semaglutide ,0.25 or 0.5MG /DOS, (OZEMPIC , 0.25 OR 0.5 MG/DOSE,) 2 MG/3ML SOPN    Sig: Inject 0.5 mg into the skin once a week.    Dispense:  9 mL    Refill:  0    Orders Placed This Encounter  Procedures   CBC with Differential/Platelet   Comprehensive metabolic panel with GFR   Lipid panel   TSH   PSA   POCT glycosylated hemoglobin (Hb A1C)     I,Marla I Leal-Borjas,acting as a scribe for Abigail Free, MD.,have documented all relevant documentation on the behalf of Abigail Free, MD,as directed by  Abigail Free, MD while in the presence of Abigail Free, MD.   Follow-up: Return in about 4 weeks (around 06/25/2024) for cpe.  An After Visit Summary was printed and given to the patient.  I attest that I have reviewed this visit and agree with the plan scribed by my staff.   Abigail Free, MD [facility] [phone]       [1]  Current Outpatient Medications on File Prior to Visit  Medication Sig Dispense Refill   aspirin 81 MG tablet Take 81 mg by mouth daily.     atorvastatin   (LIPITOR ) 80 MG tablet Take 1 tablet (80 mg total) by mouth daily. 90 tablet 3   Blood Glucose Monitoring Suppl DEVI 1 each by Does not apply route in the morning, at noon, and at bedtime. May substitute to any manufacturer covered by patient's insurance. 1 each 0   carvedilol  (COREG ) 6.25 MG tablet Take 1 tablet (6.25 mg total) by mouth 2 (two) times daily. 180 tablet 3   citalopram  (CELEXA ) 20 MG tablet Take 1 tablet (20 mg total) by mouth daily. 90 tablet 3   fluticasone  (FLONASE ) 50 MCG/ACT nasal spray Place 2 sprays into both nostrils daily. 16 g 6   Glucose Blood (BLOOD GLUCOSE TEST STRIPS) STRP 1 each by In Vitro route in the morning, at noon, and at bedtime. May substitute to any manufacturer covered by patient's insurance. 100 each 11   lamoTRIgine  (LAMICTAL ) 150 MG tablet Take 2 tablets (300 mg total) by mouth daily. 180 tablet 3   latanoprost  (XALATAN ) 0.005 % ophthalmic solution INSTILL 1 DROP INTO BOTH EYES  EVERY NIGHT 7.5 mL 3   levothyroxine  (SYNTHROID ) 137 MCG tablet Take 1 tablet (137 mcg total) by mouth daily before breakfast. 90 tablet 3   nitroGLYCERIN  (NITROSTAT ) 0.4 MG SL tablet Place 1 tablet (0.4 mg total) under the tongue every 5 (five) minutes as needed for chest pain. 25 tablet 0   telmisartan  (MICARDIS ) 80 MG tablet Take 1 tablet (80 mg total) by mouth daily. 90 tablet 3   No current facility-administered medications on file prior to visit.   "

## 2024-05-28 NOTE — Assessment & Plan Note (Addendum)
 Not at goal  - Increased carvedilol  to 12.5 mg twice daily. Continue other bp medicines. [name]

## 2024-05-28 NOTE — Assessment & Plan Note (Addendum)
 Continued compliance and benefiting from the use of  CPAP.

## 2024-05-28 NOTE — Assessment & Plan Note (Addendum)
 Previously well controlled Continue Synthroid  at current dose  Managed with levothyroxine  137 mcg daily.

## 2024-05-28 NOTE — Assessment & Plan Note (Addendum)
 HBA1C was 7.1 - Encourage regular blood sugar monitoring. - Emphasize importance of diet and exercise to improve A1c. - Retry ozempic . Control: worsened  Recommend check feet daily. Recommend annual eye exams.  Orders:   POCT glycosylated hemoglobin (Hb A1C)   Semaglutide ,0.25 or 0.5MG /DOS, (OZEMPIC , 0.25 OR 0.5 MG/DOSE,) 2 MG/3ML SOPN; Inject 0.5 mg into the skin once a week.

## 2024-05-28 NOTE — Assessment & Plan Note (Addendum)
 Well-controlled. Follow-up with cardiology. Atherosclerotic heart disease managed with atorvastatin , telmisartan , and carvedilol .

## 2024-05-28 NOTE — Assessment & Plan Note (Addendum)
Recommend continue to work on eating healthy diet and exercise. ?Recommend weight loss.  ?

## 2024-05-28 NOTE — Telephone Encounter (Signed)
 Called and spoke with patient and answered his medications questions and clarification. Patient understood verbally.

## 2024-05-29 ENCOUNTER — Ambulatory Visit: Payer: Self-pay | Admitting: Family Medicine

## 2024-05-29 LAB — COMPREHENSIVE METABOLIC PANEL WITH GFR
ALT: 55 IU/L — ABNORMAL HIGH (ref 0–44)
AST: 34 IU/L (ref 0–40)
Albumin: 4.5 g/dL (ref 3.9–4.9)
Alkaline Phosphatase: 101 IU/L (ref 47–123)
BUN/Creatinine Ratio: 13 (ref 10–24)
BUN: 10 mg/dL (ref 8–27)
Bilirubin Total: 0.9 mg/dL (ref 0.0–1.2)
CO2: 21 mmol/L (ref 20–29)
Calcium: 9.1 mg/dL (ref 8.6–10.2)
Chloride: 100 mmol/L (ref 96–106)
Creatinine, Ser: 0.76 mg/dL (ref 0.76–1.27)
Globulin, Total: 2.1 g/dL (ref 1.5–4.5)
Glucose: 186 mg/dL — ABNORMAL HIGH (ref 70–99)
Potassium: 4.7 mmol/L (ref 3.5–5.2)
Sodium: 138 mmol/L (ref 134–144)
Total Protein: 6.6 g/dL (ref 6.0–8.5)
eGFR: 101 mL/min/1.73

## 2024-05-29 LAB — CBC WITH DIFFERENTIAL/PLATELET
Basophils Absolute: 0 x10E3/uL (ref 0.0–0.2)
Basos: 0 %
EOS (ABSOLUTE): 0.1 x10E3/uL (ref 0.0–0.4)
Eos: 1 %
Hematocrit: 45 % (ref 37.5–51.0)
Hemoglobin: 15.3 g/dL (ref 13.0–17.7)
Immature Grans (Abs): 0 x10E3/uL (ref 0.0–0.1)
Immature Granulocytes: 0 %
Lymphocytes Absolute: 1.9 x10E3/uL (ref 0.7–3.1)
Lymphs: 32 %
MCH: 31.4 pg (ref 26.6–33.0)
MCHC: 34 g/dL (ref 31.5–35.7)
MCV: 92 fL (ref 79–97)
Monocytes Absolute: 0.6 x10E3/uL (ref 0.1–0.9)
Monocytes: 11 %
Neutrophils Absolute: 3.2 x10E3/uL (ref 1.4–7.0)
Neutrophils: 55 %
Platelets: 188 x10E3/uL (ref 150–450)
RBC: 4.87 x10E6/uL (ref 4.14–5.80)
RDW: 13.1 % (ref 11.6–15.4)
WBC: 5.8 x10E3/uL (ref 3.4–10.8)

## 2024-05-29 LAB — TSH: TSH: 1.99 u[IU]/mL (ref 0.450–4.500)

## 2024-05-29 LAB — LIPID PANEL
Chol/HDL Ratio: 3 ratio (ref 0.0–5.0)
Cholesterol, Total: 104 mg/dL (ref 100–199)
HDL: 35 mg/dL — ABNORMAL LOW
LDL Chol Calc (NIH): 52 mg/dL (ref 0–99)
Triglycerides: 86 mg/dL (ref 0–149)
VLDL Cholesterol Cal: 17 mg/dL (ref 5–40)

## 2024-05-29 LAB — PSA: Prostate Specific Ag, Serum: 0.8 ng/mL (ref 0.0–4.0)

## 2024-06-25 NOTE — Progress Notes (Unsigned)
 "  Subjective:  Patient ID: Gerald Jordan, male    DOB: 06/26/1960  Age: 64 y.o. MRN: [id number]  No chief complaint on file.  HPI: Discussed the use of AI scribe software for clinical note transcription with the patient, who gave verbal consent to proceed.      Well Adult Physical: Patient here for a comprehensive physical exam.The patient reports {problems:16946} Do you take any herbs or supplements that were not prescribed by a doctor? {yes/no/not asked:9010} Are you taking calcium  supplements? {yes/no:63} Are you taking aspirin daily? {yes/no:63}  Encounter for general adult medical examination without abnormal findings  Physical (At Risk items are starred): Patient's last physical exam was 1 year ago .  Patient wears a seat belt, has smoke detectors, has carbon monoxide detectors, practices appropriate gun safety, and wears sunscreen with extended sun exposure. Dental Care: biannual cleanings, brushes and flosses daily. Ophthalmology/Optometry: Annual visit.  Hearing loss: none Vision impairments: none Last PSA:     02/26/2024    8:04 AM 11/14/2023    8:05 AM 10/17/2023   10:27 AM 08/10/2023    9:04 AM 05/08/2023    8:13 AM  Depression screen PHQ 2/9  Decreased Interest 0 0 0 0 0  Down, Depressed, Hopeless 0 0 0 0 0  PHQ - 2 Score 0 0 0 0 0  Altered sleeping 1 0  0 0  Tired, decreased energy 1 0  1 1  Change in appetite 1 0  0 1  Feeling bad or failure about yourself  0 0  0 0  Trouble concentrating 0 0  0 0  Moving slowly or fidgety/restless 0 0  0 0  Suicidal thoughts 0 0  0 0  PHQ-9 Score 3  0   1  2   Difficult doing work/chores Not difficult at all Not difficult at all  Not difficult at all Not difficult at all     Data saved with a previous flowsheet row definition         10/20/2022    8:50 AM 01/06/2023    7:30 AM 05/08/2023    8:13 AM 10/17/2023   10:27 AM 02/26/2024    8:04 AM  Fall Risk  Falls in the past year? 0 0 0 0 1  Was there an injury with  Fall? 0  0  0  0  1   Fall Risk Category Calculator 0 0 0 0 2  Patient at Risk for Falls Due to No Fall Risks No Fall Risks No Fall Risks No Fall Risks No Fall Risks  Fall risk Follow up Falls evaluation completed Falls evaluation completed;Follow up appointment Falls evaluation completed  Falls evaluation completed     Data saved with a previous flowsheet row definition              Past Medical History:  Diagnosis Date   Anxiety    Atherosclerotic heart disease of native coronary artery without angina pectoris 2019   Ventricular fibrillation - cardioverted x 3 in ambulance. 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   Type 2 diabetes mellitus (HCC)    Unspecified hearing loss, bilateral    Ventricular fibrillation [facility])    Past Surgical History:  Procedure Laterality Date   ANTERIOR CERVICAL DECOMP/DISCECTOMY FUSION N/A 02/17/2022   Procedure: ANTERIOR CERVICAL DECOMPRESSION/DISCECTOMY FUSION  CERVICAL FIVE-SEVEN;  Surgeon: Burnetta Aures, MD;  Location: [facility] OR;  Service: Orthopedics;  Laterality: N/A;   arm surgery  05/2014   CARDIAC CATHETERIZATION  2019   one stent placed   CHOLECYSTECTOMY  02/2016   COLONOSCOPY  2022   HERNIA REPAIR     1999-umbilical   NASAL SINUS SURGERY  2000   tendon repair 11/2014      Family History  Problem Relation Age of Onset   Breast cancer Mother    Melanoma Father    Diabetes Sister    Arrhythmia Brother    Congestive Heart Failure Brother    Heart  attack Brother    Atrial fibrillation Brother    Colon cancer Neg Hx    Stomach cancer Neg Hx    Esophageal cancer Neg Hx    Social History   Socioeconomic History   Marital status: Married    Spouse name: Not on file   Number of children: 1   Years of education: Not on file   Highest education level: Not on file  Occupational History   Occupation: production designer, theatre/television/film   Occupation: transport planner  Tobacco Use   Smoking status: Never   Smokeless tobacco: Never  Vaping Use   Vaping status: Never Used  Substance and Sexual Activity   Alcohol use: Not Currently   Drug use: No   Sexual activity: Yes    Partners: Female  Other Topics Concern   Not on file  Social History Narrative   Not on file   Social Drivers of Health   Tobacco Use: Low Risk (05/28/2024)   Patient History    Smoking Tobacco Use: Never    Smokeless Tobacco Use: Never    Passive Exposure: Not on file  Financial Resource Strain: Low Risk (02/03/2023)   Overall Financial Resource Strain (CARDIA)    Difficulty of Paying Living Expenses: Not hard at all  Food Insecurity: Low Risk (10/27/2023)   Received from [facility]   Epic    Within the past 12 months, you worried that your food would run out before you got money to buy more: Never true    Within the past 12 months, the food you bought just didn't last and you didn't have money to get more: Not on file  Transportation Needs: No Transportation Needs (11/14/2023)   Epic    Lack of Transportation (Medical): No    Lack of Transportation (Non-Medical): No  Physical Activity: Insufficiently Active (02/03/2023)   Exercise Vital Sign    Days of Exercise per Week: 2 days    Minutes of Exercise per Session: 40 min  Stress: No Stress Concern Present (02/03/2023)   [employer] of Occupational Health - Occupational Stress Questionnaire    Feeling of Stress : Not at all  Social Connections: Moderately Isolated (02/03/2023)   Social Connection and Isolation Panel     Frequency of Communication with Friends and Family: More than three times a week    Frequency of Social Gatherings with Friends and Family: More than three times a week  Attends Religious Services: Never    Active Member of Clubs or Organizations: No    Attends Banker Meetings: Never    Marital Status: Married  Depression (PHQ2-9): Low Risk (02/26/2024)   Depression (PHQ2-9)    PHQ-2 Score: 3  Alcohol Screen: Low Risk (06/10/2021)   Alcohol Screen    Last Alcohol Screening Score (AUDIT): 2  Housing: Low Risk (10/27/2023)   Received from [facility]   Epic    What is your living situation today?: I have a steady place to live    Think about the place you live. Do you have problems with any of the following? Choose all that apply:: Not on file  Utilities: Not At Risk (11/14/2023)   Epic    Threatened with loss of utilities: No  Health Literacy: Adequate Health Literacy (02/03/2023)   B1300 Health Literacy    Frequency of need for help with medical instructions: Never   Review of Systems  Constitutional:  Negative for appetite change, fatigue and fever.  HENT:  Negative for congestion, ear pain, sinus pressure and sore throat.   Respiratory:  Negative for cough, chest tightness, shortness of breath and wheezing.   Cardiovascular:  Negative for chest pain and palpitations.  Gastrointestinal:  Negative for abdominal pain, constipation, diarrhea, nausea and vomiting.  Genitourinary:  Negative for dysuria and hematuria.  Musculoskeletal:  Negative for arthralgias, back pain, joint swelling and myalgias.  Skin:  Negative for rash.  Neurological:  Negative for dizziness, weakness and headaches.  Psychiatric/Behavioral:  Negative for dysphoric mood. The patient is not nervous/anxious.      Objective:  There were no vitals taken for this visit.     05/28/2024    9:09 AM 05/28/2024    8:18 AM 02/26/2024    8:02 AM  BP/Weight  Systolic BP 142 152 132  Diastolic BP 78 80  70  Wt. (Lbs)  277.3 276  BMI  40.95 kg/m2 40.76 kg/m2    Physical Exam  Lab Results  Component Value Date   WBC 5.8 05/28/2024   HGB 15.3 05/28/2024   HCT 45.0 05/28/2024   PLT 188 05/28/2024   GLUCOSE 186 (H) 05/28/2024   CHOL 104 05/28/2024   TRIG 86 05/28/2024   HDL 35 (L) 05/28/2024   LDLCALC 52 05/28/2024   ALT 55 (H) 05/28/2024   AST 34 05/28/2024   NA 138 05/28/2024   K 4.7 05/28/2024   CL 100 05/28/2024   CREATININE 0.76 05/28/2024   BUN 10 05/28/2024   CO2 21 05/28/2024   TSH 1.990 05/28/2024   INR 1.2 (H) 09/13/2023   HGBA1C 7.1 05/28/2024   MICROALBUR 30 07/29/2020      Assessment & Plan:   Assessment & Plan Encounter for general adult medical examination without abnormal findings        There is no height or weight on file to calculate BMI.   These are the goals we discussed:  Goals   None      This is a list of the screening recommended for you and due dates:  Health Maintenance  Topic Date Due   Kidney health urinalysis for diabetes  08/09/2024   Eye exam for diabetics  10/23/2024   Hemoglobin A1C  11/26/2024   Yearly kidney function blood test for diabetes  05/28/2025   Complete foot exam   05/28/2025   DTaP/Tdap/Td vaccine (5 - Td or Tdap) 01/21/2026   Colon Cancer Screening  12/31/2030   Pneumococcal Vaccine for  age over 86  Completed   Flu Shot  Completed   HPV Vaccine (No Doses Required) Completed   Zoster (Shingles) Vaccine  Completed   Hepatitis B Vaccine  Aged Out   Meningitis B Vaccine  Aged Out   COVID-19 Vaccine  Discontinued   Hepatitis C Screening  Discontinued   HIV Screening  Discontinued     No orders of the defined types were placed in this encounter.    Follow-up: No follow-ups on file.  An After Visit Summary was printed and given to the patient.   I,Lauren M Auman,acting as a neurosurgeon for [employer], [employer].,have documented all relevant documentation on the behalf of Nola Angles, PA,as directed by  Nola Angles, PA while in the presence of Nola Angles, [place].    Nola Angles, [place] [facility] [phone]  "

## 2024-06-26 ENCOUNTER — Ambulatory Visit: Admitting: Physician Assistant

## 2024-06-26 ENCOUNTER — Encounter: Payer: Self-pay | Admitting: Physician Assistant

## 2024-06-26 VITALS — BP 138/74 | HR 56 | Temp 97.9°F | Ht 69.0 in | Wt 271.0 lb

## 2024-06-26 DIAGNOSIS — Z0000 Encounter for general adult medical examination without abnormal findings: Secondary | ICD-10-CM | POA: Diagnosis not present

## 2024-06-26 DIAGNOSIS — E1121 Type 2 diabetes mellitus with diabetic nephropathy: Secondary | ICD-10-CM | POA: Diagnosis not present

## 2024-06-26 DIAGNOSIS — H4010X0 Unspecified open-angle glaucoma, stage unspecified: Secondary | ICD-10-CM | POA: Diagnosis not present

## 2024-06-26 DIAGNOSIS — Z7985 Long-term (current) use of injectable non-insulin antidiabetic drugs: Secondary | ICD-10-CM | POA: Diagnosis not present

## 2024-06-26 DIAGNOSIS — H409 Unspecified glaucoma: Secondary | ICD-10-CM | POA: Insufficient documentation

## 2024-06-26 DIAGNOSIS — I119 Hypertensive heart disease without heart failure: Secondary | ICD-10-CM

## 2024-06-26 MED ORDER — CARVEDILOL 12.5 MG PO TABS: 12.5000 mg | ORAL_TABLET | Freq: Two times a day (BID) | ORAL | 3 refills | Status: AC

## 2024-06-26 MED ORDER — OZEMPIC (0.25 OR 0.5 MG/DOSE) 2 MG/3ML ~~LOC~~ SOPN: 0.5000 mg | PEN_INJECTOR | SUBCUTANEOUS | 0 refills | Status: AC

## 2024-06-26 NOTE — Assessment & Plan Note (Signed)
 Hypertensive heart disease without heart failure Blood pressure controlled with carvedilol . Recent systolic reading decreased to 861 mmHg. - Continue carvedilol  12.5 mg, one tablet in the morning and one at night. - Sent prescription to mail order pharmacy. Orders:   carvedilol  (COREG ) 12.5 MG tablet; Take 1 tablet (12.5 mg total) by mouth 2 (two) times daily with a meal.

## 2024-06-26 NOTE — Assessment & Plan Note (Signed)
 Type 2 diabetes mellitus Managed with Ozempic  0.25 mg. No side effects. Plan to increase dose as tolerated. - Increased Ozempic  to 0.5 mg. - Continue stool softener as needed for constipation. - Monitor for side effects and consider increasing dose to 1 mg if tolerated after one month. Orders:   Semaglutide ,0.25 or 0.5MG /DOS, (OZEMPIC , 0.25 OR 0.5 MG/DOSE,) 2 MG/3ML SOPN; Inject 0.5 mg into the skin once a week.

## 2024-06-26 NOTE — Assessment & Plan Note (Signed)
 Primary open-angle glaucoma Well-managed with latanoprost . Pressure has reduced and being closely monitored. - Continue latanoprost  one drop daily.

## 2024-07-03 ENCOUNTER — Telehealth: Payer: Self-pay

## 2024-07-03 ENCOUNTER — Other Ambulatory Visit (HOSPITAL_COMMUNITY): Payer: Self-pay

## 2024-07-03 NOTE — Telephone Encounter (Signed)
 Pharmacy Patient Advocate Encounter  Received notification from [facility] that Prior Authorization for Ozempic  has been APPROVED from 07/03/2024 to 07/03/2025   PA #/Case ID/Reference #: [id number]

## 2024-07-03 NOTE — Telephone Encounter (Signed)
 Pharmacy Patient Advocate Encounter   Received notification from [facility] that prior authorization for Ozempic  is required/requested.   Insurance verification completed.   The patient is insured through [employer].   Per test claim: PA required; PA submitted to above mentioned insurance via Latent Key/confirmation #/EOC [id number] Status is pending

## 2024-10-30 ENCOUNTER — Ambulatory Visit: Admitting: Family Medicine
